# Patient Record
Sex: Male | Born: 1945 | Race: White | Hispanic: No
Health system: Southern US, Community
[De-identification: ages and names within clinical notes are randomized; demographics above are authoritative.]

## PROBLEM LIST (undated history)

## (undated) DIAGNOSIS — C801 Malignant (primary) neoplasm, unspecified: Secondary | ICD-10-CM

## (undated) HISTORY — PX: TOTAL HIP ARTHROPLASTY: SHX124

## (undated) HISTORY — PX: BRAIN SURGERY: SHX531

---

## 2022-03-01 ENCOUNTER — Encounter (HOSPITAL_BASED_OUTPATIENT_CLINIC_OR_DEPARTMENT_OTHER): Payer: Self-pay

## 2022-03-01 ENCOUNTER — Other Ambulatory Visit: Payer: Self-pay

## 2022-03-01 ENCOUNTER — Emergency Department (HOSPITAL_BASED_OUTPATIENT_CLINIC_OR_DEPARTMENT_OTHER)
Admission: EM | Admit: 2022-03-01 | Discharge: 2022-03-02 | Disposition: A | Discharge status: Home / Self Care | Payer: Medicare HMO | Attending: Emergency Medicine | Admitting: Emergency Medicine

## 2022-03-01 DIAGNOSIS — Y9389 Activity, other specified: Secondary | ICD-10-CM | POA: Insufficient documentation

## 2022-03-01 DIAGNOSIS — Y92009 Unspecified place in unspecified non-institutional (private) residence as the place of occurrence of the external cause: Secondary | ICD-10-CM | POA: Insufficient documentation

## 2022-03-01 DIAGNOSIS — S01111A Laceration without foreign body of right eyelid and periocular area, initial encounter: Secondary | ICD-10-CM | POA: Insufficient documentation

## 2022-03-01 DIAGNOSIS — S0990XA Unspecified injury of head, initial encounter: Secondary | ICD-10-CM | POA: Insufficient documentation

## 2022-03-01 DIAGNOSIS — S0591XA Unspecified injury of right eye and orbit, initial encounter: Secondary | ICD-10-CM | POA: Diagnosis present

## 2022-03-01 DIAGNOSIS — Z5321 Procedure and treatment not carried out due to patient leaving prior to being seen by health care provider: Secondary | ICD-10-CM | POA: Insufficient documentation

## 2022-03-01 DIAGNOSIS — W01198A Fall on same level from slipping, tripping and stumbling with subsequent striking against other object, initial encounter: Secondary | ICD-10-CM | POA: Insufficient documentation

## 2022-03-01 HISTORY — DX: Malignant (primary) neoplasm, unspecified: C80.1

## 2022-03-01 NOTE — ED Triage Notes (Signed)
Pt presents to the ED POV from home. States that he was taking the trash out at home when he tripped and fell. Reports hitting his head on the trash can. Small lac above right eye. Bleeding controlled at this time. No LOC. No use of blood thinners. Pt A&Ox4 at time of triage. VSS. Pupils round, reactive and equal. ?

## 2022-03-04 ENCOUNTER — Encounter (HOSPITAL_BASED_OUTPATIENT_CLINIC_OR_DEPARTMENT_OTHER): Payer: Self-pay

## 2022-03-04 ENCOUNTER — Other Ambulatory Visit: Payer: Self-pay

## 2022-03-04 ENCOUNTER — Emergency Department (HOSPITAL_BASED_OUTPATIENT_CLINIC_OR_DEPARTMENT_OTHER): Payer: Medicare HMO

## 2022-03-04 ENCOUNTER — Emergency Department (HOSPITAL_BASED_OUTPATIENT_CLINIC_OR_DEPARTMENT_OTHER)
Admission: EM | Admit: 2022-03-04 | Discharge: 2022-03-04 | Disposition: A | Discharge status: Home / Self Care | Payer: Medicare HMO | Attending: Emergency Medicine | Admitting: Emergency Medicine

## 2022-03-04 DIAGNOSIS — Z85118 Personal history of other malignant neoplasm of bronchus and lung: Secondary | ICD-10-CM | POA: Insufficient documentation

## 2022-03-04 DIAGNOSIS — Z79899 Other long term (current) drug therapy: Secondary | ICD-10-CM | POA: Insufficient documentation

## 2022-03-04 DIAGNOSIS — I10 Essential (primary) hypertension: Secondary | ICD-10-CM | POA: Diagnosis not present

## 2022-03-04 DIAGNOSIS — T07XXXA Unspecified multiple injuries, initial encounter: Secondary | ICD-10-CM

## 2022-03-04 DIAGNOSIS — S0083XA Contusion of other part of head, initial encounter: Secondary | ICD-10-CM | POA: Diagnosis not present

## 2022-03-04 DIAGNOSIS — R251 Tremor, unspecified: Secondary | ICD-10-CM | POA: Insufficient documentation

## 2022-03-04 DIAGNOSIS — Z85528 Personal history of other malignant neoplasm of kidney: Secondary | ICD-10-CM | POA: Insufficient documentation

## 2022-03-04 DIAGNOSIS — T148XXA Other injury of unspecified body region, initial encounter: Secondary | ICD-10-CM

## 2022-03-04 DIAGNOSIS — S0990XA Unspecified injury of head, initial encounter: Secondary | ICD-10-CM | POA: Diagnosis present

## 2022-03-04 DIAGNOSIS — S41111A Laceration without foreign body of right upper arm, initial encounter: Secondary | ICD-10-CM | POA: Diagnosis not present

## 2022-03-04 DIAGNOSIS — W01198A Fall on same level from slipping, tripping and stumbling with subsequent striking against other object, initial encounter: Secondary | ICD-10-CM | POA: Insufficient documentation

## 2022-03-04 MED ORDER — BACITRACIN ZINC 500 UNIT/GM EX OINT
TOPICAL_OINTMENT | Freq: Two times a day (BID) | CUTANEOUS | Status: DC
  Administered 2022-03-04: 1 via TOPICAL
  Filled 2022-03-04: qty 28.35

## 2022-03-04 NOTE — ED Provider Notes (Signed)
?[facility] ?Provider Note ? ? ?CSN: [id number] ?Arrival date & time: 03/04/22  2004 ? ?  ? ?History ? ?Chief Complaint  ?Patient presents with  ? Head Injury  ? ? ?Chad Winters is a 76 y.o. male. ? 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  Pt said he fell in the driveway on Sunday, 9/56 while he was taking out the trash.  He hit the right side of his face and also hit his right arm.  Pt did come to the ED on the 19th, but he left without being seen because the wait was too long.  Pt is not on blood thinners.   ? ? ?  ? ?Home Medications ?Prior to Admission medications   ?Not on File  ?   ?Current Outpatient Medications  ?Medication Sig Dispense Refill  ? acetaminophen (TYLENOL) 500 MG tablet Take 1 tablet (500 mg total) by mouth every 6 (six) hours as needed for Pain.  ? adalimumab (HUMIRA PEN) 40 mg/0.8 mL injection (pen kit) INJECT 40MG (1 PEN) SUBCUTANEOUSLY EVERY OTHER WEEK  ? allopurinoL (ZYLOPRIM) 100 MG tablet Take 1 tablet (100 mg total) by mouth daily. 90 tablet 3  ? amLODIPine (NORVASC) 10 MG tablet Take 1 tablet (10 mg total) by mouth daily. 90 tablet 2  ? atorvastatin (LIPITOR) 40 MG tablet Take 1 tablet (40 mg total) by mouth daily. 90 tablet 3  ? CHOLECALCIFEROL, VITAMIN D3, ORAL Take 2,000 Units by mouth daily.  ? cyanocobalamin (VITAMIN B12) 1000 MCG tablet Take 1 tablet (1,000 mcg total) by mouth daily.  ? DULoxetine (CYMBALTA) 20 MG DR capsule Take 2 capsules (40 mg total) by mouth daily. Start 1 cap for 2 weeks, increase to 2 ongoing if no improvement in headaches 60 capsule 3  ? finasteride (PROSCAR) 5 mg tablet Take 1 tablet (5 mg total) by mouth daily. 90 tablet 3  ? loperamide (IMODIUM-AD) 2 mg capsule TAKE 1 CAPSULE(2 MG) BY MOUTH FOUR TIMES DAILY AS NEEDED FOR DIARRHEA 150 capsule 3  ? ondansetron  (ZOFRAN-ODT) 4 MG disintegrating tablet Dissolve 1 tablet (4 mg total) by mouth every 8 (eight) hours as needed for Nausea. 20 tablet 5  ? topiramate (TOPAMAX) 50 MG tablet Take 1 tablet (50 mg total) by mouth 2 times daily.  ? potassium chloride ER (KLOR-CON-M, K-DUR) 20 MEQ extended release tablet Take 1 tablet (20 mEq total) by mouth daily for 7 days. 7 tablet 0  ?Allergies    ?Ciprofloxacin, Codeine, and Lisinopril   ? ?Review of Systems   ?Review of Systems  ?HENT:  Positive for facial swelling.   ?Skin:  Positive for wound.  ?All other systems reviewed and are negative. ? ?Physical Exam ?Updated Vital Signs ?BP 136/81   Pulse 60   Temp 97.9 ?F (36.6 ?C) (Oral)   Resp 18   Ht 5' 8"  (1.727 m)   Wt 62.1 kg   SpO2 100%   BMI 20.82 kg/m?  ?Physical Exam ?Vitals and nursing note reviewed.  ?Constitutional:   ?   Appearance: Normal appearance.  ?HENT:  ?   Head:  ?   Comments: Right forehead abrasions ?Right face contusions and swelling ? ?   Right Ear: External ear normal.  ?   Nose: Nose normal.  ?   Mouth/Throat:  ?   Mouth: Mucous membranes  are moist.  ?   Pharynx: Oropharynx is clear.  ?Eyes:  ?   Extraocular Movements: Extraocular movements intact.  ?   Conjunctiva/sclera: Conjunctivae normal.  ?   Pupils: Pupils are equal, round, and reactive to light.  ?Cardiovascular:  ?   Rate and Rhythm: Normal rate and regular rhythm.  ?   Pulses: Normal pulses.  ?   Heart sounds: Normal heart sounds.  ?Pulmonary:  ?   Effort: Pulmonary effort is normal.  ?   Breath sounds: Normal breath sounds.  ?Abdominal:  ?   General: Abdomen is flat. Bowel sounds are normal.  ?   Palpations: Abdomen is soft.  ?Musculoskeletal:     ?   General: Normal range of motion.  ?   Cervical back: Normal range of motion and neck supple.  ?Skin: ?   Capillary Refill: Capillary refill takes less than 2 seconds.  ?   Comments: 2 skin tears on right arm.  Healing and not infected.  ?Neurological:  ?   General: No focal deficit present.  ?    Mental Status: He is alert and oriented to person, place, and time.  ?   Motor: Tremor present.  ?   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   Mood and Affect: Mood normal.     ?   Behavior: Behavior normal.  ? ? ?ED Results / Procedures / Treatments   ?Labs ?(all labs ordered are listed, but only abnormal results are displayed) ?Labs Reviewed - No data to display ? ?EKG ?None ? ?Radiology ?CT Head Wo Contrast ? ?Result Date: 03/04/2022 ?CLINICAL DATA:  [place].  Facial bruising. EXAM: CT HEAD WITHOUT CONTRAST CT MAXILLOFACIAL WITHOUT CONTRAST CT CERVICAL SPINE WITHOUT CONTRAST TECHNIQUE: Multidetector CT imaging of the head, cervical spine, and maxillofacial structures were performed using the standard protocol without intravenous contrast. Multiplanar CT image reconstructions of the cervical spine and maxillofacial structures were also generated. RADIATION DOSE REDUCTION: This exam was performed according to the departmental dose-optimization program which includes automated exposure control, adjustment of the mA and/or kV according to patient size and/or use of iterative reconstruction technique. COMPARISON:  CT head 10/03/2021. FINDINGS: CT HEAD FINDINGS Brain: No evidence of acute infarction, hemorrhage, hydrocephalus, extra-axial collection or mass lesion/mass effect. Low-density extra-axial fluid collection in the right frontal region measuring 1 cm appears unchanged from prior. Small area of encephalomalacia in the right frontal lobe is also unchanged. There is mild diffuse atrophy. Vascular: Atherosclerotic calcifications are present within the cavernous internal carotid arteries. Skull: Old bilateral frontoparietal burr holes are present. No acute fractures are seen. Other: None. CT MAXILLOFACIAL FINDINGS Osseous: No fracture or mandibular dislocation. No destructive process. Orbits: Negative. No traumatic or inflammatory finding. Sinuses: Right mastoidectomy changes are  again noted. There is opacification residual right mastoid air cells, unchanged. Left mastoid air cells clear. Paranasal sinuses are clear. Soft tissues: There is mild soft tissue swelling and edema of the right face. There is also soft tissue swelling and edema in the right forehead and right periorbital region. There is no radiopaque foreign body. CT CERVICAL SPINE FINDINGS Alignment: Normal. Skull base and vertebrae: No acute fracture. No primary bone lesion or focal pathologic process. Soft tissues and spinal canal: No prevertebral fluid or swelling. No visible canal hematoma. Disc levels: There is disc space narrowing from C3 through C7 compatible with degenerative change. There is no severe central canal or neural foraminal stenosis identified. Upper chest:  Negative. Other: None. IMPRESSION: 1. No acute intracranial process. 2. No acute facial fracture. 3. Right facial soft tissue swelling and edema. 4. No acute fracture or malalignment of the cervical spine. 5. Unchanged small low-density extra-axial fluid collection overlying the right cerebral convexity may represent subdural hygroma. Electronically Signed   By: Ronney Asters M.D.   On: 03/04/2022 22:30  ? ?CT Cervical Spine Wo Contrast ? ?Result Date: 03/04/2022 ?CLINICAL DATA:  [place].  Facial bruising. EXAM: CT HEAD WITHOUT CONTRAST CT MAXILLOFACIAL WITHOUT CONTRAST CT CERVICAL SPINE WITHOUT CONTRAST TECHNIQUE: Multidetector CT imaging of the head, cervical spine, and maxillofacial structures were performed using the standard protocol without intravenous contrast. Multiplanar CT image reconstructions of the cervical spine and maxillofacial structures were also generated. RADIATION DOSE REDUCTION: This exam was performed according to the departmental dose-optimization program which includes automated exposure control, adjustment of the mA and/or kV according to patient size and/or use of iterative reconstruction technique. COMPARISON:  CT head 10/03/2021.  FINDINGS: CT HEAD FINDINGS Brain: No evidence of acute infarction, hemorrhage, hydrocephalus, extra-axial collection or mass lesion/mass effect. Low-density extra-axial fluid collection in the right frontal region measurin

## 2022-03-04 NOTE — ED Notes (Signed)
Patient given discharge instructions. Questions were answered. Patient verbalized understanding of discharge instructions and care at home. ? ?Patient discharged at this time.  ?

## 2022-03-04 NOTE — ED Notes (Signed)
Patient abrasion sites to arm and head cleansed with saline, covered with bacitracin, and covered in gauzed. Arm sites wrapped with coban.  ?

## 2022-03-04 NOTE — ED Triage Notes (Signed)
Patient here POV from Home with Head Injury.  ? ?Patient fell on Sunday when he was taking the Trash out. ? ?Abrasion noted to Right Forehead and Bruising noted to Facial Area. ? ?Patient was here Sunday but LWBS due to Wait. No LOC. No Anticoagulants.  ? ?NAD Noted during Triage. A&Ox4. GCS 15. Ambulatory. ?

## 2022-12-15 DIAGNOSIS — Z961 Presence of intraocular lens: Secondary | ICD-10-CM | POA: Diagnosis not present

## 2022-12-15 DIAGNOSIS — Z9841 Cataract extraction status, right eye: Secondary | ICD-10-CM | POA: Diagnosis not present

## 2022-12-15 DIAGNOSIS — G510 Bell's palsy: Secondary | ICD-10-CM | POA: Diagnosis not present

## 2022-12-15 DIAGNOSIS — H20023 Recurrent acute iridocyclitis, bilateral: Secondary | ICD-10-CM | POA: Diagnosis not present

## 2022-12-15 DIAGNOSIS — H25812 Combined forms of age-related cataract, left eye: Secondary | ICD-10-CM | POA: Diagnosis not present

## 2022-12-15 DIAGNOSIS — H35372 Puckering of macula, left eye: Secondary | ICD-10-CM | POA: Diagnosis not present

## 2022-12-16 ENCOUNTER — Other Ambulatory Visit: Payer: Self-pay

## 2022-12-16 ENCOUNTER — Emergency Department (HOSPITAL_COMMUNITY): Payer: Medicare HMO

## 2022-12-16 ENCOUNTER — Encounter (HOSPITAL_COMMUNITY)
Admission: EM | Disposition: A | Discharge status: Home Health | Payer: Self-pay | Source: Home / Self Care | Attending: Internal Medicine

## 2022-12-16 ENCOUNTER — Encounter (HOSPITAL_COMMUNITY): Payer: Self-pay | Admitting: Internal Medicine

## 2022-12-16 ENCOUNTER — Inpatient Hospital Stay (HOSPITAL_COMMUNITY): Payer: Medicare HMO

## 2022-12-16 ENCOUNTER — Inpatient Hospital Stay (HOSPITAL_COMMUNITY): Payer: Medicare HMO | Admitting: Anesthesiology

## 2022-12-16 ENCOUNTER — Inpatient Hospital Stay (HOSPITAL_COMMUNITY)
Admission: EM | Admit: 2022-12-16 | Discharge: 2022-12-20 | DRG: 522 | Disposition: A | Discharge status: Home Health | Payer: Medicare HMO | Attending: Internal Medicine | Admitting: Internal Medicine

## 2022-12-16 DIAGNOSIS — I129 Hypertensive chronic kidney disease with stage 1 through stage 4 chronic kidney disease, or unspecified chronic kidney disease: Secondary | ICD-10-CM | POA: Diagnosis present

## 2022-12-16 DIAGNOSIS — N179 Acute kidney failure, unspecified: Secondary | ICD-10-CM | POA: Diagnosis not present

## 2022-12-16 DIAGNOSIS — S72001A Fracture of unspecified part of neck of right femur, initial encounter for closed fracture: Secondary | ICD-10-CM

## 2022-12-16 DIAGNOSIS — M450 Ankylosing spondylitis of multiple sites in spine: Secondary | ICD-10-CM

## 2022-12-16 DIAGNOSIS — W1830XA Fall on same level, unspecified, initial encounter: Secondary | ICD-10-CM | POA: Diagnosis present

## 2022-12-16 DIAGNOSIS — Y92008 Other place in unspecified non-institutional (private) residence as the place of occurrence of the external cause: Secondary | ICD-10-CM | POA: Diagnosis not present

## 2022-12-16 DIAGNOSIS — R531 Weakness: Secondary | ICD-10-CM | POA: Diagnosis not present

## 2022-12-16 DIAGNOSIS — Z85528 Personal history of other malignant neoplasm of kidney: Secondary | ICD-10-CM | POA: Diagnosis not present

## 2022-12-16 DIAGNOSIS — K7581 Nonalcoholic steatohepatitis (NASH): Secondary | ICD-10-CM | POA: Diagnosis present

## 2022-12-16 DIAGNOSIS — Z885 Allergy status to narcotic agent status: Secondary | ICD-10-CM

## 2022-12-16 DIAGNOSIS — E876 Hypokalemia: Secondary | ICD-10-CM | POA: Diagnosis not present

## 2022-12-16 DIAGNOSIS — M25551 Pain in right hip: Secondary | ICD-10-CM | POA: Diagnosis not present

## 2022-12-16 DIAGNOSIS — S72041A Displaced fracture of base of neck of right femur, initial encounter for closed fracture: Secondary | ICD-10-CM

## 2022-12-16 DIAGNOSIS — M109 Gout, unspecified: Secondary | ICD-10-CM | POA: Diagnosis present

## 2022-12-16 DIAGNOSIS — Z888 Allergy status to other drugs, medicaments and biological substances status: Secondary | ICD-10-CM

## 2022-12-16 DIAGNOSIS — Z96641 Presence of right artificial hip joint: Secondary | ICD-10-CM | POA: Diagnosis not present

## 2022-12-16 DIAGNOSIS — Z79620 Long term (current) use of immunosuppressive biologic: Secondary | ICD-10-CM

## 2022-12-16 DIAGNOSIS — F17210 Nicotine dependence, cigarettes, uncomplicated: Secondary | ICD-10-CM | POA: Diagnosis not present

## 2022-12-16 DIAGNOSIS — F17290 Nicotine dependence, other tobacco product, uncomplicated: Secondary | ICD-10-CM | POA: Diagnosis present

## 2022-12-16 DIAGNOSIS — Z96642 Presence of left artificial hip joint: Secondary | ICD-10-CM | POA: Diagnosis present

## 2022-12-16 DIAGNOSIS — E871 Hypo-osmolality and hyponatremia: Secondary | ICD-10-CM | POA: Diagnosis not present

## 2022-12-16 DIAGNOSIS — I10 Essential (primary) hypertension: Secondary | ICD-10-CM | POA: Insufficient documentation

## 2022-12-16 DIAGNOSIS — T433X5A Adverse effect of phenothiazine antipsychotics and neuroleptics, initial encounter: Secondary | ICD-10-CM | POA: Diagnosis present

## 2022-12-16 DIAGNOSIS — S72009A Fracture of unspecified part of neck of unspecified femur, initial encounter for closed fracture: Secondary | ICD-10-CM | POA: Insufficient documentation

## 2022-12-16 DIAGNOSIS — N1832 Chronic kidney disease, stage 3b: Secondary | ICD-10-CM | POA: Diagnosis not present

## 2022-12-16 DIAGNOSIS — G2119 Other drug induced secondary parkinsonism: Secondary | ICD-10-CM | POA: Diagnosis present

## 2022-12-16 DIAGNOSIS — Z881 Allergy status to other antibiotic agents status: Secondary | ICD-10-CM | POA: Diagnosis not present

## 2022-12-16 DIAGNOSIS — E785 Hyperlipidemia, unspecified: Secondary | ICD-10-CM | POA: Diagnosis not present

## 2022-12-16 DIAGNOSIS — Z471 Aftercare following joint replacement surgery: Secondary | ICD-10-CM | POA: Diagnosis not present

## 2022-12-16 DIAGNOSIS — D849 Immunodeficiency, unspecified: Secondary | ICD-10-CM | POA: Diagnosis not present

## 2022-12-16 DIAGNOSIS — S72031A Displaced midcervical fracture of right femur, initial encounter for closed fracture: Secondary | ICD-10-CM | POA: Diagnosis not present

## 2022-12-16 DIAGNOSIS — M459 Ankylosing spondylitis of unspecified sites in spine: Secondary | ICD-10-CM

## 2022-12-16 DIAGNOSIS — Z8669 Personal history of other diseases of the nervous system and sense organs: Secondary | ICD-10-CM

## 2022-12-16 DIAGNOSIS — F17200 Nicotine dependence, unspecified, uncomplicated: Secondary | ICD-10-CM | POA: Diagnosis not present

## 2022-12-16 DIAGNOSIS — Z01818 Encounter for other preprocedural examination: Secondary | ICD-10-CM | POA: Diagnosis not present

## 2022-12-16 DIAGNOSIS — Z905 Acquired absence of kidney: Secondary | ICD-10-CM

## 2022-12-16 DIAGNOSIS — M1990 Unspecified osteoarthritis, unspecified site: Secondary | ICD-10-CM

## 2022-12-16 DIAGNOSIS — W19XXXA Unspecified fall, initial encounter: Secondary | ICD-10-CM | POA: Diagnosis not present

## 2022-12-16 DIAGNOSIS — M25572 Pain in left ankle and joints of left foot: Secondary | ICD-10-CM | POA: Diagnosis not present

## 2022-12-16 DIAGNOSIS — M954 Acquired deformity of chest and rib: Secondary | ICD-10-CM | POA: Diagnosis not present

## 2022-12-16 HISTORY — PX: TOTAL HIP ARTHROPLASTY: SHX124

## 2022-12-16 LAB — CBC WITH DIFFERENTIAL/PLATELET
Abs Immature Granulocytes: 0.15 10*3/uL — ABNORMAL HIGH (ref 0.00–0.07)
Basophils Absolute: 0 10*3/uL (ref 0.0–0.1)
Basophils Relative: 0 %
Eosinophils Absolute: 0 10*3/uL (ref 0.0–0.5)
Eosinophils Relative: 0 %
HCT: 36.1 % — ABNORMAL LOW (ref 39.0–52.0)
Hemoglobin: 12.2 g/dL — ABNORMAL LOW (ref 13.0–17.0)
Immature Granulocytes: 1 %
Lymphocytes Relative: 8 %
Lymphs Abs: 0.9 10*3/uL (ref 0.7–4.0)
MCH: 31.9 pg (ref 26.0–34.0)
MCHC: 33.8 g/dL (ref 30.0–36.0)
MCV: 94.3 fL (ref 80.0–100.0)
Monocytes Absolute: 0.9 10*3/uL (ref 0.1–1.0)
Monocytes Relative: 8 %
Neutro Abs: 8.7 10*3/uL — ABNORMAL HIGH (ref 1.7–7.7)
Neutrophils Relative %: 83 %
Platelets: 341 10*3/uL (ref 150–400)
RBC: 3.83 MIL/uL — ABNORMAL LOW (ref 4.22–5.81)
RDW: 14.6 % (ref 11.5–15.5)
WBC: 10.6 10*3/uL — ABNORMAL HIGH (ref 4.0–10.5)
nRBC: 0 % (ref 0.0–0.2)

## 2022-12-16 LAB — MAGNESIUM: Magnesium: 1.7 mg/dL (ref 1.7–2.4)

## 2022-12-16 LAB — POCT I-STAT, CHEM 8
BUN: 16 mg/dL (ref 8–23)
Calcium, Ion: 1.17 mmol/L (ref 1.15–1.40)
Chloride: 103 mmol/L (ref 98–111)
Creatinine, Ser: 1 mg/dL (ref 0.61–1.24)
Glucose, Bld: 98 mg/dL (ref 70–99)
HCT: 37 % — ABNORMAL LOW (ref 39.0–52.0)
Hemoglobin: 12.6 g/dL — ABNORMAL LOW (ref 13.0–17.0)
Potassium: 3.3 mmol/L — ABNORMAL LOW (ref 3.5–5.1)
Sodium: 139 mmol/L (ref 135–145)
TCO2: 21 mmol/L — ABNORMAL LOW (ref 22–32)

## 2022-12-16 LAB — BASIC METABOLIC PANEL
Anion gap: 10 (ref 5–15)
BUN: 19 mg/dL (ref 8–23)
CO2: 23 mmol/L (ref 22–32)
Calcium: 8.3 mg/dL — ABNORMAL LOW (ref 8.9–10.3)
Chloride: 103 mmol/L (ref 98–111)
Creatinine, Ser: 1.17 mg/dL (ref 0.61–1.24)
GFR, Estimated: >60 mL/min (ref >60)
Glucose, Bld: 110 mg/dL — ABNORMAL HIGH (ref 70–99)
Potassium: 2.8 mmol/L — ABNORMAL LOW (ref 3.5–5.1)
Sodium: 136 mmol/L (ref 135–145)

## 2022-12-16 LAB — SURGICAL PCR SCREEN
MRSA, PCR: NEGATIVE
Staphylococcus aureus: NEGATIVE

## 2022-12-16 LAB — TYPE AND SCREEN
ABO/RH(D): A POS
Antibody Screen: NEGATIVE

## 2022-12-16 LAB — ABO/RH: ABO/RH(D): A POS

## 2022-12-16 SURGERY — ARTHROPLASTY, HIP, TOTAL, ANTERIOR APPROACH
Anesthesia: General | Site: Hip | Laterality: Right

## 2022-12-16 MED ORDER — PHENOL 1.4 % MT LIQD: 1.0000 | OROMUCOSAL | Status: DC | PRN

## 2022-12-16 MED ORDER — ACETAMINOPHEN 10 MG/ML IV SOLN: 1000.0000 mg | Freq: Once | INTRAVENOUS | Status: DC | PRN

## 2022-12-16 MED ORDER — SODIUM CHLORIDE 0.9 % IR SOLN
Status: DC | PRN
  Administered 2022-12-16: 3000 mL

## 2022-12-16 MED ORDER — HYDRALAZINE HCL 20 MG/ML IJ SOLN
5.0000 mg | Freq: Three times a day (TID) | INTRAMUSCULAR | Status: DC | PRN
  Filled 2022-12-16: qty 0.25

## 2022-12-16 MED ORDER — OXYCODONE HCL 5 MG PO TABS
5.0000 mg | ORAL_TABLET | ORAL | Status: DC | PRN
  Administered 2022-12-17 – 2022-12-18 (×5): 10 mg via ORAL
  Administered 2022-12-18: 5 mg via ORAL
  Administered 2022-12-18 – 2022-12-19 (×2): 10 mg via ORAL
  Administered 2022-12-19: 5 mg via ORAL
  Administered 2022-12-19: 10 mg via ORAL
  Administered 2022-12-19: 5 mg via ORAL
  Administered 2022-12-20 (×2): 10 mg via ORAL
  Filled 2022-12-16 (×2): qty 1
  Filled 2022-12-16 (×8): qty 2
  Filled 2022-12-16: qty 1
  Filled 2022-12-16 (×2): qty 2

## 2022-12-16 MED ORDER — METOCLOPRAMIDE HCL 5 MG/ML IJ SOLN: 5.0000 mg | Freq: Three times a day (TID) | INTRAMUSCULAR | Status: DC | PRN

## 2022-12-16 MED ORDER — CHLORHEXIDINE GLUCONATE 4 % EX LIQD: 60.0000 mL | Freq: Once | CUTANEOUS | Status: DC

## 2022-12-16 MED ORDER — 0.9 % SODIUM CHLORIDE (POUR BTL) OPTIME
TOPICAL | Status: DC | PRN
  Administered 2022-12-16: 1000 mL

## 2022-12-16 MED ORDER — OXYCODONE-ACETAMINOPHEN 5-325 MG PO TABS: 1.0000 | ORAL_TABLET | Freq: Once | ORAL | Status: DC

## 2022-12-16 MED ORDER — FENTANYL CITRATE (PF) 250 MCG/5ML IJ SOLN
INTRAMUSCULAR | Status: DC | PRN
  Administered 2022-12-16: 25 ug via INTRAVENOUS
  Administered 2022-12-16 (×3): 50 ug via INTRAVENOUS
  Administered 2022-12-16: 75 ug via INTRAVENOUS

## 2022-12-16 MED ORDER — DOCUSATE SODIUM 100 MG PO CAPS
100.0000 mg | ORAL_CAPSULE | Freq: Two times a day (BID) | ORAL | Status: DC
  Administered 2022-12-17 – 2022-12-19 (×6): 100 mg via ORAL
  Filled 2022-12-16 (×6): qty 1

## 2022-12-16 MED ORDER — POTASSIUM CHLORIDE 10 MEQ/100ML IV SOLN
INTRAVENOUS | Status: AC
  Administered 2022-12-16: 10 meq via INTRAVENOUS
  Filled 2022-12-16: qty 200

## 2022-12-16 MED ORDER — POTASSIUM CHLORIDE CRYS ER 20 MEQ PO TBCR
40.0000 meq | EXTENDED_RELEASE_TABLET | Freq: Once | ORAL | Status: DC
  Filled 2022-12-16 (×2): qty 2

## 2022-12-16 MED ORDER — SODIUM CHLORIDE 0.9 % IV SOLN: INTRAVENOUS | Status: DC | PRN

## 2022-12-16 MED ORDER — SUGAMMADEX SODIUM 200 MG/2ML IV SOLN
INTRAVENOUS | Status: DC | PRN
  Administered 2022-12-16: 200 mg via INTRAVENOUS

## 2022-12-16 MED ORDER — VANCOMYCIN HCL 1000 MG IV SOLR
INTRAVENOUS | Status: DC | PRN
  Administered 2022-12-16: 1000 mg

## 2022-12-16 MED ORDER — SUCCINYLCHOLINE CHLORIDE 200 MG/10ML IV SOSY
PREFILLED_SYRINGE | INTRAVENOUS | Status: DC | PRN
  Administered 2022-12-16: 140 mg via INTRAVENOUS

## 2022-12-16 MED ORDER — POTASSIUM CHLORIDE 10 MEQ/100ML IV SOLN
10.0000 meq | INTRAVENOUS | Status: AC
  Filled 2022-12-16 (×3): qty 100

## 2022-12-16 MED ORDER — METHOCARBAMOL 1000 MG/10ML IJ SOLN: 500.0000 mg | Freq: Four times a day (QID) | INTRAVENOUS | Status: DC | PRN

## 2022-12-16 MED ORDER — VANCOMYCIN HCL 1000 MG IV SOLR
INTRAVENOUS | Status: AC
  Filled 2022-12-16: qty 20

## 2022-12-16 MED ORDER — CEFAZOLIN SODIUM-DEXTROSE 2-4 GM/100ML-% IV SOLN
2.0000 g | Freq: Three times a day (TID) | INTRAVENOUS | Status: AC
  Administered 2022-12-17 (×2): 2 g via INTRAVENOUS
  Filled 2022-12-16 (×2): qty 100

## 2022-12-16 MED ORDER — TRANEXAMIC ACID-NACL 1000-0.7 MG/100ML-% IV SOLN
1000.0000 mg | Freq: Once | INTRAVENOUS | Status: AC
  Administered 2022-12-17: 1000 mg via INTRAVENOUS
  Filled 2022-12-16: qty 100

## 2022-12-16 MED ORDER — CEFAZOLIN SODIUM-DEXTROSE 2-4 GM/100ML-% IV SOLN
INTRAVENOUS | Status: AC
  Filled 2022-12-16: qty 100

## 2022-12-16 MED ORDER — POVIDONE-IODINE 10 % EX SWAB
2.0000 | Freq: Once | CUTANEOUS | Status: AC
  Administered 2022-12-16: 2 via TOPICAL

## 2022-12-16 MED ORDER — BUPIVACAINE HCL (PF) 0.25 % IJ SOLN
INTRAMUSCULAR | Status: DC | PRN
  Administered 2022-12-16: 30 mL

## 2022-12-16 MED ORDER — MENTHOL 3 MG MT LOZG: 1.0000 | LOZENGE | OROMUCOSAL | Status: DC | PRN

## 2022-12-16 MED ORDER — MORPHINE SULFATE 4 MG/ML IJ SOLN
INTRAMUSCULAR | Status: DC | PRN
  Administered 2022-12-16: 8 mg via INTRAMUSCULAR

## 2022-12-16 MED ORDER — FENTANYL CITRATE (PF) 250 MCG/5ML IJ SOLN
INTRAMUSCULAR | Status: AC
  Filled 2022-12-16: qty 5

## 2022-12-16 MED ORDER — LIDOCAINE 2% (20 MG/ML) 5 ML SYRINGE
INTRAMUSCULAR | Status: AC
  Filled 2022-12-16: qty 5

## 2022-12-16 MED ORDER — FENTANYL CITRATE (PF) 100 MCG/2ML IJ SOLN
25.0000 ug | INTRAMUSCULAR | Status: DC | PRN
  Administered 2022-12-16: 25 ug via INTRAVENOUS

## 2022-12-16 MED ORDER — CHLORHEXIDINE GLUCONATE 0.12 % MT SOLN: 15.0000 mL | Freq: Once | OROMUCOSAL | Status: AC

## 2022-12-16 MED ORDER — CELECOXIB 100 MG PO CAPS
100.0000 mg | ORAL_CAPSULE | Freq: Two times a day (BID) | ORAL | Status: DC
  Administered 2022-12-17 – 2022-12-18 (×4): 100 mg via ORAL
  Filled 2022-12-16 (×4): qty 1

## 2022-12-16 MED ORDER — CLONIDINE HCL (ANALGESIA) 100 MCG/ML EP SOLN
EPIDURAL | Status: AC
  Filled 2022-12-16: qty 10

## 2022-12-16 MED ORDER — SUCCINYLCHOLINE CHLORIDE 200 MG/10ML IV SOSY
PREFILLED_SYRINGE | INTRAVENOUS | Status: AC
  Filled 2022-12-16: qty 10

## 2022-12-16 MED ORDER — PROPOFOL 10 MG/ML IV BOLUS
INTRAVENOUS | Status: DC | PRN
  Administered 2022-12-16: 110 mg via INTRAVENOUS

## 2022-12-16 MED ORDER — LACTATED RINGERS IV SOLN: INTRAVENOUS | Status: DC

## 2022-12-16 MED ORDER — DEXAMETHASONE SODIUM PHOSPHATE 10 MG/ML IJ SOLN
INTRAMUSCULAR | Status: DC | PRN
  Administered 2022-12-16: 5 mg via INTRAVENOUS

## 2022-12-16 MED ORDER — ONDANSETRON HCL 4 MG/2ML IJ SOLN
INTRAMUSCULAR | Status: AC
  Filled 2022-12-16: qty 2

## 2022-12-16 MED ORDER — ONDANSETRON HCL 4 MG/2ML IJ SOLN
4.0000 mg | Freq: Four times a day (QID) | INTRAMUSCULAR | Status: DC | PRN
  Administered 2022-12-17: 4 mg via INTRAVENOUS
  Filled 2022-12-16: qty 2

## 2022-12-16 MED ORDER — ORAL CARE MOUTH RINSE: 15.0000 mL | Freq: Once | OROMUCOSAL | Status: AC

## 2022-12-16 MED ORDER — ROCURONIUM BROMIDE 10 MG/ML (PF) SYRINGE
PREFILLED_SYRINGE | INTRAVENOUS | Status: DC | PRN
  Administered 2022-12-16: 20 mg via INTRAVENOUS
  Administered 2022-12-16: 50 mg via INTRAVENOUS
  Administered 2022-12-16: 20 mg via INTRAVENOUS

## 2022-12-16 MED ORDER — LIDOCAINE 2% (20 MG/ML) 5 ML SYRINGE
INTRAMUSCULAR | Status: DC | PRN
  Administered 2022-12-16: 40 mg via INTRAVENOUS

## 2022-12-16 MED ORDER — ONDANSETRON HCL 4 MG/2ML IJ SOLN
INTRAMUSCULAR | Status: DC | PRN
  Administered 2022-12-16: 4 mg via INTRAVENOUS

## 2022-12-16 MED ORDER — HYDROMORPHONE HCL 1 MG/ML IJ SOLN: 0.5000 mg | INTRAMUSCULAR | Status: DC | PRN

## 2022-12-16 MED ORDER — POTASSIUM CHLORIDE 10 MEQ/100ML IV SOLN
10.0000 meq | INTRAVENOUS | Status: AC
  Administered 2022-12-16 (×2): 10 meq via INTRAVENOUS
  Filled 2022-12-16: qty 100

## 2022-12-16 MED ORDER — CLONIDINE HCL (ANALGESIA) 100 MCG/ML EP SOLN
EPIDURAL | Status: DC | PRN
  Administered 2022-12-16: 1 mL via INTRA_ARTICULAR

## 2022-12-16 MED ORDER — PHENYLEPHRINE 80 MCG/ML (10ML) SYRINGE FOR IV PUSH (FOR BLOOD PRESSURE SUPPORT)
PREFILLED_SYRINGE | INTRAVENOUS | Status: AC
  Filled 2022-12-16: qty 10

## 2022-12-16 MED ORDER — ACETAMINOPHEN 500 MG PO TABS
1000.0000 mg | ORAL_TABLET | Freq: Four times a day (QID) | ORAL | Status: AC
  Administered 2022-12-17 (×3): 1000 mg via ORAL
  Filled 2022-12-16 (×4): qty 2

## 2022-12-16 MED ORDER — FENTANYL CITRATE (PF) 100 MCG/2ML IJ SOLN
INTRAMUSCULAR | Status: AC
  Administered 2022-12-16: 50 ug
  Filled 2022-12-16: qty 2

## 2022-12-16 MED ORDER — MORPHINE SULFATE (PF) 4 MG/ML IV SOLN
INTRAVENOUS | Status: AC
  Filled 2022-12-16: qty 2

## 2022-12-16 MED ORDER — IRRISEPT - 450ML BOTTLE WITH 0.05% CHG IN STERILE WATER, USP 99.95% OPTIME
TOPICAL | Status: DC | PRN
  Administered 2022-12-16: 450 mL

## 2022-12-16 MED ORDER — FENTANYL CITRATE PF 50 MCG/ML IJ SOSY: 50.0000 ug | PREFILLED_SYRINGE | Freq: Once | INTRAMUSCULAR | Status: DC

## 2022-12-16 MED ORDER — TRANEXAMIC ACID-NACL 1000-0.7 MG/100ML-% IV SOLN
INTRAVENOUS | Status: AC
  Filled 2022-12-16: qty 100

## 2022-12-16 MED ORDER — FENTANYL CITRATE (PF) 100 MCG/2ML IJ SOLN
INTRAMUSCULAR | Status: AC
  Filled 2022-12-16: qty 2

## 2022-12-16 MED ORDER — ROCURONIUM BROMIDE 10 MG/ML (PF) SYRINGE
PREFILLED_SYRINGE | INTRAVENOUS | Status: AC
  Filled 2022-12-16: qty 10

## 2022-12-16 MED ORDER — ONDANSETRON HCL 4 MG PO TABS
4.0000 mg | ORAL_TABLET | Freq: Four times a day (QID) | ORAL | Status: DC | PRN
  Administered 2022-12-18: 4 mg via ORAL
  Filled 2022-12-16: qty 1

## 2022-12-16 MED ORDER — CEFAZOLIN SODIUM-DEXTROSE 2-4 GM/100ML-% IV SOLN
2.0000 g | INTRAVENOUS | Status: AC
  Administered 2022-12-16: 2 g via INTRAVENOUS

## 2022-12-16 MED ORDER — PHENYLEPHRINE 80 MCG/ML (10ML) SYRINGE FOR IV PUSH (FOR BLOOD PRESSURE SUPPORT)
PREFILLED_SYRINGE | INTRAVENOUS | Status: DC | PRN
  Administered 2022-12-16 (×6): 80 ug via INTRAVENOUS

## 2022-12-16 MED ORDER — FENTANYL CITRATE PF 50 MCG/ML IJ SOSY
50.0000 ug | PREFILLED_SYRINGE | Freq: Once | INTRAMUSCULAR | Status: AC
  Administered 2022-12-16: 50 ug via INTRAVENOUS
  Filled 2022-12-16: qty 1

## 2022-12-16 MED ORDER — POTASSIUM CHLORIDE 2 MEQ/ML IV SOLN
INTRAVENOUS | Status: DC
  Filled 2022-12-16 (×3): qty 1000

## 2022-12-16 MED ORDER — ACETAMINOPHEN 325 MG PO TABS: 325.0000 mg | ORAL_TABLET | Freq: Four times a day (QID) | ORAL | Status: DC | PRN

## 2022-12-16 MED ORDER — DEXAMETHASONE SODIUM PHOSPHATE 10 MG/ML IJ SOLN
INTRAMUSCULAR | Status: AC
  Filled 2022-12-16: qty 1

## 2022-12-16 MED ORDER — METOCLOPRAMIDE HCL 5 MG PO TABS: 5.0000 mg | ORAL_TABLET | Freq: Three times a day (TID) | ORAL | Status: DC | PRN

## 2022-12-16 MED ORDER — TRANEXAMIC ACID-NACL 1000-0.7 MG/100ML-% IV SOLN
1000.0000 mg | INTRAVENOUS | Status: AC
  Administered 2022-12-16: 1000 mg via INTRAVENOUS

## 2022-12-16 MED ORDER — METHOCARBAMOL 500 MG PO TABS
500.0000 mg | ORAL_TABLET | Freq: Four times a day (QID) | ORAL | Status: DC | PRN
  Administered 2022-12-17 – 2022-12-19 (×4): 500 mg via ORAL
  Filled 2022-12-16 (×4): qty 1

## 2022-12-16 MED ORDER — BUPIVACAINE HCL (PF) 0.25 % IJ SOLN
INTRAMUSCULAR | Status: AC
  Filled 2022-12-16: qty 30

## 2022-12-16 MED ORDER — CHLORHEXIDINE GLUCONATE 0.12 % MT SOLN
OROMUCOSAL | Status: AC
  Administered 2022-12-16: 15 mL via OROMUCOSAL
  Filled 2022-12-16: qty 15

## 2022-12-16 MED ORDER — ASPIRIN 81 MG PO TBEC
81.0000 mg | DELAYED_RELEASE_TABLET | Freq: Two times a day (BID) | ORAL | Status: DC
  Administered 2022-12-17 – 2022-12-20 (×7): 81 mg via ORAL
  Filled 2022-12-16 (×8): qty 1

## 2022-12-16 SURGICAL SUPPLY — 58 items
ARTICULEZE HEAD (Hips) ×1 IMPLANT
BAG COUNTER SPONGE SURGICOUNT (BAG) ×1 IMPLANT
BAG DECANTER FOR FLEXI CONT (MISCELLANEOUS) ×1 IMPLANT
BLADE CLIPPER SURG (BLADE) ×1 IMPLANT
BLADE SAW SGTL 18X1.27X75 (BLADE) ×1 IMPLANT
CELLS DAT CNTRL 66122 CELL SVR (MISCELLANEOUS) ×1 IMPLANT
COOLER ICEMAN CLASSIC (MISCELLANEOUS) ×1 IMPLANT
COVER PERINEAL POST (MISCELLANEOUS) ×1 IMPLANT
COVER SURGICAL LIGHT HANDLE (MISCELLANEOUS) ×1 IMPLANT
DRAPE C-ARM 42X72 X-RAY (DRAPES) ×1 IMPLANT
DRAPE STERI IOBAN 125X83 (DRAPES) ×1 IMPLANT
DRAPE U-SHAPE 47X51 STRL (DRAPES) ×3 IMPLANT
DRSG AQUACEL AG ADV 3.5X10 (GAUZE/BANDAGES/DRESSINGS) ×1 IMPLANT
DURAPREP 26ML APPLICATOR (WOUND CARE) ×1 IMPLANT
ELECT BLADE 4.0 EZ CLEAN MEGAD (MISCELLANEOUS) ×1 IMPLANT
ELECT REM PT RETURN 9FT ADLT (ELECTROSURGICAL) ×1 IMPLANT
ELECTRODE BLDE 4.0 EZ CLN MEGD (MISCELLANEOUS) ×1 IMPLANT
ELECTRODE REM PT RTRN 9FT ADLT (ELECTROSURGICAL) ×1 IMPLANT
GLOVE BIOGEL PI IND STRL 8 (GLOVE) ×1 IMPLANT
GLOVE ECLIPSE 8.0 STRL XLNG CF (GLOVE) ×2 IMPLANT
GOWN STRL REUS W/ TWL LRG LVL3 (GOWN DISPOSABLE) ×3 IMPLANT
GOWN STRL REUS W/TWL LRG LVL3 (GOWN DISPOSABLE) ×3
HANDPIECE INTERPULSE COAX TIP (DISPOSABLE) ×1
HEAD ARTICULEZE (Hips) IMPLANT
HOOD PEEL AWAY FLYTE STAYCOOL (MISCELLANEOUS) ×3 IMPLANT
JET LAVAGE IRRISEPT WOUND (IRRIGATION / IRRIGATOR) ×1 IMPLANT
KIT BASIN OR (CUSTOM PROCEDURE TRAY) ×1 IMPLANT
KIT TURNOVER KIT B (KITS) ×1 IMPLANT
LAVAGE JET IRRISEPT WOUND (IRRIGATION / IRRIGATOR) ×1 IMPLANT
LINER NEUTRAL 52X36MM PLUS 4 (Liner) IMPLANT
NEEDLE HYPO 22GX1.5 SAFETY (NEEDLE) ×1 IMPLANT
NS IRRIG 1000ML POUR BTL (IV SOLUTION) ×1 IMPLANT
PACK TOTAL JOINT (CUSTOM PROCEDURE TRAY) ×1 IMPLANT
PAD ARMBOARD 7.5X6 YLW CONV (MISCELLANEOUS) ×1 IMPLANT
PAD COLD SHLDR WRAP-ON (PAD) ×1 IMPLANT
PIN SECTOR W/GRIP ACE CUP 52MM (Hips) IMPLANT
RETRACTOR WND ALEXIS 18 MED (MISCELLANEOUS) ×1 IMPLANT
RTRCTR WOUND ALEXIS 18CM MED (MISCELLANEOUS) ×1 IMPLANT
SCREW 6.5MMX25MM (Screw) IMPLANT
SCREW PINN CAN BONE 6.5MMX15MM (Screw) IMPLANT
SET HNDPC FAN SPRY TIP SCT (DISPOSABLE) ×1 IMPLANT
STEM FEMORAL SZ 6MM STD ACTIS (Stem) IMPLANT
STRIP CLOSURE SKIN 1/2X4 (GAUZE/BANDAGES/DRESSINGS) ×1 IMPLANT
SUT ETHIBOND NAB CT1 #1 30IN (SUTURE) ×2 IMPLANT
SUT MNCRL AB 3-0 PS2 18 (SUTURE) ×1 IMPLANT
SUT VIC AB 0 CT1 27 (SUTURE) ×3
SUT VIC AB 0 CT1 27XBRD ANBCTR (SUTURE) ×3 IMPLANT
SUT VIC AB 1 CT1 27 (SUTURE) ×7
SUT VIC AB 1 CT1 27XBRD ANBCTR (SUTURE) ×3 IMPLANT
SUT VIC AB 2-0 CT1 27 (SUTURE) ×2
SUT VIC AB 2-0 CT1 TAPERPNT 27 (SUTURE) ×2 IMPLANT
SYR 30ML LL (SYRINGE) ×1 IMPLANT
TAPE STRIPS DRAPE STRL (GAUZE/BANDAGES/DRESSINGS) IMPLANT
TOWEL GREEN STERILE (TOWEL DISPOSABLE) ×1 IMPLANT
TOWEL GREEN STERILE FF (TOWEL DISPOSABLE) ×1 IMPLANT
TRAY CATH INTERMITTENT SS 16FR (CATHETERS) IMPLANT
TRAY FOLEY MTR SLVR 16FR STAT (SET/KITS/TRAYS/PACK) ×1 IMPLANT
WATER STERILE IRR 1000ML POUR (IV SOLUTION) ×1 IMPLANT

## 2022-12-16 NOTE — Anesthesia Procedure Notes (Signed)
Procedure Name: Intubation Date/Time: 12/16/2022 7:33 PM  Performed by: Reece Agar, CRNAPre-anesthesia Checklist: Patient identified, Emergency Drugs available, Suction available and Patient being monitored Patient Re-evaluated:Patient Re-evaluated prior to induction Oxygen Delivery Method: Circle System Utilized Preoxygenation: Pre-oxygenation with 100% oxygen Induction Type: IV induction, Rapid sequence and Cricoid Pressure applied Ventilation: Mask ventilation without difficulty Laryngoscope Size: Mac and 4 Grade View: Grade II Tube type: Oral Tube size: 7.5 mm Number of attempts: 1 Airway Equipment and Method: Stylet Placement Confirmation: ETT inserted through vocal cords under direct vision, positive ETCO2 and breath sounds checked- equal and bilateral Secured at: 22 cm Tube secured with: Tape Dental Injury: Teeth and Oropharynx as per pre-operative assessment

## 2022-12-16 NOTE — ED Provider Notes (Signed)
[facility] DEPT Provider Note   CSN: [id number] Arrival date & time: 12/16/22  1208     History  Chief Complaint  Patient presents with   Chad Winters is a 77 y.o. male senting today after mechanical fall.  He reports that he got up in his home and fell onto his right hip.  Does not believe he got dizzy, presyncopal or have any palpitations prior to the fall.  States that he was ambulated back from the restroom and believes that his foot got stuck bilaterally.  Complaining of severe right hip pain.  He was unable to bear weight so he called EMS.   Fall       Home Medications Prior to Admission medications   Not on File      Allergies    Ciprofloxacin, Codeine, and Lisinopril    Review of Systems   Review of Systems  Physical Exam Updated Vital Signs BP (!) 159/75   Pulse 74   Temp 97.9 F (36.6 C) (Oral)   Resp 18   Ht '5\' 8"'$  (1.727 m)   Wt 62 kg   SpO2 100%   BMI 20.78 kg/m  Physical Exam Vitals and nursing note reviewed.  Constitutional:      General: He is not in acute distress.    Appearance: Normal appearance. He is not ill-appearing.  HENT:     Head: Normocephalic and atraumatic.  Eyes:     General: No scleral icterus.    Conjunctiva/sclera: Conjunctivae normal.  Cardiovascular:     Pulses: Normal pulses.  Pulmonary:     Effort: Pulmonary effort is normal. No respiratory distress.  Musculoskeletal:        General: Tenderness and deformity present.     Comments: Unable to range the right lower extremity.  Full range of motion and no tenderness to the left extremities.  Very small deformity to the right proximal femur, just under his tenderness  Skin:    Findings: No rash.  Neurological:     Mental Status: He is alert.  Psychiatric:        Mood and Affect: Mood normal.     ED Results / Procedures / Treatments   Labs (all labs ordered are listed, but only abnormal results are displayed) Labs  Reviewed - No data to display  EKG None  Radiology DG Hip Unilat W or Wo Pelvis 2-3 Views Right  Result Date: 12/16/2022 CLINICAL DATA:  Fall this morning, right hip pain EXAM: DG HIP (WITH OR WITHOUT PELVIS) 2-3V RIGHT; RIGHT FEMUR 2 VIEWS COMPARISON:  None Available. FINDINGS: Mildly displaced, impacted transcervical fracture right femoral neck. The distal femur is intact. No displaced fracture or dislocation of the pelvis or proximal left femur seen in single frontal view. Post left hip total arthroplasty. No perihardware fracture or loosening. Vascular calcinosis. IMPRESSION: 1. Mildly displaced, impacted transcervical fracture right femoral neck. The distal right femur is intact. 2. No displaced fracture or dislocation of the pelvis or proximal left femur seen in single frontal view. 3. Status post left hip total arthroplasty. Electronically Signed   By: Delanna Ahmadi M.D.   On: 12/16/2022 14:33   DG Femur Min 2 Views Right  Result Date: 12/16/2022 CLINICAL DATA:  Fall this morning, right hip pain EXAM: DG HIP (WITH OR WITHOUT PELVIS) 2-3V RIGHT; RIGHT FEMUR 2 VIEWS COMPARISON:  None Available. FINDINGS: Mildly displaced, impacted transcervical fracture right femoral neck. The distal femur is intact. No displaced  fracture or dislocation of the pelvis or proximal left femur seen in single frontal view. Post left hip total arthroplasty. No perihardware fracture or loosening. Vascular calcinosis. IMPRESSION: 1. Mildly displaced, impacted transcervical fracture right femoral neck. The distal right femur is intact. 2. No displaced fracture or dislocation of the pelvis or proximal left femur seen in single frontal view. 3. Status post left hip total arthroplasty. Electronically Signed   By: Delanna Ahmadi M.D.   On: 12/16/2022 14:33    Procedures Procedures   Medications Ordered in ED Medications  fentaNYL (SUBLIMAZE) injection 50 mcg (has no administration in time range)    ED Course/ Medical  Decision Making/ A&P                           Medical Decision Making Amount and/or Complexity of Data Reviewed Labs: ordered. Radiology: ordered.  Risk Prescription drug management. Decision regarding hospitalization.   78 year old male presenting with right hip pain.  Differential includes but is not limited to fracture, dislocation, abscess, osteo-.   Past Medical History / Co-morbidities / Social History: Per external chart review patient has hypertension and CKD 3?  He says that he does not have any medical conditions or take any medications  No noted blood thinners and patient corroborates this.   Physical Exam: Pertinent physical exam findings include Tenderness and mild deformity to right proximal fever    Imaging Studies: I ordered and independently visualized and interpreted lower extremity x-rays and agree with radiology that there appears to be a mildly displaced fracture of the right femoral neck.     Medications: Given fentanyl  Consultations Obtained: Spoke with Dr. Marlou Sa with orthopedics who requests ED to ED transfer to [facility] for operation today.  Will require medical admission.  MDM/Disposition: This is a 77 year old male presenting today after a fall.  Has a right femoral neck fracture.  Needs to be transferred to [facility] for an admission.  Patient is agreeable.  I also spoke with patient's daughter who is agreeable to the plan.  Patient will need type n screen upon [facility]    Final Clinical Impression(s) / ED Diagnoses Final diagnoses:  Closed fracture of neck of right femur, initial encounter [facility])    Rx / DC Orders ED Discharge Orders     None      Update ED to ED physician, [name].  Admit to Dr. [name] hospitalist.     I discussed this case with my attending physician Dr. [name] who cosigned this note including patient's presenting symptoms, physical exam, and planned diagnostics and interventions. Attending  physician stated agreement with plan or made changes to plan which were implemented.      Darliss Ridgel 12/16/22 1550    Ezequiel Essex, MD 12/18/22 [phone]

## 2022-12-16 NOTE — Consult Note (Signed)
Reason for Consult:right hip pain Referring Physician: fang xu     Chad Winters is an 77 y.o. male.  HPI: Chad Winters is an active 77 year old patient with right hip pain.  Sustained a fall earlier today.  Denies any other orthopedic complaints.  Has a history of ankylosing spondylitis.  Was seen to have right hip femoral neck fracture.  Has had left total hip replacement done at [facility] prior to this fall.  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  Patient is ambulatory.  Past Medical History:  Diagnosis Date   Cancer [facility])     Past Surgical History:  Procedure Laterality Date   BRAIN SURGERY     TOTAL HIP ARTHROPLASTY Left     History reviewed. No pertinent family history.  Social History:  reports that he has been smoking cigars. He has never used smokeless tobacco. He reports current alcohol use. He reports that he does not use drugs.  Allergies:  Allergies  Allergen Reactions   Ciprofloxacin Itching, Rash and Hives   Codeine Nausea And Vomiting    Stiffness and nausea    Lisinopril     Other reaction(s): Cough (ALLERGY/intolerance)    Medications: I have reviewed the patient's current medications.  Results for orders placed or performed during the hospital encounter of 12/16/22 (from the past 48 hour(s))  CBC with Differential     Status: Abnormal   Collection Time: 12/16/22  3:05 PM  Result Value Ref Range   WBC 10.6 (H) 4.0 - 10.5 K/uL   RBC 3.83 (L) 4.22 - 5.81 MIL/uL   Hemoglobin 12.2 (L) 13.0 - 17.0 g/dL   HCT 36.1 (L) 39.0 - 52.0 %   MCV 94.3 80.0 - 100.0 fL   MCH 31.9 26.0 - 34.0 pg   MCHC 33.8 30.0 - 36.0 g/dL   RDW 14.6 11.5 - 15.5 %   Platelets 341 150 - 400 K/uL   nRBC 0.0 0.0 - 0.2 %   Neutrophils Relative % 83 %   Neutro Abs 8.7 (H) 1.7 - 7.7 K/uL   Lymphocytes Relative 8 %   Lymphs Abs 0.9 0.7 - 4.0 K/uL   Monocytes Relative 8 %   Monocytes Absolute 0.9 0.1 - 1.0 K/uL   Eosinophils Relative 0 %    Eosinophils Absolute 0.0 0.0 - 0.5 K/uL   Basophils Relative 0 %   Basophils Absolute 0.0 0.0 - 0.1 K/uL   Immature Granulocytes 1 %   Abs Immature Granulocytes 0.15 (H) 0.00 - 0.07 K/uL    Comment: Performed at [facility], [place] [address], [place], [place] [address]  Basic metabolic panel     Status: Abnormal   Collection Time: 12/16/22  3:05 PM  Result Value Ref Range   Sodium 136 135 - 145 mmol/L   Potassium 2.8 (L) 3.5 - 5.1 mmol/L   Chloride 103 98 - 111 mmol/L   CO2 23 22 - 32 mmol/L   Glucose, Bld 110 (H) 70 - 99 mg/dL    Comment: Glucose reference range applies only to samples taken after fasting for at least 8 hours.   BUN 19 8 - 23 mg/dL   Creatinine, Ser 1.17 0.61 - 1.24 mg/dL   Calcium 8.3 (L) 8.9 - 10.3 mg/dL   GFR, Estimated >60 >60 mL/min    Comment: (NOTE) Calculated using the CKD-EPI Creatinine Equation (2021)    Anion gap 10 5 - 15    Comment: Performed at [facility]  [place] [address], [place], [place] [address]  Magnesium     Status: None   Collection Time: 12/16/22  4:15 PM  Result Value Ref Range   Magnesium 1.7 1.7 - 2.4 mg/dL    Comment: Performed at [facility], [place] [address] [place], [place] [address]  ABO/Rh     Status: None (Preliminary result)   Collection Time: 12/16/22  5:05 PM  Result Value Ref Range   ABO/RH(D) PENDING   Type and screen [place]     Status: None (Preliminary result)   Collection Time: 12/16/22  5:10 PM  Result Value Ref Range   ABO/RH(D) PENDING    Antibody Screen PENDING    Sample Expiration      12/19/2022,2359 Performed at [facility], [place] [address], [place], [place] [address]     DG Chest Portable 1 View  Result Date: 12/16/2022 CLINICAL DATA:  Preop evaluation for hip surgery EXAM: PORTABLE CHEST 1 VIEW COMPARISON:  CT done on 09/21/2022 FINDINGS: Transverse diameter of heart is in the upper limits of normal. There are no signs  of pulmonary edema or focal pulmonary consolidation. There is no pleural effusion or pneumothorax. Deformities in the lateral aspect of right sixth and seventh ribs suggest old healed fractures. IMPRESSION: No active disease. Electronically Signed   By: Elmer Picker M.D.   On: 12/16/2022 15:31   DG Hip Unilat W or Wo Pelvis 2-3 Views Right  Result Date: 12/16/2022 CLINICAL DATA:  Fall this morning, right hip pain EXAM: DG HIP (WITH OR WITHOUT PELVIS) 2-3V RIGHT; RIGHT FEMUR 2 VIEWS COMPARISON:  None Available. FINDINGS: Mildly displaced, impacted transcervical fracture right femoral neck. The distal femur is intact. No displaced fracture or dislocation of the pelvis or proximal left femur seen in single frontal view. Post left hip total arthroplasty. No perihardware fracture or loosening. Vascular calcinosis. IMPRESSION: 1. Mildly displaced, impacted transcervical fracture right femoral neck. The distal right femur is intact. 2. No displaced fracture or dislocation of the pelvis or proximal left femur seen in single frontal view. 3. Status post left hip total arthroplasty. Electronically Signed   By: Delanna Ahmadi M.D.   On: 12/16/2022 14:33   DG Femur Min 2 Views Right  Result Date: 12/16/2022 CLINICAL DATA:  Fall this morning, right hip pain EXAM: DG HIP (WITH OR WITHOUT PELVIS) 2-3V RIGHT; RIGHT FEMUR 2 VIEWS COMPARISON:  None Available. FINDINGS: Mildly displaced, impacted transcervical fracture right femoral neck. The distal femur is intact. No displaced fracture or dislocation of the pelvis or proximal left femur seen in single frontal view. Post left hip total arthroplasty. No perihardware fracture or loosening. Vascular calcinosis. IMPRESSION: 1. Mildly displaced, impacted transcervical fracture right femoral neck. The distal right femur is intact. 2. No displaced fracture or dislocation of the pelvis or proximal left femur seen in single frontal view. 3. Status post left hip total arthroplasty.  Electronically Signed   By: Delanna Ahmadi M.D.   On: 12/16/2022 14:33    Review of Systems  Musculoskeletal:  Positive for arthralgias.  All other systems reviewed and are negative.  Blood pressure (!) 159/88, pulse 79, temperature 98.2 F (36.8 C), temperature source Oral, resp. rate 16, height '5\' 8"'$  (1.727 m), weight 62 kg, SpO2 99 %. Physical Exam Vitals reviewed.  HENT:     Head: Normocephalic.     Nose: Nose normal.     Mouth/Throat:     Mouth: Mucous membranes  are moist.  Eyes:     Pupils: Pupils are equal, round, and reactive to light.  Cardiovascular:     Rate and Rhythm: Normal rate.     Pulses: Normal pulses.  Pulmonary:     Effort: Pulmonary effort is normal.  Abdominal:     General: Abdomen is flat.  Musculoskeletal:     Cervical back: Normal range of motion.  Skin:    General: Skin is warm.     Capillary Refill: Capillary refill takes less than 2 seconds.  Neurological:     General: No focal deficit present.     Mental Status: He is alert.  Psychiatric:        Mood and Affect: Mood normal.   Examination demonstrates good range of motion of bilateral wrist elbows and shoulders.  Right leg has some shortening.  Pedal pulses intact.  Ankle dorsiflexion plantarflexion intact.  There is some bruising around the knee but there is no effusion on the right-hand side.  Left hip has good range of motion.  Skin intact in the right hip region.  Well-healed surgical incision from anterior approach hip replacement on the left. Assessment/Plan: Impression is right femoral neck fracture.  Plan is right total hip replacement.  The risk and benefits are discussed with the patient including not limited to infection nerve vessel damage instability leg length inequality as well as the perioperative risk of stroke heart attack and deep vein thrombosis.  No personal or family history of deep vein thrombosis or pulmonary embolism.  All questions answered.  Landry Dyke Spurgeon Gancarz 12/16/2022, 5:41 PM

## 2022-12-16 NOTE — Op Note (Signed)
NAME: Chad Winters, SOOKDEO MEDICAL RECORD NO: [id number] ACCOUNT NO: [id number] DATE OF BIRTH: 08-23-1946 FACILITY: [facility] LOCATION: [facility] PHYSICIAN: Yetta Barre. Marlou Sa, MD  Operative Report   DATE OF PROCEDURE: 12/16/2022  PREOPERATIVE DIAGNOSIS:  Right femoral neck fracture, displaced.  POSTOPERATIVE DIAGNOSIS:  Right femoral neck fracture, displaced.  PROCEDURE:  Right total hip replacement using DePuy components ACTIS stem size 6 with +5 36 mm head and 52 mm Pinnacle press-fit cup with +4 liner offset and two acetabular screws.  SURGEON:  Yetta Barre. Marlou Sa, MD  ASSISTANT:  Annie Main, PA  INDICATIONS:  The patient is a 77 year old patient with right femoral neck fracture who presents for operative management after explanation of risks and benefits.  He has had left total hip replacement performed.  PROCEDURE IN DETAIL:  The patient was brought to the operating room where general anesthetic was induced.  Preoperative antibiotics administered.  Timeout was called.  The patient was placed on the Hana bed.  Right hip prescrubbed with alcohol and  Betadine, allowed to air dry, prepped with DuraPrep solution and draped in sterile manner.  Ioban used to cover the operative field with the wall drape.  After calling a timeout, an incision was made about 2 cm distal and posterior to the anterior  superior iliac crest.  Skin and subcutaneous tissue were sharply divided for about 10 cm. Fascia lata was encountered and divided in its mid section.  A plane was then developed between the tensor fascia lata and the rectus.  Circumflex vessels were  ligated.  Blunt dissection performed to place blunt retractors on the inferior and superior aspect of the femoral neck.  At this time, the capsule was incised in T-shaped fashion and marked with #1 Ethibond suture.  Dissection was performed around to the  inferior femoral neck.  With some traction in place, the head was removed.  Next, the femoral neck was cut.   Correct amount of cut was confirmed in the AP planes under fluoroscopy.  Next, some external rotation was maintained.  Retractors placed around  the acetabulum.  Pulvinar released.  Reaming then performed in approximately 45 degrees of abduction and about 10-15 degrees of anteversion.  Reaming performed up to 52 mm.  Very nice fit was obtained with the 52 Pinnacle cup. Two screws were then placed.  +4  liner placed. Next, the femoral lift was placed.  At that time external rotation and adduction and extension was performed.  The Mueller retractor was placed and the trochanteric retractor was placed.  Conjoined tendon was released.  Lateralization was  performed with the box cutting osteotome.  Next, broaching performed up to a size 6.  With the 6 broach in place and with adequate lateralization in place, the hip was reduced with the +5 and 0 neck length of 36 ball.  The +5 gave excellent stability as  well as excellent leg lengths.  Trial components were removed.  Thorough irrigation performed. Vancomycin and IrriSept solution utilized after the incision as well as after the arthrotomy and at all times during the case.  The stem was placed and then 2  more trial reductions were performed with the 0 ball and the +5 ball.  The +5 ball gave excellent stability with 45 degrees of external rotation and about 75 degrees of extension.  Good leg lengths confirmed in the AP planes under fluoroscopy.  Next, the  true components were placed, the same stability parameters maintained.  Thorough irrigation again performed. Vancomycin powder after IrriSept solution  was placed into the joint and on the prosthesis.  Next, the capsule was closed using #1 Vicryl suture  followed by interrupted inverted 0 Vicryl suture to close out some of the dead space followed by #1 Vicryl suture to close the fascia lata. Then 0 Vicryl suture, 2-0 Vicryl suture, and 3-0 Monocryl with Steri-Strips and Aquacel dressing applied.  Leg  lengths  equal under visual inspection.  The patient tolerated the procedure well without immediate complication.  Luke's assistance was required for opening, closing, mobilization of tissue, reduction, reaming, drilling.  His assistance was a medical  necessity.   VAI D: 12/16/2022 10:11:28 pm T: 12/16/2022 11:18:00 pm  JOB: [id number]/ [id number]

## 2022-12-16 NOTE — Transfer of Care (Signed)
Immediate Anesthesia Transfer of Care Note  Patient: Chad Winters  Procedure(s) Performed: RIGHT TOTAL HIP ARTHROPLASTY ANTERIOR APPROACH (Right: Hip)  Patient Location: PACU  Anesthesia Type:General  Level of Consciousness: awake and alert   Airway & Oxygen Therapy: Patient Spontanous Breathing  Post-op Assessment: Report given to RN and Post -op Vital signs reviewed and stable  Post vital signs: Reviewed and stable  Last Vitals:  Vitals Value Taken Time  BP 143/67 12/16/22 2211  Temp    Pulse 73 12/16/22 2215  Resp 13 12/16/22 2215  SpO2 99 % 12/16/22 2215  Vitals shown include unvalidated device data.  Last Pain:  Vitals:   12/16/22 1801  TempSrc:   PainSc: 6          Complications: No notable events documented.

## 2022-12-16 NOTE — Anesthesia Preprocedure Evaluation (Addendum)
Anesthesia Evaluation  Patient identified by MRN, date of birth, ID band Patient awake  General Assessment Comment: 77 y.o. 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    Pulmonary Current Smoker   Pulmonary exam normal breath sounds clear to auscultation       Cardiovascular negative cardio ROS Normal cardiovascular exam Rhythm:Regular Rate:Normal     Neuro/Psych negative neurological ROS  negative psych ROS   GI/Hepatic negative GI ROS, Neg liver ROS,,,  Endo/Other  negative endocrine ROS    Renal/GU H/O renal cell ca  negative genitourinary   Musculoskeletal  (+) Arthritis ,  Right hip fx  Ankylosing spondylitis   Abdominal   Peds negative pediatric ROS (+)  Hematology negative hematology ROS (+)   Anesthesia Other Findings Hypokalemia 2.8 Will receive 3 runs of 10mq KCL IV  Reproductive/Obstetrics negative OB ROS                             Anesthesia Physical Anesthesia Plan  ASA: 3  Anesthesia Plan: Spinal and MAC   Post-op Pain Management:    Induction: Intravenous  PONV Risk Score and Plan: 1 and Ondansetron, Dexamethasone, Propofol infusion and Treatment may vary due to age or medical condition  Airway Management Planned: Natural Airway, Simple Face Mask and Nasal Cannula  Additional Equipment: None  Intra-op Plan:   Post-operative Plan: Extubation in OR  Informed Consent: I have reviewed the  patients History and Physical, chart, labs and discussed the procedure including the risks, benefits and alternatives for the proposed anesthesia with the patient or authorized representative who has indicated his/her understanding and acceptance.     Dental advisory given  Plan Discussed with: CRNA and Surgeon  Anesthesia Plan Comments: (Lab Results      Component                Value               Date                      WBC                      10.6 (H)            12/16/2022                HGB                      12.2 (L)            12/16/2022                HCT                      36.1 (L)            12/16/2022                MCV  94.3                12/16/2022                PLT                      341                 12/16/2022              Lab Results      Component                Value               Date                      NA                       136                 12/16/2022                K                        2.8 (L)             12/16/2022                CO2                      23                  12/16/2022                GLUCOSE                  110 (H)             12/16/2022                BUN                      19                  12/16/2022                CREATININE               1.17                12/16/2022                CALCIUM                  8.3 (L)             12/16/2022                GFRNONAA                 >60                 12/16/2022           )       Anesthesia Quick Evaluation

## 2022-12-16 NOTE — ED Provider Triage Note (Signed)
Emergency Medicine Provider Triage Evaluation Note  Chad Winters , a 77 y.o. male  was evaluated in triage.  Patient is getting up earlier this morning and having a mechanical fall.  Fell onto his right hip and was able to drive himself to a chair.  He was hoping to feel better but he never felt better and could not bear any weight so he called 911.  No other pain.  Did not hit his head, denies any blood thinners  Review of Systems  Positive:  Negative:   Physical Exam  BP (!) 159/75   Pulse 74   Temp 97.9 F (36.6 C) (Oral)   Resp 18   Ht '5\' 8"'$  (1.727 m)   Wt 62 kg   SpO2 100%   BMI 20.78 kg/m  Gen:   Awake, no distress   Resp:  Normal effort  MSK:   Moves extremities without difficulty  Other:  Tenderness over proximal femur  Medical Decision Making  Medically screening exam initiated at 12:37 PM.  Appropriate orders placed.  Jonne Ply was informed that the remainder of the evaluation will be completed by another provider, this initial triage assessment does not replace that evaluation, and the importance of remaining in the ED until their evaluation is complete.     Rhae Hammock, [place] 12/16/22 1237

## 2022-12-16 NOTE — ED Notes (Signed)
Maggie, RN called for report at [facility] ED.

## 2022-12-16 NOTE — Brief Op Note (Signed)
   12/16/2022  10:01 PM  PATIENT:  Chad Winters  77 y.o. male  PRE-OPERATIVE DIAGNOSIS:  Right Hip Femoral Neck Fracture  POST-OPERATIVE DIAGNOSIS:  Right Hip Femoral Neck Fracture  PROCEDURE:  Procedure(s): RIGHT TOTAL HIP ARTHROPLASTY ANTERIOR APPROACH  SURGEON:  Surgeon(s): Meredith Pel, MD  ASSISTANT: magnant pa  ANESTHESIA:   general  EBL: 150 ml    Total I/O In: 700 [I.V.:500; IV Piggyback:200] Out: 150 [Blood:150]  BLOOD ADMINISTERED: none  DRAINS: none   LOCAL MEDICATIONS USED:  vanco Marcaine morphine [id number]  SPECIMEN:  No Specimen  COUNTS:  YES  TOURNIQUET:  * No tourniquets in log *  DICTATION: .Other Dictation: Dictation Number done  PLAN OF CARE: Admit to inpatient   PATIENT DISPOSITION:  PACU - hemodynamically stable

## 2022-12-16 NOTE — ED Triage Notes (Signed)
[name] EMS from home after fall this morning. Pt states he felt weak and fell while trying to move over to chair. Denies LOC, or blood thinners.

## 2022-12-16 NOTE — H&P (Addendum)
History and Physical    Chad Winters AST:[id number] DOB: 08-Jan-1946 DOA: 12/16/2022  PCP: Pcp, No Patient coming from: home  I have personally briefly reviewed patient's old medical records in [place]  Chief Complaint: accidental fall with right hip pain  HPI: Chad Winters is a 77 y.o. 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 rate 18, height '5\' 8"'$  (1.727 m), weight 62 kg, SpO2 100 %.  Hip X ray showed "1. Mildly displaced, impacted transcervical fracture right femoral neck. The distal right femur is intact. 2. No displaced fracture or dislocation of the pelvis or proximal left femur seen in single frontal view. 3. Status post left hip total arthroplasty.  CXR no acute findings  WBC 10.6, hemoglobin 12.2, platelet 341 Potassium 2.8, creatinine 1.17   EDP discussed with Ortho Dr. Marlou Sa who recommended [facility] ED to [facility] ED transfer for surgery today, then stay at [facility] Systems: As per HPI otherwise all other systems reviewed and are negative.   Past Medical History:  Diagnosis Date   Cancer [facility])     Past Surgical History:  Procedure Laterality Date   BRAIN SURGERY     TOTAL HIP ARTHROPLASTY Left     Social History  reports that he has been smoking cigars. He has never used smokeless tobacco. He reports current alcohol use. He reports that he does not use  drugs.  Allergies  Allergen Reactions   Ciprofloxacin Itching, Rash and Hives   Codeine Nausea And Vomiting    Stiffness and nausea    Lisinopril     Other reaction(s): Cough (ALLERGY/intolerance)    History reviewed. No pertinent family history.  Prior to Admission medications   Not on File    Physical Exam: Vitals:   12/16/22 1219 12/16/22 1221  BP:  (!) 159/75  Pulse:  74  Resp:  18  Temp:  97.9 F (36.6 C)  TempSrc:  Oral  SpO2:  100%  Weight: 62 kg   Height: '5\' 8"'$  (1.727 m)     Constitutional: NAD, calm, comfortable, hard of hearing  Eyes: PERRL, lids and conjunctivae normal ENMT: Mucous membranes are moist.  Respiratory: clear to auscultation bilaterally, no wheezing, no crackles. Normal respiratory effort. No accessory muscle use.  Cardiovascular: Regular rate and rhythm,  No extremity edema. 2+ pedal pulses. No carotid bruits.  Abdomen: no tenderness, not distended, Bowel sounds positive.  Musculoskeletal: right leg limited range of motion due to pain Skin: no rashes, lesions, ulcers. No induration Neurologic: CN 2-12 grossly intact. Sensation intact, aaox3 Psychiatric: Normal judgment and insight. Normal mood.    Labs on Admission: I have personally reviewed following labs and imaging studies  CBC: Recent Labs  Lab 12/16/22 1505  WBC 10.6*  NEUTROABS 8.7*  HGB 12.2*  HCT 36.1*  MCV 94.3  PLT 989    Basic Metabolic Panel: Recent Labs  Lab 12/16/22 1505 12/16/22 1615  NA 136  --   K 2.8*  --  CL 103  --   CO2 23  --   GLUCOSE 110*  --   BUN 19  --   CREATININE 1.17  --   CALCIUM 8.3*  --   MG  --  1.7    GFR: Estimated Creatinine Clearance: 47.8 mL/min (by C-G formula based on SCr of 1.17 mg/dL).  Liver Function Tests: No results for input(s): "AST", "ALT", "ALKPHOS", "BILITOT", "PROT", "ALBUMIN" in the last 168 hours.  Urine analysis: No results found for: "COLORURINE", "APPEARANCEUR", "LABSPEC", "PHURINE", "GLUCOSEU", "HGBUR",  "BILIRUBINUR", "KETONESUR", "PROTEINUR", "UROBILINOGEN", "NITRITE", "LEUKOCYTESUR"  Radiological Exams on Admission: DG Chest Portable 1 View  Result Date: 12/16/2022 CLINICAL DATA:  Preop evaluation for hip surgery EXAM: PORTABLE CHEST 1 VIEW COMPARISON:  CT done on 09/21/2022 FINDINGS: Transverse diameter of heart is in the upper limits of normal. There are no signs of pulmonary edema or focal pulmonary consolidation. There is no pleural effusion or pneumothorax. Deformities in the lateral aspect of right sixth and seventh ribs suggest old healed fractures. IMPRESSION: No active disease. Electronically Signed   By: Elmer Picker M.D.   On: 12/16/2022 15:31   DG Hip Unilat W or Wo Pelvis 2-3 Views Right  Result Date: 12/16/2022 CLINICAL DATA:  Fall this morning, right hip pain EXAM: DG HIP (WITH OR WITHOUT PELVIS) 2-3V RIGHT; RIGHT FEMUR 2 VIEWS COMPARISON:  None Available. FINDINGS: Mildly displaced, impacted transcervical fracture right femoral neck. The distal femur is intact. No displaced fracture or dislocation of the pelvis or proximal left femur seen in single frontal view. Post left hip total arthroplasty. No perihardware fracture or loosening. Vascular calcinosis. IMPRESSION: 1. Mildly displaced, impacted transcervical fracture right femoral neck. The distal right femur is intact. 2. No displaced fracture or dislocation of the pelvis or proximal left femur seen in single frontal view. 3. Status post left hip total arthroplasty. Electronically Signed   By: Delanna Ahmadi M.D.   On: 12/16/2022 14:33   DG Femur Min 2 Views Right  Result Date: 12/16/2022 CLINICAL DATA:  Fall this morning, right hip pain EXAM: DG HIP (WITH OR WITHOUT PELVIS) 2-3V RIGHT; RIGHT FEMUR 2 VIEWS COMPARISON:  None Available. FINDINGS: Mildly displaced, impacted transcervical fracture right femoral neck. The distal femur is intact. No displaced fracture or dislocation of the pelvis or proximal left femur seen in single  frontal view. Post left hip total arthroplasty. No perihardware fracture or loosening. Vascular calcinosis. IMPRESSION: 1. Mildly displaced, impacted transcervical fracture right femoral neck. The distal right femur is intact. 2. No displaced fracture or dislocation of the pelvis or proximal left femur seen in single frontal view. 3. Status post left hip total arthroplasty. Electronically Signed   By: Delanna Ahmadi M.D.   On: 12/16/2022 14:33    Assessment/Plan Active Problems:   Closed displaced fracture of right femoral neck (HCC)   Hypokalemia   Ankylosing spondylitis (HCC)   Immunosuppressed status (HCC)    Right femoral neck fracture -N.p.o. currently, plan for OR today -Management per Dr. Marlou Sa  Hypokalemia Replace K, mag in process  CKD2 Cr at baseline   Ankylosing spondylitis, ambulatory dysfunction  chronic immunosuppressed on Humira Stable at baseline, follows rheumatology  HTN, prn hydralazine for now Gout, no acute issues  Awaiting for med rec to complete , please resume home meds when meds rec is done    DVT prophylaxis: per ortho   Code Status:   Full  Family Communication:  Declined my offer to call his family, states he updated  his family already  Patient is from: home    Anticipated DC to: TBD   Anticipated DC date: >24hrs    Consults called:  Ortho Dr Marlou Sa Admission status:  inpatient  Severity of Illness:   The appropriate patient status for this patient is INPATIENT due to history and comorbidities, severity of illness, required intensity of service to ensure the patient's safety and to avoid risk of adverse events/further clinical deterioration.  Severity of illness/comorbidities: hip fracture, need surgery, hypokalemia Intensity of service: tests, high frequency of surveillance, interventions It is not anticipated that the patient will be medically stable for discharge from the hospital within 2 midnights of admission.    Voice Recognition  Viviann Spare dictation system was used to create this note, attempts have been made to correct errors. Please contact the author with questions and/or clarifications.  Florencia Reasons MD PhD FACP [facility]  How to contact the [facility] Attending or Consulting provider [place] or covering provider during after hours [place], for this patient?   Check the care team in [facility] and look for a) attending/consulting [facility] provider listed and b) the [facility] team listed Log into [email or web address] and use [place]'s universal password to access. If you do not have the password, please contact the hospital operator. Locate the [facility] provider you are looking for under [facility] and page to a number that you can be directly reached. If you still have difficulty reaching the provider, please page the [facility] (Director on Call) for the Hospitalists listed on amion for assistance.  12/16/2022, 5:21 PM

## 2022-12-16 NOTE — ED Notes (Signed)
[facility] called. 

## 2022-12-17 DIAGNOSIS — I10 Essential (primary) hypertension: Secondary | ICD-10-CM | POA: Insufficient documentation

## 2022-12-17 DIAGNOSIS — S72001A Fracture of unspecified part of neck of right femur, initial encounter for closed fracture: Secondary | ICD-10-CM | POA: Diagnosis not present

## 2022-12-17 DIAGNOSIS — M459 Ankylosing spondylitis of unspecified sites in spine: Secondary | ICD-10-CM

## 2022-12-17 DIAGNOSIS — Z8669 Personal history of other diseases of the nervous system and sense organs: Secondary | ICD-10-CM

## 2022-12-17 DIAGNOSIS — E785 Hyperlipidemia, unspecified: Secondary | ICD-10-CM | POA: Insufficient documentation

## 2022-12-17 LAB — MAGNESIUM: Magnesium: 1.5 mg/dL — ABNORMAL LOW (ref 1.7–2.4)

## 2022-12-17 LAB — CBC WITH DIFFERENTIAL/PLATELET
Abs Immature Granulocytes: 0.11 10*3/uL — ABNORMAL HIGH (ref 0.00–0.07)
Basophils Absolute: 0 10*3/uL (ref 0.0–0.1)
Basophils Relative: 0 %
Eosinophils Absolute: 0 10*3/uL (ref 0.0–0.5)
Eosinophils Relative: 0 %
HCT: 30.4 % — ABNORMAL LOW (ref 39.0–52.0)
Hemoglobin: 10.1 g/dL — ABNORMAL LOW (ref 13.0–17.0)
Immature Granulocytes: 1 %
Lymphocytes Relative: 5 %
Lymphs Abs: 0.6 10*3/uL — ABNORMAL LOW (ref 0.7–4.0)
MCH: 31.9 pg (ref 26.0–34.0)
MCHC: 33.2 g/dL (ref 30.0–36.0)
MCV: 95.9 fL (ref 80.0–100.0)
Monocytes Absolute: 0.5 10*3/uL (ref 0.1–1.0)
Monocytes Relative: 5 %
Neutro Abs: 10 10*3/uL — ABNORMAL HIGH (ref 1.7–7.7)
Neutrophils Relative %: 89 %
Platelets: 292 10*3/uL (ref 150–400)
RBC: 3.17 MIL/uL — ABNORMAL LOW (ref 4.22–5.81)
RDW: 14.6 % (ref 11.5–15.5)
WBC: 11.2 10*3/uL — ABNORMAL HIGH (ref 4.0–10.5)
nRBC: 0 % (ref 0.0–0.2)

## 2022-12-17 LAB — BASIC METABOLIC PANEL
Anion gap: 6 (ref 5–15)
BUN: 16 mg/dL (ref 8–23)
CO2: 21 mmol/L — ABNORMAL LOW (ref 22–32)
Calcium: 7.3 mg/dL — ABNORMAL LOW (ref 8.9–10.3)
Chloride: 103 mmol/L (ref 98–111)
Creatinine, Ser: 1.17 mg/dL (ref 0.61–1.24)
GFR, Estimated: >60 mL/min (ref >60)
Glucose, Bld: 168 mg/dL — ABNORMAL HIGH (ref 70–99)
Potassium: 3.5 mmol/L (ref 3.5–5.1)
Sodium: 130 mmol/L — ABNORMAL LOW (ref 135–145)

## 2022-12-17 MED ORDER — LABETALOL HCL 5 MG/ML IV SOLN: 10.0000 mg | INTRAVENOUS | Status: DC | PRN

## 2022-12-17 MED ORDER — VITAMIN B-12 1000 MCG PO TABS
1000.0000 ug | ORAL_TABLET | Freq: Every day | ORAL | Status: DC
  Administered 2022-12-17 – 2022-12-20 (×4): 1000 ug via ORAL
  Filled 2022-12-17 (×4): qty 1

## 2022-12-17 MED ORDER — DULOXETINE HCL 20 MG PO CPEP
40.0000 mg | ORAL_CAPSULE | Freq: Every day | ORAL | Status: DC
  Administered 2022-12-17 – 2022-12-20 (×4): 40 mg via ORAL
  Filled 2022-12-17 (×4): qty 2

## 2022-12-17 MED ORDER — FINASTERIDE 5 MG PO TABS
5.0000 mg | ORAL_TABLET | Freq: Every day | ORAL | Status: DC
  Administered 2022-12-17 – 2022-12-20 (×4): 5 mg via ORAL
  Filled 2022-12-17 (×4): qty 1

## 2022-12-17 MED ORDER — AMLODIPINE BESYLATE 5 MG PO TABS
5.0000 mg | ORAL_TABLET | Freq: Every day | ORAL | Status: DC
  Administered 2022-12-17 – 2022-12-20 (×4): 5 mg via ORAL
  Filled 2022-12-17 (×4): qty 1

## 2022-12-17 MED ORDER — ATORVASTATIN CALCIUM 40 MG PO TABS
40.0000 mg | ORAL_TABLET | Freq: Every day | ORAL | Status: DC
  Administered 2022-12-17 – 2022-12-20 (×4): 40 mg via ORAL
  Filled 2022-12-17 (×4): qty 1

## 2022-12-17 MED ORDER — ALLOPURINOL 100 MG PO TABS
100.0000 mg | ORAL_TABLET | Freq: Every day | ORAL | Status: DC
  Administered 2022-12-17 – 2022-12-20 (×4): 100 mg via ORAL
  Filled 2022-12-17 (×4): qty 1

## 2022-12-17 MED ORDER — TOPIRAMATE 25 MG PO TABS
50.0000 mg | ORAL_TABLET | Freq: Two times a day (BID) | ORAL | Status: DC
  Administered 2022-12-17 – 2022-12-20 (×7): 50 mg via ORAL
  Filled 2022-12-17 (×7): qty 2

## 2022-12-17 MED ORDER — HYDRALAZINE HCL 25 MG PO TABS: 25.0000 mg | ORAL_TABLET | ORAL | Status: DC | PRN

## 2022-12-17 NOTE — Assessment & Plan Note (Signed)
-   Follows with rheumatology outpatient - Continue Humira

## 2022-12-17 NOTE — Assessment & Plan Note (Signed)
-   Continue amlodipine ?

## 2022-12-17 NOTE — Anesthesia Postprocedure Evaluation (Signed)
Anesthesia Post Note  Patient: Chad Winters  Procedure(s) Performed: RIGHT TOTAL HIP ARTHROPLASTY ANTERIOR APPROACH (Right: Hip)     Patient location during evaluation: PACU Anesthesia Type: General Level of consciousness: awake and alert Pain management: pain level controlled Vital Signs Assessment: post-procedure vital signs reviewed and stable Respiratory status: spontaneous breathing, nonlabored ventilation, respiratory function stable and patient connected to nasal cannula oxygen Cardiovascular status: blood pressure returned to baseline and stable Postop Assessment: no apparent nausea or vomiting Anesthetic complications: no   No notable events documented.  Last Vitals:  Vitals:   12/16/22 2309 12/17/22 0434  BP: (!) 153/81 125/75  Pulse: 61 67  Resp: 16 18  Temp: 36.8 C   SpO2: 98% 100%    Last Pain:  Vitals:   12/17/22 0434  TempSrc:   PainSc: [place]

## 2022-12-17 NOTE — Progress Notes (Signed)
Patients valuables (cards, metal clip, $29 cash, car key and business card) were locked in security office. Pls retrieve on pt's discharge day.

## 2022-12-17 NOTE — Progress Notes (Addendum)
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  He presented after a mechanical fall at home. Imaging revealed a mildly displaced, impacted transcervical fracture right femoral neck.  Orthopedic surgery was consulted and patient was admitted for surgical repair.  He underwent right total hip replacement on 12/16/2022.  Interval History:  Seen this morning in his room resting in bed.  Tolerated surgery well last night.  He is anxious for working with PT today as he wishes to ultimately go home at time of discharge and does not want to go to rehab if he can prevent it.  Assessment and Plan: Closed displaced fracture of right femoral neck (HCC) - s/p mechanical fall at home - s/p right total hip replacement on 12/16/2022 -Follow-up PT eval - Continue aspirin - Patient preferring for going home at discharge in instead of rehab; will have to see how he does with PT  HLD (hyperlipidemia) - Continue Lipitor  HTN (hypertension) - Continue amlodipine  History of migraine headaches - Follows with neurology - Continue Topamax  Ankylosing spondylitis ([place]) - Follows with rheumatology outpatient - Continue Humira  Hypokalemia - Replete as needed    Old records reviewed in assessment of this patient  Antimicrobials:   DVT prophylaxis:  SCDs Start: 12/16/22 2312 SCDs Start: 12/16/22 2311   Code Status:   Code Status: Full Code  Mobility Assessment (last 72 hours)     Mobility Assessment      Row Name 12/17/22 0758 12/16/22 2323         Does patient have an order for bedrest or is patient medically unstable No - Continue assessment No - Continue assessment      What is the highest level of mobility based on the progressive mobility assessment? Level 2 ([place]) - Balance while sitting on edge of bed and cannot stand Level 2 (Chairfast) - Balance while sitting on edge of bed and cannot stand      Is the above level different from baseline mobility prior to current illness? Yes - Recommend PT order Yes - Recommend PT order               Barriers to discharge:  Disposition Plan: Pending PT eval Status is: Inpatient  Objective: Blood pressure 126/77, pulse 68, temperature 98 F (36.7 C), temperature source Oral, resp. rate 18, height '5\' 8"'$  (1.727 m), weight 62 kg, SpO2 100 %.  Examination:  Physical Exam Constitutional:      General: He is not in acute distress.    Appearance: Normal appearance.     Comments: Hard of hearing in general  HENT:     Head: Normocephalic and atraumatic.     Mouth/Throat:     Mouth: Mucous membranes are moist.  Eyes:     Extraocular Movements: Extraocular movements intact.  Cardiovascular:     Rate and Rhythm: Normal rate and regular rhythm.     Heart sounds: Normal heart sounds.  Pulmonary:     Effort: Pulmonary effort is normal. No respiratory distress.  Breath sounds: Normal breath sounds. No wheezing.  Abdominal:     General: Bowel sounds are normal. There is no distension.     Palpations: Abdomen is soft.     Tenderness: There is no abdominal tenderness.  Musculoskeletal:        General: Normal range of motion.     Cervical back: Normal range of motion and neck supple.     Comments: Surgical dressing in place on right lateral thigh, compartments soft  Skin:    General: Skin is warm and dry.  Neurological:     General: No focal deficit present.     Mental Status: He is alert.  Psychiatric:        Mood and Affect: Mood  normal.        Behavior: Behavior normal.      Consultants:  Orthopedic surgery  Procedures:  12/16/2022: Right total hip replacement  Data Reviewed: Results for orders placed or performed during the hospital encounter of 12/16/22 (from the past 24 hour(s))  CBC with Differential     Status: Abnormal   Collection Time: 12/16/22  3:05 PM  Result Value Ref Range   WBC 10.6 (H) 4.0 - 10.5 K/uL   RBC 3.83 (L) 4.22 - 5.81 MIL/uL   Hemoglobin 12.2 (L) 13.0 - 17.0 g/dL   HCT 36.1 (L) 39.0 - 52.0 %   MCV 94.3 80.0 - 100.0 fL   MCH 31.9 26.0 - 34.0 pg   MCHC 33.8 30.0 - 36.0 g/dL   RDW 14.6 11.5 - 15.5 %   Platelets 341 150 - 400 K/uL   nRBC 0.0 0.0 - 0.2 %   Neutrophils Relative % 83 %   Neutro Abs 8.7 (H) 1.7 - 7.7 K/uL   Lymphocytes Relative 8 %   Lymphs Abs 0.9 0.7 - 4.0 K/uL   Monocytes Relative 8 %   Monocytes Absolute 0.9 0.1 - 1.0 K/uL   Eosinophils Relative 0 %   Eosinophils Absolute 0.0 0.0 - 0.5 K/uL   Basophils Relative 0 %   Basophils Absolute 0.0 0.0 - 0.1 K/uL   Immature Granulocytes 1 %   Abs Immature Granulocytes 0.15 (H) 0.00 - 0.07 K/uL  Basic metabolic panel     Status: Abnormal   Collection Time: 12/16/22  3:05 PM  Result Value Ref Range   Sodium 136 135 - 145 mmol/L   Potassium 2.8 (L) 3.5 - 5.1 mmol/L   Chloride 103 98 - 111 mmol/L   CO2 23 22 - 32 mmol/L   Glucose, Bld 110 (H) 70 - 99 mg/dL   BUN 19 8 - 23 mg/dL   Creatinine, Ser 1.17 0.61 - 1.24 mg/dL   Calcium 8.3 (L) 8.9 - 10.3 mg/dL   GFR, Estimated >60 >60 mL/min   Anion gap 10 5 - 15  Magnesium     Status: None   Collection Time: 12/16/22  4:15 PM  Result Value Ref Range   Magnesium 1.7 1.7 - 2.4 mg/dL  ABO/Rh     Status: None   Collection Time: 12/16/22  5:05 PM  Result Value Ref Range   ABO/RH(D)      A POS Performed at [facility], [address] [place],  [address]   Type and screen [place]     Status: None   Collection Time: 12/16/22  5:10 PM   Result Value Ref Range   ABO/RH(D) A POS    Antibody Screen NEG    Sample  Expiration      12/19/2022,2359 Performed at [facility] [address], [place], [place] [address]   Surgical pcr screen     Status: None   Collection Time: 12/16/22  5:35 PM   Specimen: Nasal Mucosa; Nasal Swab  Result Value Ref Range   MRSA, PCR NEGATIVE NEGATIVE   Staphylococcus aureus NEGATIVE NEGATIVE  I-STAT, chem 8     Status: Abnormal   Collection Time: 12/16/22  7:14 PM  Result Value Ref Range   Sodium 139 135 - 145 mmol/L   Potassium 3.3 (L) 3.5 - 5.1 mmol/L   Chloride 103 98 - 111 mmol/L   BUN 16 8 - 23 mg/dL   Creatinine, Ser 1.00 0.61 - 1.24 mg/dL   Glucose, Bld 98 70 - 99 mg/dL   Calcium, Ion 1.17 1.15 - 1.40 mmol/L   TCO2 21 (L) 22 - 32 mmol/L   Hemoglobin 12.6 (L) 13.0 - 17.0 g/dL   HCT 37.0 (L) 39.0 - 29.5 %  Basic metabolic panel     Status: Abnormal   Collection Time: 12/17/22  3:47 AM  Result Value Ref Range   Sodium 130 (L) 135 - 145 mmol/L   Potassium 3.5 3.5 - 5.1 mmol/L   Chloride 103 98 - 111 mmol/L   CO2 21 (L) 22 - 32 mmol/L   Glucose, Bld 168 (H) 70 - 99 mg/dL   BUN 16 8 - 23 mg/dL   Creatinine, Ser 1.17 0.61 - 1.24 mg/dL   Calcium 7.3 (L) 8.9 - 10.3 mg/dL   GFR, Estimated >60 >60 mL/min   Anion gap 6 5 - 15  CBC with Differential/Platelet     Status: Abnormal   Collection Time: 12/17/22  3:47 AM  Result Value Ref Range   WBC 11.2 (H) 4.0 - 10.5 K/uL   RBC 3.17 (L) 4.22 - 5.81 MIL/uL   Hemoglobin 10.1 (L) 13.0 - 17.0 g/dL   HCT 30.4 (L) 39.0 - 52.0 %   MCV 95.9 80.0 - 100.0 fL   MCH 31.9 26.0 - 34.0 pg   MCHC 33.2 30.0 - 36.0 g/dL   RDW 14.6 11.5 - 15.5 %   Platelets 292 150 - 400 K/uL   nRBC 0.0 0.0 - 0.2 %   Neutrophils Relative % 89 %   Neutro Abs 10.0 (H) 1.7 - 7.7 K/uL   Lymphocytes Relative 5 %   Lymphs Abs 0.6 (L) 0.7 - 4.0 K/uL   Monocytes Relative 5 %   Monocytes Absolute 0.5 0.1 - 1.0 K/uL   Eosinophils Relative 0 %   Eosinophils  Absolute 0.0 0.0 - 0.5 K/uL   Basophils Relative 0 %   Basophils Absolute 0.0 0.0 - 0.1 K/uL   Immature Granulocytes 1 %   Abs Immature Granulocytes 0.11 (H) 0.00 - 0.07 K/uL  Magnesium     Status: Abnormal   Collection Time: 12/17/22  3:47 AM  Result Value Ref Range   Magnesium 1.5 (L) 1.7 - 2.4 mg/dL    I have reviewed pertinent nursing notes, vitals, labs, and images as necessary. I have ordered labwork to follow up on as indicated.  I have reviewed the last notes from staff over past 24 hours. I have discussed patient's care plan and test results with nursing staff, CM/SW, and other staff as appropriate.  Time spent: Greater than 50% of the 55 minute visit was spent in counseling/coordination of care for the patient as laid out in the A&P.   LOS: 1  day   Dwyane Dee, MD [facility] 12/17/2022, 1:47 PM

## 2022-12-17 NOTE — TOC CAGE-AID Note (Signed)
Transition of Care [place]) - CAGE-AID Screening   Patient Details  Name: Haskel Dewalt MRN: [id number] Date of Birth: 1946-02-25  Transition of Care [facility]) CM/SW Contact:    Army Melia, RN Phone Number:[phone] 12/17/2022, 10:55 PM   CAGE-AID Screening:    Have You Ever Felt You Ought to Cut Down on Your Drinking or Drug Use?: No Have People Annoyed You By Critizing Your Drinking Or Drug Use?: No Have You Felt Bad Or Guilty About Your Drinking Or Drug Use?: No Have You Ever Had a Drink or Used Drugs First Thing In The Morning to Steady Your Nerves or to Get Rid of a Hangover?: No CAGE-AID Score: 0  Substance Abuse Education Offered: No

## 2022-12-17 NOTE — Assessment & Plan Note (Signed)
-  Continue Lipitor °

## 2022-12-17 NOTE — Evaluation (Signed)
Physical Therapy Evaluation Patient Details Name: Chad Winters MRN: [id number] DOB: 1946/04/01 Today's Date: 12/17/2022  History of Present Illness  77 yo male reporting R hip pain following a fall. 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 Pt under went R THR 12/16/21 with posterior hip precaution and WBAT  Clinical Impression  Pt is near baseline. He has slight instability with sit to stand and gait. He is unsure of himself on the stairs which he has to navigate when at home. Pt was supervision to [place] for all functional mobility. Pt states that he has a lot of family and his grandson can come stay with him over the weekend. Due to pt current functional status and PLOF recommending skilled physical therapy services in HHPT setting on discharge from acute care hospital in order to decrease risk for falls, injury and re-hospitalization. O2 sats and HR remained WNL throughout session.        Recommendations for follow up therapy are one component of a multi-disciplinary discharge planning process, led by the attending physician.  Recommendations may be updated based on patient status, additional functional criteria and insurance authorization.  Follow Up Recommendations Home health PT      Assistance Recommended at Discharge PRN  Patient can return home with the following  A little help with walking and/or transfers;Help with stairs or ramp for entrance;Assist for transportation    Equipment Recommendations Rolling walker (2 wheels)  Recommendations for Other Services       Functional Status Assessment Patient has had a recent decline in their functional status and demonstrates the ability to make significant improvements in function in a reasonable and predictable amount of time.     Precautions / Restrictions Precautions Precautions: Posterior Hip Precaution Booklet Issued: Yes (comment) Precaution Comments: pt  states understanding. Initially did not remember Restrictions Weight Bearing Restrictions: Yes RLE Weight Bearing: Weight bearing as tolerated      Mobility  Bed Mobility Overal bed mobility: Needs Assistance Bed Mobility: Supine to Sit     Supine to sit: HOB elevated, Supervision     General bed mobility comments: use of bed rails Patient Response: Cooperative  Transfers Overall transfer level: Needs assistance Equipment used: Rolling walker (2 wheels) Transfers: Sit to/from Stand, Bed to chair/wheelchair/BSC Sit to Stand: Supervision   Step pivot transfers: Supervision       General transfer comment: verbal cues for safe hand placement with good acceptance    Ambulation/Gait Ambulation/Gait assistance: Supervision Gait Distance (Feet): 150 Feet Assistive device: Rolling walker (2 wheels) Gait Pattern/deviations: Step-to pattern, Step-through pattern, Decreased step length - left, Decreased stance time - right   Gait velocity interpretation: 1.31 - 2.62 ft/sec, indicative of limited community ambulator   General Gait Details: initially CGA gait improved with distance to supervision  Stairs Stairs: Yes Stairs assistance: Min guard Stair Management: One rail Right Number of Stairs: 2 General stair comments: discussed having family/friend when first navigating stairs at home. Pt was nervous about stair navigation but did well and felt more confident  Wheelchair Mobility    Modified Rankin (Stroke Patients Only)       Balance Overall balance assessment: Mild deficits observed, not formally tested           Pertinent Vitals/Pain Pain Assessment Pain Assessment: 0-10 Pain Score: 3  Pain Location: R hip Pain Descriptors / Indicators: Aching Pain Intervention(s): Limited activity within patient's tolerance, Monitored during session    Home Living  Family/patient expects to be discharged to:: Private residence Living Arrangements: Alone Available Help  at Discharge: Family (lives in [place] and [place]. Pt has grandsons in [place] Pt states that he most likely could get a family member to stay a couple of days if needed.) Type of Home: House Home Access: Stairs to enter Entrance Stairs-Rails: Left Entrance Stairs-Number of Steps: 2   Home Layout: One level Home Equipment: Cane - single point;Grab bars - tub/shower      Prior Function Prior Level of Function : Independent/Modified Independent             Mobility Comments: Pt has had 2 falls in 6 months with one resulting in this hospital stay. Pt was driving PTA ADLs Comments: pt states he was performing ADl's independently. Had meals from when he got his whipple done in the freezer that he is still using.     Hand Dominance        Extremity/Trunk Assessment   Upper Extremity Assessment Upper Extremity Assessment: Defer to OT evaluation    Lower Extremity Assessment Lower Extremity Assessment: RLE deficits/detail RLE Deficits / Details: recent THA LLE WFL    Cervical / Trunk Assessment Cervical / Trunk Assessment: Normal  Communication   Communication: No difficulties;HOH  Cognition Arousal/Alertness: Awake/alert Behavior During Therapy: WFL for tasks assessed/performed Overall Cognitive Status: Within Functional Limits for tasks assessed                 Assessment/Plan    PT Assessment Patient needs continued PT services  PT Problem List Decreased strength;Decreased mobility       PT Treatment Interventions DME instruction;Therapeutic exercise;Gait training;Balance training;Manual techniques;Stair training;Functional mobility training;Neuromuscular re-education;Therapeutic activities;Patient/family education    PT Goals (Current goals can be found in the Care Plan section)  Acute Rehab PT Goals Patient Stated Goal: to go home and not go to rehab PT Goal Formulation: With patient Time For Goal Achievement: 12/31/22 Potential to  Achieve Goals: Good    Frequency Min 5X/week        AM-PAC PT "6 Clicks" Mobility  Outcome Measure Help needed turning from your back to your side while in a flat bed without using bedrails?: None Help needed moving from lying on your back to sitting on the side of a flat bed without using bedrails?: A Little Help needed moving to and from a bed to a chair (including a wheelchair)?: A Little Help needed standing up from a chair using your arms (e.g., wheelchair or bedside chair)?: A Little Help needed to walk in hospital room?: A Little Help needed climbing 3-5 steps with a railing? : A Little 6 Click Score: 19    End of Session Equipment Utilized During Treatment: Gait belt Activity Tolerance: Patient tolerated treatment well Patient left: in chair;with call bell/phone within reach;with chair alarm set Nurse Communication: Mobility status PT Visit Diagnosis: Unsteadiness on feet (R26.81)    Time: 0350-0938 PT Time Calculation (min) (ACUTE ONLY): 48 min   Charges:   PT Evaluation $PT Eval Low Complexity: 1 Low PT Treatments $Gait Training: 8-22 mins $Therapeutic Activity: 8-22 mins        Tomma Rakers, DPT, [place] Office: [phone] (Secure chat preferred)   Ander Purpura 12/17/2022, 4:07 PM

## 2022-12-17 NOTE — Plan of Care (Signed)

## 2022-12-17 NOTE — Assessment & Plan Note (Addendum)
Repleted. °

## 2022-12-17 NOTE — Hospital Course (Addendum)
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  He presented after a mechanical fall at home. Imaging revealed a mildly displaced, impacted transcervical fracture right femoral neck.  Orthopedic surgery was consulted and patient was admitted for surgical repair.  He underwent right total hip replacement on 12/16/2022.

## 2022-12-17 NOTE — Assessment & Plan Note (Addendum)
-   s/p mechanical fall at home - s/p right total hip replacement on 12/16/2022 -Follow-up PT eval; home with [facility] - Continue aspirin -Worked well with physical therapy and discharged home with home health

## 2022-12-17 NOTE — Assessment & Plan Note (Signed)
-   Follows with neurology - Continue Topamax

## 2022-12-17 NOTE — Progress Notes (Signed)
Patient with POD 1 s/p right total hip arthroplasty through anterior approach.  Doing well overall this morning states his pain is a lot better today compared with yesterday.  Denies any other complaints such as other joints bothering him, fevers, chills, chest pain/shortness of breath.  He has not ambulated yet or worked with physical therapy as of 7 AM this morning.  On exam, patient has palpable pedal pulses.  No calf tenderness bilaterally.  Intact ankle dorsiflexion and plantarflexion bilaterally.  Dressings intact with no gross blood or drainage.  No knee effusion noted.  No pain with knee range of motion.  Plan is mobilization with physical therapy today.  His intent is to return home ultimately.  He lives alone but he has a daughter who lives 30 minutes away from him who would be able to check on him fairly frequently.  He also has a lot of other family members that frequently come by to his house so he has a lot of social support.  Going to a rehab/skilled nursing facility is the last thing that he wants to do but I did inform him that this may be a necessity for him if he is not able to mobilize sufficiently with physical therapy.  Aspirin for DVT prophylaxis along with SCDs.  He has no history of DVT/PE.  Follow-up with Dr. Marlou Sa in clinic in 2 weeks.

## 2022-12-18 ENCOUNTER — Encounter (HOSPITAL_COMMUNITY): Payer: Self-pay | Admitting: Orthopedic Surgery

## 2022-12-18 DIAGNOSIS — S72001A Fracture of unspecified part of neck of right femur, initial encounter for closed fracture: Secondary | ICD-10-CM | POA: Diagnosis not present

## 2022-12-18 LAB — CBC WITH DIFFERENTIAL/PLATELET
Abs Immature Granulocytes: 0.06 10*3/uL (ref 0.00–0.07)
Basophils Absolute: 0 10*3/uL (ref 0.0–0.1)
Basophils Relative: 0 %
Eosinophils Absolute: 0 10*3/uL (ref 0.0–0.5)
Eosinophils Relative: 0 %
HCT: 26.7 % — ABNORMAL LOW (ref 39.0–52.0)
Hemoglobin: 8.9 g/dL — ABNORMAL LOW (ref 13.0–17.0)
Immature Granulocytes: 1 %
Lymphocytes Relative: 15 %
Lymphs Abs: 1.4 10*3/uL (ref 0.7–4.0)
MCH: 32.4 pg (ref 26.0–34.0)
MCHC: 33.3 g/dL (ref 30.0–36.0)
MCV: 97.1 fL (ref 80.0–100.0)
Monocytes Absolute: 0.7 10*3/uL (ref 0.1–1.0)
Monocytes Relative: 8 %
Neutro Abs: 7 10*3/uL (ref 1.7–7.7)
Neutrophils Relative %: 76 %
Platelets: 291 10*3/uL (ref 150–400)
RBC: 2.75 MIL/uL — ABNORMAL LOW (ref 4.22–5.81)
RDW: 15 % (ref 11.5–15.5)
WBC: 9.2 10*3/uL (ref 4.0–10.5)
nRBC: 0 % (ref 0.0–0.2)

## 2022-12-18 LAB — BASIC METABOLIC PANEL
Anion gap: 8 (ref 5–15)
BUN: 28 mg/dL — ABNORMAL HIGH (ref 8–23)
CO2: 22 mmol/L (ref 22–32)
Calcium: 7.5 mg/dL — ABNORMAL LOW (ref 8.9–10.3)
Chloride: 103 mmol/L (ref 98–111)
Creatinine, Ser: 1.91 mg/dL — ABNORMAL HIGH (ref 0.61–1.24)
GFR, Estimated: 36 mL/min — ABNORMAL LOW (ref >60)
Glucose, Bld: 81 mg/dL (ref 70–99)
Potassium: 3.4 mmol/L — ABNORMAL LOW (ref 3.5–5.1)
Sodium: 133 mmol/L — ABNORMAL LOW (ref 135–145)

## 2022-12-18 LAB — MAGNESIUM: Magnesium: 1.4 mg/dL — ABNORMAL LOW (ref 1.7–2.4)

## 2022-12-18 MED ORDER — SODIUM CHLORIDE 0.9 % IV SOLN: INTRAVENOUS | Status: AC

## 2022-12-18 MED ORDER — POTASSIUM CHLORIDE CRYS ER 20 MEQ PO TBCR
40.0000 meq | EXTENDED_RELEASE_TABLET | Freq: Once | ORAL | Status: AC
  Administered 2022-12-18: 40 meq via ORAL
  Filled 2022-12-18: qty 2

## 2022-12-18 MED ORDER — MAGNESIUM SULFATE 2 GM/50ML IV SOLN
2.0000 g | Freq: Once | INTRAVENOUS | Status: AC
  Administered 2022-12-18: 2 g via INTRAVENOUS
  Filled 2022-12-18: qty 50

## 2022-12-18 NOTE — Progress Notes (Signed)
Progress Note    Chad Winters   ALP:[id number]  DOB: August 23, 1946  DOA: 12/16/2022     2 PCP: Pcp, No  Initial CC: Fall at home  Hospital Course: Mr. 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  He presented after a mechanical fall at home. Imaging revealed a mildly displaced, impacted transcervical fracture right femoral neck.  Orthopedic surgery was consulted and patient was admitted for surgical repair.  He underwent right total hip replacement on 12/16/2022.  Interval History:  No events overnight.  Seems to be planning for discharging home, he is wanting to shoot for Sunday.  He hopes to work with PT further prior to discharge.  Assessment and Plan: Closed displaced fracture of right femoral neck ([place]) - s/p mechanical fall at home - s/p right total hip replacement on 12/16/2022 -Follow-up PT eval; [facility] with [facility] - Continue aspirin - Patient preferring for going home at discharge; he feels he may be ready by Sunday; will try and see if PT can keep working some with him until then  HLD (hyperlipidemia) - Continue Lipitor  HTN (hypertension) - Continue amlodipine  History of migraine headaches - Follows with neurology - Continue Topamax  Ankylosing spondylitis ([place]) - Follows with rheumatology outpatient - Continue Humira  Hypokalemia - Replete as needed    Old records reviewed in assessment of this patient  Antimicrobials:   DVT prophylaxis:  SCDs Start: 12/16/22 2311   Code Status:   Code Status: Full Code  Mobility Assessment (last 72 hours)     Mobility Assessment     Row Name 12/18/22 0930 12/17/22 1604 12/17/22  0758 12/16/22 2323     Does patient have an order for bedrest or is patient medically unstable No - Continue assessment -- No - Continue assessment No - Continue assessment    What is the highest level of mobility based on the progressive mobility assessment? Level 5 (Walks with assist in room/hall) - Balance while stepping forward/back and can walk in room with assist - Complete Level 5 (Walks with assist in room/hall) - Balance while stepping forward/back and can walk in room with assist - Complete Level 2 ([place]) - Balance while sitting on edge of bed and cannot stand Level 2 ([place]) - Balance while sitting on edge of bed and cannot stand    Is the above level different from baseline mobility prior to current illness? Yes - Recommend PT order -- Yes - Recommend PT order Yes - Recommend PT order             Barriers to discharge:  Disposition Plan: Pending PT eval Status is: Inpatient  Objective: Blood pressure (!) 145/80, pulse 61, temperature 98.3 F (36.8 C), resp. rate 18, height '5\' 8"'$  (1.727 m), weight 62 kg, SpO2 100 %.  Examination:  Physical Exam Constitutional:      General: He is not in acute distress.    Appearance: Normal appearance.     Comments: Hard of hearing in general  HENT:     Head: Normocephalic and atraumatic.     Mouth/Throat:     Mouth: Mucous membranes are moist.  Eyes:     Extraocular Movements: Extraocular movements intact.  Cardiovascular:  Rate and Rhythm: Normal rate and regular rhythm.     Heart sounds: Normal heart sounds.  Pulmonary:     Effort: Pulmonary effort is normal. No respiratory distress.     Breath sounds: Normal breath sounds. No wheezing.  Abdominal:     General: Bowel sounds are normal. There is no distension.     Palpations: Abdomen is soft.     Tenderness: There is no abdominal tenderness.  Musculoskeletal:        General: Normal range of motion.     Cervical back: Normal range of motion and neck supple.      Comments: Surgical dressing in place on right lateral thigh, compartments soft  Skin:    General: Skin is warm and dry.  Neurological:     General: No focal deficit present.     Mental Status: He is alert.  Psychiatric:        Mood and Affect: Mood normal.        Behavior: Behavior normal.      Consultants:  Orthopedic surgery  Procedures:  12/16/2022: Right total hip replacement  Data Reviewed: Results for orders placed or performed during the hospital encounter of 12/16/22 (from the past 24 hour(s))  Basic metabolic panel     Status: Abnormal   Collection Time: 12/18/22  5:14 AM  Result Value Ref Range   Sodium 133 (L) 135 - 145 mmol/L   Potassium 3.4 (L) 3.5 - 5.1 mmol/L   Chloride 103 98 - 111 mmol/L   CO2 22 22 - 32 mmol/L   Glucose, Bld 81 70 - 99 mg/dL   BUN 28 (H) 8 - 23 mg/dL   Creatinine, Ser 1.91 (H) 0.61 - 1.24 mg/dL   Calcium 7.5 (L) 8.9 - 10.3 mg/dL   GFR, Estimated 36 (L) >60 mL/min   Anion gap 8 5 - 15  CBC with Differential/Platelet     Status: Abnormal   Collection Time: 12/18/22  5:14 AM  Result Value Ref Range   WBC 9.2 4.0 - 10.5 K/uL   RBC 2.75 (L) 4.22 - 5.81 MIL/uL   Hemoglobin 8.9 (L) 13.0 - 17.0 g/dL   HCT 26.7 (L) 39.0 - 52.0 %   MCV 97.1 80.0 - 100.0 fL   MCH 32.4 26.0 - 34.0 pg   MCHC 33.3 30.0 - 36.0 g/dL   RDW 15.0 11.5 - 15.5 %   Platelets 291 150 - 400 K/uL   nRBC 0.0 0.0 - 0.2 %   Neutrophils Relative % 76 %   Neutro Abs 7.0 1.7 - 7.7 K/uL   Lymphocytes Relative 15 %   Lymphs Abs 1.4 0.7 - 4.0 K/uL   Monocytes Relative 8 %   Monocytes Absolute 0.7 0.1 - 1.0 K/uL   Eosinophils Relative 0 %   Eosinophils Absolute 0.0 0.0 - 0.5 K/uL   Basophils Relative 0 %   Basophils Absolute 0.0 0.0 - 0.1 K/uL   Immature Granulocytes 1 %   Abs Immature Granulocytes 0.06 0.00 - 0.07 K/uL  Magnesium     Status: Abnormal   Collection Time: 12/18/22  5:14 AM  Result Value Ref Range   Magnesium 1.4 (L) 1.7 - 2.4 mg/dL    I have reviewed  pertinent nursing notes, vitals, labs, and images as necessary. I have ordered labwork to follow up on as indicated.  I have reviewed the last notes from staff over past 24 hours. I have discussed patient's care plan and test results with nursing staff, CM/SW,  and other staff as appropriate.  Time spent: Greater than 50% of the 55 minute visit was spent in counseling/coordination of care for the patient as laid out in the A&P.   LOS: 2 days   Dwyane Dee, MD [facility] 12/18/2022, 3:05 PM

## 2022-12-18 NOTE — Care Management Important Message (Signed)
Important Message  Patient Details  Name: Chad Winters MRN: [id number] Date of Birth: [birth year]   Medicare Important Message Given:  Yes     Hannah Beat 12/18/2022, 12:56 PM

## 2022-12-18 NOTE — Progress Notes (Signed)
  Subjective: Chad Winters is a 77 y.o. male s/p right THA.  They are POD 2.  Pt's pain is controlled.  Pt has ambulated with some difficulty.  Was able to ambulate about 150 feet with physical therapy yesterday.  He is also able to do stairs.  Denies any chest pain or shortness of breath or abdominal pain.  No calf pain.  Has had increased to his creatinine up to 1.9 today.  Objective: Vital signs in last 24 hours: Temp:  [97.5 F (36.4 C)-98.2 F (36.8 C)] 98.2 F (36.8 C) (01/05 0431) Pulse Rate:  [58-68] 68 (01/05 0900) Resp:  [18-20] 19 (01/05 0900) BP: (113-144)/(63-80) 144/71 (01/05 0900) SpO2:  [97 %-100 %] 98 % (01/05 0900)  Intake/Output from previous day: 01/04 0701 - 01/05 0700 In: 2160.1 [P.O.:1230; I.V.:830.1; IV Piggyback:100] Out: 1100 [Urine:1100] Intake/Output this shift: No intake/output data recorded.  Exam:  No gross blood or drainage overlying the dressing 2+ DP pulse Sensation intact distally in the right foot Able to dorsiflex and plantarflex the right foot No calf tenderness.  Negative Homans' sign.  Leg lengths are equal.   Labs: Recent Labs    12/16/22 1505 12/16/22 1914 12/17/22 0347 12/18/22 0514  HGB 12.2* 12.6* 10.1* 8.9*   Recent Labs    12/17/22 0347 12/18/22 0514  WBC 11.2* 9.2  RBC 3.17* 2.75*  HCT 30.4* 26.7*  PLT 292 291   Recent Labs    12/17/22 0347 12/18/22 0514  NA 130* 133*  K 3.5 3.4*  CL 103 103  CO2 21* 22  BUN 16 28*  CREATININE 1.17 1.91*  GLUCOSE 168* 81  CALCIUM 7.3* 7.5*   No results for input(s): "LABPT", "INR" in the last 72 hours.  Assessment/Plan: Pt is POD 2 s/p right THA.    -Discharge per hospital team, appreciate their management.  Based on his activity level, seems he should be able to go home with home health therapy as long as he does not have any setbacks.  -WBAT with a walker  -Aspirin and SCDs for DVT prophylaxis.  -Follow-up with Dr. Marlou Sa in clinic 2 weeks postop     [facility] 12/18/2022, 11:57 AM

## 2022-12-18 NOTE — Progress Notes (Signed)
Mobility Specialist Progress Note   12/18/22 1634  Mobility  Activity Ambulated with assistance in hallway  Level of Assistance Minimal assist, patient does 75% or more  Assistive Device Front wheel walker  Distance Ambulated (ft) 210 ft  RLE Weight Bearing WBAT  Activity Response Tolerated well  Mobility Referral Yes  $Mobility charge 1 Mobility   Received pt in bed sleep but easily awaken and agreeable. Requiring minA to get EOB d/t decreased pain tolerance in RLE. CGA for ambulation d/t antalgic like gait and increase in pain. Returned back to bed w/o fault and all needs met.  Holland Falling Mobility Specialist Please contact via SecureChat or  Rehab office at [phone]

## 2022-12-18 NOTE — Progress Notes (Signed)
Physical Therapy Treatment Patient Details Name: Chad Winters MRN: [id number] DOB: 1946/12/13 Today's Date: 12/18/2022   History of Present Illness 77 yo male reporting R hip pain following a fall. 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 Pt under went R THR 12/16/21 with posterior hip precautions and WBAT.    PT Comments    Pt received in supine, pleasantly agreeable to therapy session and with good recall of safety instructions from previous session for body mechanics, did need a reminder to recall 1/3 posterior hip precautions. Pt mobilizing well with RW needing mostly Supervision for safety cues. Encouraged him to arrange assist at home for the first couple of days overnight to ensure he feels safe and to assist with ADLs given pt pain/fatigue increased from baseline. Mobility specialist and nursing staff notified he would benefit from frequent mobilization daily. Pt continues to benefit from PT services to progress toward functional mobility goals.    Recommendations for follow up therapy are one component of a multi-disciplinary discharge planning process, led by the attending physician.  Recommendations may be updated based on patient status, additional functional criteria and insurance authorization.  Follow Up Recommendations  Home health PT     Assistance Recommended at Discharge PRN  Patient can return home with the following A little help with walking and/or transfers;Help with stairs or ramp for entrance;Assist for transportation   Equipment Recommendations  Rolling walker (2 wheels)    Recommendations for Other Services       Precautions / Restrictions Precautions Precautions: Posterior Hip Precaution Booklet Issued: Yes (comment) Precaution Comments: Pt recalled 2/3 with hints Restrictions Weight Bearing Restrictions: Yes RLE Weight Bearing: Weight bearing as tolerated     Mobility  Bed  Mobility Overal bed mobility: Needs Assistance Bed Mobility: Supine to Sit     Supine to sit: HOB elevated, Supervision     General bed mobility comments: use of bed rails, HOB ~20*    Transfers Overall transfer level: Needs assistance Equipment used: Rolling walker (2 wheels) Transfers: Sit to/from Stand, Bed to chair/wheelchair/BSC Sit to Stand: Supervision   Step pivot transfers: Supervision       General transfer comment: verbal cues for safe hand placement with good acceptance. fair eccentric control to sit    Ambulation/Gait Ambulation/Gait assistance: Supervision Gait Distance (Feet): 180 Feet Assistive device: Rolling walker (2 wheels) Gait Pattern/deviations: Step-to pattern, Step-through pattern, Decreased step length - left, Decreased stance time - right Gait velocity: <0.4 m/s; variable     General Gait Details: good RW management, slow pace, postural cues given; no overt LOB   Stairs Stairs: Yes Stairs assistance: Supervision, Min guard Stair Management: One rail Left, Step to pattern, Forwards Number of Stairs: 4 (2 steps in PT gym x2 trials) General stair comments: discussed having family/friend when first navigating stairs at home; min guard to ascend initially then supervision on second trial, pt able to recall LE sequencing from previous session   Wheelchair Mobility    Modified Rankin (Stroke Patients Only)       Balance Overall balance assessment: Mild deficits observed, not formally tested                                          Cognition Arousal/Alertness: Awake/alert Behavior During Therapy: WFL for tasks assessed/performed Overall Cognitive Status: Within Functional Limits for tasks assessed  Exercises Other Exercises Other Exercises: seated BLE AROM: LAQ, ankle pumps x10 reps ea    General Comments General comments (skin integrity, edema, etc.):  incision c/d/i, no dizziness and HR/SpO2 [facility]      Pertinent Vitals/Pain Pain Assessment Pain Assessment: Faces Faces Pain Scale: Hurts little more Pain Location: R hip Pain Descriptors / Indicators: Grimacing, Aching Pain Intervention(s): Monitored during session, Repositioned     PT Goals (current goals can now be found in the care plan section) Acute Rehab PT Goals Patient Stated Goal: to go home and not go to rehab PT Goal Formulation: With patient Time For Goal Achievement: 12/31/22 Progress towards PT goals: Progressing toward goals    Frequency    Min 5X/week      PT Plan Current plan remains appropriate       AM-PAC PT "6 Clicks" Mobility   Outcome Measure  Help needed turning from your back to your side while in a flat bed without using bedrails?: None Help needed moving from lying on your back to sitting on the side of a flat bed without using bedrails?: A Little Help needed moving to and from a bed to a chair (including a wheelchair)?: A Little Help needed standing up from a chair using your arms (e.g., wheelchair or bedside chair)?: A Little Help needed to walk in hospital room?: A Little Help needed climbing 3-5 steps with a railing? : A Little 6 Click Score: 19    End of Session Equipment Utilized During Treatment: Gait belt Activity Tolerance: Patient tolerated treatment well Patient left: in chair;with call bell/phone within reach;with chair alarm set Nurse Communication: Mobility status;Other (comment) (would benefit to ambulate with mobility and nursing staff daily) PT Visit Diagnosis: Unsteadiness on feet (R26.81)     Time: 0034-9179 PT Time Calculation (min) (ACUTE ONLY): 32 min  Charges:  $Gait Training: 8-22 mins $Therapeutic Exercise: 8-22 mins                     Chanler Mendonca P., PTA Acute Rehabilitation Services Secure Chat Preferred 9a-5:30pm Office: [place] 12/18/2022, 5:43 PM

## 2022-12-18 NOTE — TOC Initial Note (Addendum)
Transition of Care [facility]) - Initial/Assessment Note    Patient Details  Name: Chad Winters MRN: [id number] Date of Birth: 1946-10-24  Transition of [facility]) CM/SW Contact:    Ninfa Meeker, RN Phone Number: 12/18/2022, 10:33 AM  Clinical Narrative:                 Patient is a 77 yr old gentleman, Hard of hearing,  admitted right femur fracture after a fall at home. Underwent Right total hip arthroplasty. Case Manager spoke with patient [name] versus SNF. Patient is adamant that he will not go to a rehab facility, wants to go home. He states his daughter and other relatives will assist as needed. Discussed Home Health agencies, referral called to Adela Lank, [facility] Liaison.He requests that they call him on cell phone [phone], CM gave this number to [facility]. Patient states he needs a rolling walker, doesn't want a 3in1. CM will follow for further needs.      Expected Discharge Plan: [place] Barriers to Discharge: No Barriers Identified   Patient Goals and CMS Choice Patient states their goals for this hospitalization and ongoing recovery are:: go home   Choice offered to / list presented to : Patient      Expected Discharge Plan and Services   Discharge Planning Services: CM Consult Post Acute Care Choice: Durable Medical Equipment Living arrangements for the past 2 months: Single Family Home                 DME Arranged: Walker rolling DME Agency: [employer] Date DME Agency Contacted: 12/18/22 Time DME Agency Contacted: [id number] Representative spoke with at DME Agency: Cyril Mourning HH Arranged: PT, OT [place] Agency: [place] Date [place]: 12/18/22 Time [place]: 41 Representative spoke with at [place]: Adela Lank  Prior Living Arrangements/Services Living arrangements for the past 2 months: [place] with:: Self Patient language and need for interpreter reviewed:: Yes Do you  feel safe going back to the place where you live?: Yes      Need for Family Participation in Patient Care: Yes (Comment) Care giver support system in place?: Yes (comment) (has lot of relatives that will be assisting him)   Criminal Activity/Legal Involvement Pertinent to Current Situation/Hospitalization: No - Comment as needed  Activities of Daily Living Home Assistive Devices/Equipment: Eyeglasses, Cane (specify quad or straight) ADL Screening (condition at time of admission) Patient's cognitive ability adequate to safely complete daily activities?: Yes Is the patient deaf or have difficulty hearing?: Yes Does the patient have difficulty seeing, even when wearing glasses/contacts?: No Does the patient have difficulty concentrating, remembering, or making decisions?: No Patient able to express need for assistance with ADLs?: Yes Does the patient have difficulty dressing or bathing?: No Independently performs ADLs?: Yes (appropriate for developmental age) Does the patient have difficulty walking or climbing stairs?: No Weakness of Legs: Both Weakness of Arms/Hands: None  Permission Sought/Granted         Permission granted to share info w AGENCY: Alvis Lemmings        Emotional Assessment   Attitude/Demeanor/Rapport: Gracious   Orientation: : Oriented to Self, Oriented to Place, Oriented to  Time, Oriented to Situation Alcohol / Substance Use: Not Applicable Psych Involvement: No (comment)  Admission diagnosis:  Hip fracture ([place]) [S72.009A] Closed fracture of neck of right femur, initial encounter ([employer]) [S72.001A] Patient Active Problem List   Diagnosis Date Noted   History of  migraine headaches 12/17/2022   HTN (hypertension) 12/17/2022   HLD (hyperlipidemia) 12/17/2022   Hip fracture ([place]) 12/16/2022   Closed displaced fracture of right femoral neck ([place]) 12/16/2022   Hypokalemia 12/16/2022   Ankylosing spondylitis ([place]) 12/16/2022   Immunosuppressed status ([place]) 12/16/2022    PCP:  Pcp, No Pharmacy:   [facility] [place], [address] [place] [address] Phone: [phone] Fax: [phone]     Social Determinants of Health (SDOH) Social History: SDOH Screenings   Food Insecurity: No Food Insecurity (12/17/2022)  Housing: Low Risk  (12/17/2022)  Transportation Needs: No Transportation Needs (12/17/2022)  Utilities: Not At Risk (12/17/2022)  Tobacco Use: High Risk (12/18/2022)   SDOH Interventions:     Readmission Risk Interventions     No data to display

## 2022-12-19 DIAGNOSIS — S72001A Fracture of unspecified part of neck of right femur, initial encounter for closed fracture: Secondary | ICD-10-CM | POA: Diagnosis not present

## 2022-12-19 LAB — MAGNESIUM: Magnesium: 2.1 mg/dL (ref 1.7–2.4)

## 2022-12-19 LAB — BASIC METABOLIC PANEL
Anion gap: 5 (ref 5–15)
BUN: 29 mg/dL — ABNORMAL HIGH (ref 8–23)
CO2: 24 mmol/L (ref 22–32)
Calcium: 7.7 mg/dL — ABNORMAL LOW (ref 8.9–10.3)
Chloride: 105 mmol/L (ref 98–111)
Creatinine, Ser: 1.63 mg/dL — ABNORMAL HIGH (ref 0.61–1.24)
GFR, Estimated: 44 mL/min — ABNORMAL LOW (ref >60)
Glucose, Bld: 107 mg/dL — ABNORMAL HIGH (ref 70–99)
Potassium: 4.4 mmol/L (ref 3.5–5.1)
Sodium: 134 mmol/L — ABNORMAL LOW (ref 135–145)

## 2022-12-19 LAB — CBC WITH DIFFERENTIAL/PLATELET
Abs Immature Granulocytes: 0.09 10*3/uL — ABNORMAL HIGH (ref 0.00–0.07)
Basophils Absolute: 0 10*3/uL (ref 0.0–0.1)
Basophils Relative: 0 %
Eosinophils Absolute: 0.1 10*3/uL (ref 0.0–0.5)
Eosinophils Relative: 1 %
HCT: 24.8 % — ABNORMAL LOW (ref 39.0–52.0)
Hemoglobin: 8.4 g/dL — ABNORMAL LOW (ref 13.0–17.0)
Immature Granulocytes: 1 %
Lymphocytes Relative: 14 %
Lymphs Abs: 1.1 10*3/uL (ref 0.7–4.0)
MCH: 32.6 pg (ref 26.0–34.0)
MCHC: 33.9 g/dL (ref 30.0–36.0)
MCV: 96.1 fL (ref 80.0–100.0)
Monocytes Absolute: 0.8 10*3/uL (ref 0.1–1.0)
Monocytes Relative: 10 %
Neutro Abs: 5.6 10*3/uL (ref 1.7–7.7)
Neutrophils Relative %: 74 %
Platelets: 277 10*3/uL (ref 150–400)
RBC: 2.58 MIL/uL — ABNORMAL LOW (ref 4.22–5.81)
RDW: 15 % (ref 11.5–15.5)
WBC: 7.7 10*3/uL (ref 4.0–10.5)
nRBC: 0 % (ref 0.0–0.2)

## 2022-12-19 MED ORDER — LACTULOSE 10 GM/15ML PO SOLN: 20.0000 g | Freq: Two times a day (BID) | ORAL | Status: DC | PRN

## 2022-12-19 MED ORDER — SENNOSIDES-DOCUSATE SODIUM 8.6-50 MG PO TABS
1.0000 | ORAL_TABLET | Freq: Two times a day (BID) | ORAL | Status: DC
  Administered 2022-12-19 – 2022-12-20 (×3): 1 via ORAL
  Filled 2022-12-19 (×3): qty 1

## 2022-12-19 MED ORDER — POLYETHYLENE GLYCOL 3350 17 G PO PACK
17.0000 g | PACK | Freq: Every day | ORAL | Status: DC | PRN
  Administered 2022-12-19: 17 g via ORAL
  Filled 2022-12-19: qty 1

## 2022-12-19 MED ORDER — ALUM & MAG HYDROXIDE-SIMETH 200-200-20 MG/5ML PO SUSP: 30.0000 mL | Freq: Four times a day (QID) | ORAL | Status: DC | PRN

## 2022-12-19 NOTE — Progress Notes (Signed)
Mobility Specialist Progress Note   12/19/22 1545  Mobility  Activity Stood at bedside;Ambulated with assistance in room  Level of Assistance Minimal assist, patient does 75% or more  Assistive Device Front wheel walker  Distance Ambulated (ft) 20 ft  Range of Motion/Exercises Active;All extremities  RLE Weight Bearing WBAT  Activity Response Tolerated well   Patient received in supine and agreeable to participate. Requires more assistance than prior sessions and distance limited secondary to pain. Stood and ambulated with min A and slow gait. Returned to supine without complaint or incident. Was left with all needs met, call bell in reach.   [place] Solara Goodchild, BS EXP Mobility Specialist Please contact via SecureChat or Rehab office at [phone]

## 2022-12-19 NOTE — Progress Notes (Signed)
Physical Therapy Treatment Patient Details Name: Chad Winters MRN: [id number] DOB: [birth year] Today's Date: 12/19/2022   History of Present Illness 77 yo male reporting R hip pain following a fall. 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 Pt under went R THR 12/16/21 with posterior hip precautions and WBAT.    PT Comments    Pt supine in bed on arrival in good spirits.  Post exercises and into standing he reports feeling tight and stiff with pain noted as 7/10.  GT was limited this session and RN informed of patients pain and need for medication intervention.  Will f/u per POC with pain meds on board.     Recommendations for follow up therapy are one component of a multi-disciplinary discharge planning process, led by the attending physician.  Recommendations may be updated based on patient status, additional functional criteria and insurance authorization.  Follow Up Recommendations  Home health PT     Assistance Recommended at Discharge    Patient can return home with the following A little help with walking and/or transfers;Help with stairs or ramp for entrance;Assist for transportation   Equipment Recommendations  Rolling walker (2 wheels)    Recommendations for Other Services       Precautions / Restrictions Precautions Precautions: Posterior Hip Precaution Booklet Issued: Yes (comment) Precaution Comments: Pt able to recall 3/3 hip precautions but noted with legs crossed on arrival and at end of tx with cues to avoid this position. Restrictions Weight Bearing Restrictions: Yes RLE Weight Bearing: Weight bearing as tolerated     Mobility  Bed Mobility Overal bed mobility: Needs Assistance Bed Mobility: Supine to Sit       Sit to supine: Supervision   General bed mobility comments: Increased time and effort this session to move to edge of bed.    Transfers Overall transfer level: Needs  assistance Equipment used: Rolling walker (2 wheels) Transfers: Sit to/from Stand Sit to Stand: Supervision           General transfer comment: verbal cues for safe hand placement with good acceptance. fair eccentric control to sit.  Cues for RLE advancement forward to maintain posterior hip precautions.    Ambulation/Gait Ambulation/Gait assistance: Min assist Gait Distance (Feet): 20 Feet Assistive device: Rolling walker (2 wheels) Gait Pattern/deviations: Step-to pattern, Decreased step length - left, Decreased stance time - right       General Gait Details: Pt regressed to step to pattern this session.  Pt with increased pain which severely limited his progress.   Pt returned back to room in recliner with ice applied and PTA requesting pain meds.   Stairs Stairs:  (unable due to pain.)           Wheelchair Mobility    Modified Rankin (Stroke Patients Only)       Balance Overall balance assessment: Mild deficits observed, not formally tested                                          Cognition Arousal/Alertness: Awake/alert Behavior During Therapy: WFL for tasks assessed/performed Overall Cognitive Status: Within Functional Limits for tasks assessed  Exercises Total Joint Exercises Ankle Circles/Pumps: AROM, Both, 10 reps, Supine Quad Sets: AROM, Both, 10 reps, Supine Heel Slides: AROM, AAROM, Both, 10 reps, Supine Hip ABduction/ADduction: AROM, AAROM, Both, 10 reps, Supine    General Comments        Pertinent Vitals/Pain Pain Assessment Pain Assessment: 0-10 Pain Score: 7  Pain Location: R hip Pain Descriptors / Indicators: Tightness, Sore Pain Intervention(s): Monitored during session, Repositioned    Home Living                          Prior Function            PT Goals (current goals can now be found in the care plan section) Acute Rehab PT  Goals Patient Stated Goal: to go home and not go to rehab Potential to Achieve Goals: Good Progress towards PT goals: Progressing toward goals    Frequency    Min 5X/week      PT Plan Current plan remains appropriate    Co-evaluation              AM-PAC PT "6 Clicks" Mobility   Outcome Measure  Help needed turning from your back to your side while in a flat bed without using bedrails?: None Help needed moving from lying on your back to sitting on the side of a flat bed without using bedrails?: A Little Help needed moving to and from a bed to a chair (including a wheelchair)?: A Little Help needed standing up from a chair using your arms (e.g., wheelchair or bedside chair)?: A Little Help needed to walk in hospital room?: A Little Help needed climbing 3-5 steps with a railing? : A Little 6 Click Score: 19    End of Session Equipment Utilized During Treatment: Gait belt Activity Tolerance: Patient tolerated treatment well Patient left: in chair;with call bell/phone within reach;with chair alarm set Nurse Communication: Mobility status PT Visit Diagnosis: Unsteadiness on feet (R26.81)     Time: 1340-1409 PT Time Calculation (min) (ACUTE ONLY): 29 min  Charges:  $Gait Training: 8-22 mins $Therapeutic Exercise: 8-22 mins                     Erasmo Leventhal , PTA Acute Rehabilitation Services Office [phone]    Ulla Mckiernan Eli Hose 12/19/2022, 2:22 PM

## 2022-12-19 NOTE — Plan of Care (Signed)

## 2022-12-19 NOTE — Progress Notes (Signed)
  Subjective: Chad Winters is a 77 y.o. male s/p right THA.  They are POD 3.  Pt's pain is controlled.  Pt has ambulated with some difficulty.  Able to ambulate greater than 100 feet with physical therapy yesterday.  Anticipated discharge date is Sunday.  He is ready for discharge from according to him.  Denies any chest pain, shortness of breath, calf pain, abdominal pain.  No fevers or chills.  No bowel movement yet.  Objective: Vital signs in last 24 hours: Temp:  [98 F (36.7 C)-98.3 F (36.8 C)] 98 F (36.7 C) (01/06 0840) Pulse Rate:  [61-68] 68 (01/06 0840) Resp:  [18-20] 20 (01/06 0840) BP: (145-148)/(74-80) 148/74 (01/06 0840) SpO2:  [95 %-100 %] 95 % (01/06 0840)  Intake/Output from previous day: 01/05 0701 - 01/06 0700 In: 495.7 [P.O.:240; I.V.:255.7] Out: 350 [Urine:350] Intake/Output this shift: Total I/O In: 440 [P.O.:440] Out: 300 [Urine:300]  Exam:  No gross blood or drainage overlying the dressing 2+ DP pulse Sensation intact distally in the right foot Able to dorsiflex and plantarflex the right foot No calf tenderness bilaterally.  Negative Homans' sign bilaterally.  Leg lengths are equal.   Labs: Recent Labs    12/16/22 1505 12/16/22 1914 12/17/22 0347 12/18/22 0514 12/19/22 0343  HGB 12.2* 12.6* 10.1* 8.9* 8.4*   Recent Labs    12/18/22 0514 12/19/22 0343  WBC 9.2 7.7  RBC 2.75* 2.58*  HCT 26.7* 24.8*  PLT 291 277   Recent Labs    12/18/22 0514 12/19/22 0343  NA 133* 134*  K 3.4* 4.4  CL 103 105  CO2 22 24  BUN 28* 29*  CREATININE 1.91* 1.63*  GLUCOSE 81 107*  CALCIUM 7.5* 7.7*   No results for input(s): "LABPT", "INR" in the last 72 hours.  Assessment/Plan: Pt is POD 3 s/p right THA.    -Plan to discharge to home in coming days per hospitalist team.  -WBAT with a walker  -Discharge with home health physical therapy.  Follow-up with Dr. Marlou Sa in clinic 2 weeks postoperatively.  May shower with waterproof dressing on but no  submersion underwater such as in bath/pool/hot tub.  -Recommend aspirin twice daily for DVT prophylaxis.  Recommend oxycodone 5 mg every 4-6 hours as needed for pain control.  -No bowel movement yet today, patient would like to try MiraLAX.     Luke Azaan Leask 12/19/2022, 11:39 AM

## 2022-12-19 NOTE — Progress Notes (Signed)
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  He presented after a mechanical fall at home. Imaging revealed a mildly displaced, impacted transcervical fracture right femoral neck.  Orthopedic surgery was consulted and patient was admitted for surgical repair.  He underwent right total hip replacement on 12/16/2022.  Interval History:  No events overnight.  Continues to mobilize with physical therapy.  He is planning for discharging home on Sunday.  Assessment and Plan: * Closed displaced fracture of right femoral neck (Chad Winters) - s/p mechanical fall at home - s/p right total hip replacement on 12/16/2022 -Follow-up PT eval; home with Chad Winters - [place] - Continue aspirin - Patient preferring for going home at discharge; he feels he may be ready by Sunday; will try and see if PT can keep working some with him until then  HLD (hyperlipidemia) - Continue Lipitor  HTN (hypertension) - Continue amlodipine  History of migraine headaches - Follows with neurology - Continue Topamax  Ankylosing spondylitis (Chad Winters) - Follows with rheumatology outpatient - Continue Humira  Hypokalemia - Replete as needed    Old records reviewed in assessment of this patient  Antimicrobials:   DVT prophylaxis:  SCDs Start: 12/16/22 2311   Code Status:   Code Status: Full Code  Mobility Assessment (last 72 hours)     Mobility Assessment     Row Name 12/19/22 0900 12/18/22 1700 12/18/22 0930 12/17/22 1604 12/17/22 0758    Does patient have an order for bedrest or is patient medically unstable No - Continue assessment -- No - Continue assessment -- No - Continue assessment   What is the highest level of mobility based on the progressive mobility assessment? -- Level 5 (Walks with assist in room/hall) - Balance while stepping forward/back and can walk in room with assist - Complete Level 5 (Walks with assist in room/hall) - Balance while stepping forward/back and can walk in room with assist - Complete Level 5 (Walks with assist in room/hall) - Balance while stepping forward/back and can walk in room with assist - Complete Level 2 (Chairfast) - Balance while sitting on edge of bed and cannot stand   Is the above level different from baseline mobility prior to current illness? Yes - Recommend PT order -- Yes - Recommend PT order -- Yes - Recommend PT order    [place] Name 12/16/22 2323           Does patient have an order for bedrest or is patient medically unstable No - Continue assessment       What is the highest level of mobility based on the progressive mobility assessment? Level 2 (Chairfast) - Balance while sitting on edge of bed and cannot stand       Is the above level different from baseline mobility prior to current illness? Yes - Recommend PT order                Barriers to discharge:  Disposition Plan: Home Sunday  Status is: Inpatient  Objective: Blood pressure 130/70, pulse 64, temperature 98 F (  36.7 C), temperature source Oral, resp. rate 18, height '5\' 8"'$  (1.727 m), weight 62 kg, SpO2 96 %.  Examination:  Physical Exam Constitutional:      General: He is not in acute distress.    Appearance: Normal appearance.     Comments: Hard of hearing in general  HENT:     Head: Normocephalic and atraumatic.     Mouth/Throat:     Mouth: Mucous membranes are moist.  Eyes:     Extraocular Movements: Extraocular movements intact.  Cardiovascular:     Rate and Rhythm: Normal rate and regular rhythm.      Heart sounds: Normal heart sounds.  Pulmonary:     Effort: Pulmonary effort is normal. No respiratory distress.     Breath sounds: Normal breath sounds. No wheezing.  Abdominal:     General: Bowel sounds are normal. There is no distension.     Palpations: Abdomen is soft.     Tenderness: There is no abdominal tenderness.  Musculoskeletal:        General: Normal range of motion.     Cervical back: Normal range of motion and neck supple.     Comments: Surgical dressing in place on right lateral thigh, compartments soft  Skin:    General: Skin is warm and dry.  Neurological:     General: No focal deficit present.     Mental Status: He is alert.  Psychiatric:        Mood and Affect: Mood normal.        Behavior: Behavior normal.      Consultants:  Orthopedic surgery  Procedures:  12/16/2022: Right total hip replacement  Data Reviewed: Results for orders placed or performed during the Winters encounter of 12/16/22 (from the past 24 hour(s))  Basic metabolic panel     Status: Abnormal   Collection Time: 12/19/22  3:43 AM  Result Value Ref Range   Sodium 134 (L) 135 - 145 mmol/L   Potassium 4.4 3.5 - 5.1 mmol/L   Chloride 105 98 - 111 mmol/L   CO2 24 22 - 32 mmol/L   Glucose, Bld 107 (H) 70 - 99 mg/dL   BUN 29 (H) 8 - 23 mg/dL   Creatinine, Ser 1.63 (H) 0.61 - 1.24 mg/dL   Calcium 7.7 (L) 8.9 - 10.3 mg/dL   GFR, Estimated 44 (L) >60 mL/min   Anion gap 5 5 - 15  CBC with Differential/Platelet     Status: Abnormal   Collection Time: 12/19/22  3:43 AM  Result Value Ref Range   WBC 7.7 4.0 - 10.5 K/uL   RBC 2.58 (L) 4.22 - 5.81 MIL/uL   Hemoglobin 8.4 (L) 13.0 - 17.0 g/dL   HCT 24.8 (L) 39.0 - 52.0 %   MCV 96.1 80.0 - 100.0 fL   MCH 32.6 26.0 - 34.0 pg   MCHC 33.9 30.0 - 36.0 g/dL   RDW 15.0 11.5 - 15.5 %   Platelets 277 150 - 400 K/uL   nRBC 0.0 0.0 - 0.2 %   Neutrophils Relative % 74 %   Neutro Abs 5.6 1.7 - 7.7 K/uL   Lymphocytes Relative 14 %   Lymphs Abs 1.1  0.7 - 4.0 K/uL   Monocytes Relative 10 %   Monocytes Absolute 0.8 0.1 - 1.0 K/uL   Eosinophils Relative 1 %   Eosinophils Absolute 0.1 0.0 - 0.5 K/uL   Basophils Relative 0 %   Basophils Absolute 0.0 0.0 - 0.1 K/uL   Immature  Granulocytes 1 %   Abs Immature Granulocytes 0.09 (H) 0.00 - 0.07 K/uL  Magnesium     Status: None   Collection Time: 12/19/22  3:43 AM  Result Value Ref Range   Magnesium 2.1 1.7 - 2.4 mg/dL    I have reviewed pertinent nursing notes, vitals, labs, and images as necessary. I have ordered labwork to follow up on as indicated.  I have reviewed the last notes from staff over past 24 hours. I have discussed patient's care plan and test results with nursing staff, CM/SW, and other staff as appropriate.  Time spent: Greater than 50% of the 55 minute visit was spent in counseling/coordination of care for the patient as laid out in the A&P.   LOS: 3 days   Dwyane Dee, MD [facility] 12/19/2022, 2:32 PM

## 2022-12-20 DIAGNOSIS — S72001A Fracture of unspecified part of neck of right femur, initial encounter for closed fracture: Secondary | ICD-10-CM | POA: Diagnosis not present

## 2022-12-20 LAB — CBC WITH DIFFERENTIAL/PLATELET
Abs Immature Granulocytes: 0.14 10*3/uL — ABNORMAL HIGH (ref 0.00–0.07)
Basophils Absolute: 0 10*3/uL (ref 0.0–0.1)
Basophils Relative: 1 %
Eosinophils Absolute: 0.1 10*3/uL (ref 0.0–0.5)
Eosinophils Relative: 2 %
HCT: 26.8 % — ABNORMAL LOW (ref 39.0–52.0)
Hemoglobin: 8.6 g/dL — ABNORMAL LOW (ref 13.0–17.0)
Immature Granulocytes: 2 %
Lymphocytes Relative: 19 %
Lymphs Abs: 1.3 10*3/uL (ref 0.7–4.0)
MCH: 32.3 pg (ref 26.0–34.0)
MCHC: 32.1 g/dL (ref 30.0–36.0)
MCV: 100.8 fL — ABNORMAL HIGH (ref 80.0–100.0)
Monocytes Absolute: 0.8 10*3/uL (ref 0.1–1.0)
Monocytes Relative: 12 %
Neutro Abs: 4.6 10*3/uL (ref 1.7–7.7)
Neutrophils Relative %: 64 %
Platelets: 313 10*3/uL (ref 150–400)
RBC: 2.66 MIL/uL — ABNORMAL LOW (ref 4.22–5.81)
RDW: 14.7 % (ref 11.5–15.5)
WBC: 7 10*3/uL (ref 4.0–10.5)
nRBC: 0 % (ref 0.0–0.2)

## 2022-12-20 LAB — MAGNESIUM: Magnesium: 1.7 mg/dL (ref 1.7–2.4)

## 2022-12-20 LAB — BASIC METABOLIC PANEL
Anion gap: 7 (ref 5–15)
BUN: 28 mg/dL — ABNORMAL HIGH (ref 8–23)
CO2: 22 mmol/L (ref 22–32)
Calcium: 7.9 mg/dL — ABNORMAL LOW (ref 8.9–10.3)
Chloride: 101 mmol/L (ref 98–111)
Creatinine, Ser: 1.46 mg/dL — ABNORMAL HIGH (ref 0.61–1.24)
GFR, Estimated: 50 mL/min — ABNORMAL LOW (ref >60)
Glucose, Bld: 94 mg/dL (ref 70–99)
Potassium: 3.9 mmol/L (ref 3.5–5.1)
Sodium: 130 mmol/L — ABNORMAL LOW (ref 135–145)

## 2022-12-20 MED ORDER — OXYCODONE HCL 5 MG PO TABS: 5.0000 mg | ORAL_TABLET | ORAL | 0 refills | Status: DC | PRN

## 2022-12-20 NOTE — Progress Notes (Signed)
Discharge summary packet provided to pt with instructions. Pt verbalized understanding of instructions. Pt has no complaints. Pt d/c to home with [facility] services as ordered. Pt remains alert/oriented in no apparent distress. Pt's family is responsible for pt's transport.

## 2022-12-20 NOTE — Progress Notes (Signed)
Pt's valuables envelope opened in front of pt, and verified all the contents,and signed by pt. No complaints voiced.

## 2022-12-20 NOTE — Progress Notes (Signed)
PT Cancellation Note  Patient Details Name: Chad Winters MRN: [id number] DOB: [birth year]   Cancelled Treatment:    Reason Eval/Treat Not Completed: Patient declined, reporting he wanted to save his energy for the trip home.  He did stairs two days ago and worked with PTA yesterday.  RN made aware of pt's wishes.  Thanks,  Verdene Lennert, PT, DPT  Acute Rehabilitation Secure chat is best for contact #[phone] office      [name] 12/20/2022, 2:15 PM

## 2022-12-20 NOTE — Discharge Summary (Signed)
Physician Discharge Summary   Chad Winters FUX:[id number] DOB: 09/16/1946 DOA: 12/16/2022  PCP: Pcp, No  Admit date: 12/16/2022 Discharge date: 12/20/2022  Barriers to discharge: none  Admitted From: Home Disposition:  Home Discharging physician: Dwyane Dee, MD  Recommendations for Outpatient Follow-up:  Follow up with ortho  Home Health: PT Equipment/Devices: RW  Discharge Condition: stable CODE STATUS: Full Diet recommendation:  Diet Orders (From admission, onward)     Start     Ordered   12/20/22 0000  Diet general        12/20/22 1018   12/16/22 2311  Diet regular Room service appropriate? Yes; Fluid consistency: Thin  Diet effective now       Question Answer Comment  Room service appropriate? 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  He presented after a mechanical fall at home. Imaging revealed a mildly displaced, impacted transcervical fracture right femoral neck.  Orthopedic surgery was consulted and patient was admitted for surgical repair.  He underwent right total hip replacement on 12/16/2022.  Assessment and Plan: * Closed displaced fracture of right femoral neck (HCC) - s/p mechanical fall at home - s/p right total hip replacement on 12/16/2022 -Follow-up PT eval; home with HH - Continue aspirin -Worked well with physical therapy and discharged home with home health  HLD (hyperlipidemia) - Continue Lipitor  HTN (hypertension) - Continue amlodipine  History of migraine headaches - Follows with neurology - Continue  Topamax  Ankylosing spondylitis ([place]) - Follows with rheumatology outpatient - Continue Humira  Hypokalemia - Repleted       The patient's chronic medical conditions were treated accordingly per the patient's home medication regimen except as noted.  On day of discharge, patient was felt deemed stable for discharge. Patient/family member advised to call PCP or come back to ER if needed.   Principal Diagnosis: Closed displaced fracture of right femoral neck [facility])  Discharge Diagnoses: Active Hospital Problems   Diagnosis Date Noted   Closed displaced fracture of right femoral neck ([place]) 12/16/2022    Priority: 1.   History of migraine headaches 12/17/2022   HTN (hypertension) 12/17/2022   HLD (hyperlipidemia) 12/17/2022   Hypokalemia 12/16/2022   Ankylosing spondylitis ([place]) 12/16/2022    Resolved Hospital Problems  No resolved problems to display.     Discharge Instructions     Diet general   Complete by: As directed    Increase activity slowly   Complete by: As directed       Allergies as of 12/20/2022       Reactions   Ciprofloxacin Itching, Rash, Hives   Codeine Nausea And Vomiting   Stiffness and nausea   Lisinopril    Other reaction(s): Cough (ALLERGY/intolerance)        Medication List     TAKE these medications    acetaminophen 500 MG tablet Commonly known as: TYLENOL Take 500-1,000 mg by mouth every 4 (four) hours as needed for mild pain or moderate pain.   allopurinol 100 MG tablet Commonly known as: ZYLOPRIM Take 100 mg by mouth daily.  amLODipine 5 MG tablet Commonly known as: NORVASC Take 5 mg by mouth daily.   atorvastatin 40 MG tablet Commonly known as: LIPITOR Take 40 mg by mouth daily.   Cholecalciferol 50 MCG (2000 UT) Tabs Take 2,000 Units by mouth daily.   cyanocobalamin 1000 MCG tablet Commonly known as: VITAMIN B12 Take 1,000 mcg by mouth daily.   DULoxetine 20 MG capsule Commonly known as: CYMBALTA Take 40 mg by  mouth daily.   finasteride 5 MG tablet Commonly known as: PROSCAR Take 5 mg by mouth daily.   Humira Pen 40 MG/0.4ML Pnkt Generic drug: Adalimumab Inject 40 mg into the skin once a week. Mondays   loperamide 2 MG capsule Commonly known as: IMODIUM Take 2 mg by mouth every 4 (four) hours as needed for diarrhea or loose stools.   omeprazole 20 MG capsule Commonly known as: PRILOSEC Take 20 mg by mouth daily.   oxyCODONE 5 MG immediate release tablet Commonly known as: Oxy IR/ROXICODONE Take 1-2 tablets (5-10 mg total) by mouth every 4 (four) hours as needed for moderate pain.   potassium chloride SA 20 MEQ tablet Commonly known as: KLOR-CON M Take 40 mEq by mouth 2 (two) times daily.   topiramate 50 MG tablet Commonly known as: TOPAMAX Take 50 mg by mouth 2 (two) times daily.               Durable Medical Equipment  (From admission, onward)           Start     Ordered   12/17/22 1850  For home use only DME Walker rolling  Once       Question Answer Comment  Walker: With 5 Inch Wheels   Patient needs a walker to treat with the following condition Generalized muscle weakness      12/17/22 1849            Follow-up Information     Care, [facility] Follow up.   Specialty: Home Health Services Why: Someone from [facility] will contact youi to arrange start date and time for your therapy. Contact information: [place] [address] [phone]         Meredith Pel, MD. Schedule an appointment as soon as possible for a visit.   Specialty: Orthopedic Surgery Why: Call office to make appointment for 2 weeks after your hip surgery.  Call us in the meantime if you have any concerns about hip pain or any changes in how you are doing postoperatively. Contact information: [place] [address] [phone]                Allergies  Allergen Reactions   Ciprofloxacin Itching, Rash  and Hives   Codeine Nausea And Vomiting    Stiffness and nausea    Lisinopril     Other reaction(s): Cough (ALLERGY/intolerance)    Consultations: Orthopedic surgery  Procedures: 12/16/2022: Right total hip replacement   Discharge Exam: BP 119/64 (BP Location: Left Arm)   Pulse (!) 57   Temp 98.9 F (37.2 C) (Oral)   Resp 18   Ht '5\' 8"'$  (1.727 m)   Wt 62 kg   SpO2 95%   BMI 20.78 kg/m  Physical Exam Constitutional:      General: He is not in acute distress.    Appearance: Normal appearance.     Comments: Hard of hearing in general  HENT:     Head: Normocephalic and atraumatic.  Mouth/Throat:     Mouth: Mucous membranes are moist.  Eyes:     Extraocular Movements: Extraocular movements intact.  Cardiovascular:     Rate and Rhythm: Normal rate and regular rhythm.     Heart sounds: Normal heart sounds.  Pulmonary:     Effort: Pulmonary effort is normal. No respiratory distress.     Breath sounds: Normal breath sounds. No wheezing.  Abdominal:     General: Bowel sounds are normal. There is no distension.     Palpations: Abdomen is soft.     Tenderness: There is no abdominal tenderness.  Musculoskeletal:        General: Normal range of motion.     Cervical back: Normal range of motion and neck supple.     Comments: Surgical dressing in place on right lateral thigh, compartments soft  Skin:    General: Skin is warm and dry.  Neurological:     General: No focal deficit present.     Mental Status: He is alert.  Psychiatric:        Mood and Affect: Mood normal.        Behavior: Behavior normal.      The results of significant diagnostics from this hospitalization (including imaging, microbiology, ancillary and laboratory) are listed below for reference.   Microbiology: Recent Results (from the past 240 hour(s))  Surgical pcr screen     Status: None   Collection Time: 12/16/22  5:35 PM   Specimen: Nasal Mucosa; Nasal Swab  Result Value Ref Range Status    MRSA, PCR NEGATIVE NEGATIVE Final   Staphylococcus aureus NEGATIVE NEGATIVE Final    Comment: (NOTE) The Xpert SA Assay (FDA approved for NASAL specimens in patients 75 years of age and older), is one component of a comprehensive surveillance program. It is not intended to diagnose infection nor to guide or monitor treatment. Performed at [facility], [place] [address] [place], [place] [address]      Labs: BNP (last 3 results) No results for input(s): "BNP" in the last 8760 hours. Basic Metabolic Panel: Recent Labs  Lab 12/16/22 1505 12/16/22 1615 12/16/22 1914 12/17/22 0347 12/18/22 0514 12/19/22 0343 12/20/22 0258  NA 136  --  139 130* 133* 134* 130*  K 2.8*  --  3.3* 3.5 3.4* 4.4 3.9  CL 103  --  103 103 103 105 101  CO2 23  --   --  21* '22 24 22  '$ GLUCOSE 110*  --  98 168* 81 107* 94  BUN 19  --  16 16 28* 29* 28*  CREATININE 1.17  --  1.00 1.17 1.91* 1.63* 1.46*  CALCIUM 8.3*  --   --  7.3* 7.5* 7.7* 7.9*  MG  --  1.7  --  1.5* 1.4* 2.1 1.7   Liver Function Tests: No results for input(s): "AST", "ALT", "ALKPHOS", "BILITOT", "PROT", "ALBUMIN" in the last 168 hours. No results for input(s): "LIPASE", "AMYLASE" in the last 168 hours. No results for input(s): "AMMONIA" in the last 168 hours. CBC: Recent Labs  Lab 12/16/22 1505 12/16/22 1914 12/17/22 0347 12/18/22 0514 12/19/22 0343 12/20/22 0258  WBC 10.6*  --  11.2* 9.2 7.7 7.0  NEUTROABS 8.7*  --  10.0* 7.0 5.6 4.6  HGB 12.2* 12.6* 10.1* 8.9* 8.4* 8.6*  HCT 36.1* 37.0* 30.4* 26.7* 24.8* 26.8*  MCV 94.3  --  95.9 97.1 96.1 100.8*  PLT 341  --  292 291 277 313   Cardiac Enzymes: No  results for input(s): "CKTOTAL", "CKMB", "CKMBINDEX", "TROPONINI" in the last 168 hours. BNP: Invalid input(s): "POCBNP" CBG: No results for input(s): "GLUCAP" in the last 168 hours. D-Dimer No results for input(s): "DDIMER" in the last 72 hours. Hgb A1c No results for input(s): "HGBA1C" in the last 72 hours. Lipid  Profile No results for input(s): "CHOL", "HDL", "LDLCALC", "TRIG", "CHOLHDL", "LDLDIRECT" in the last 72 hours. Thyroid function studies No results for input(s): "TSH", "T4TOTAL", "T3FREE", "THYROIDAB" in the last 72 hours.  Invalid input(s): "FREET3" Anemia work up No results for input(s): "VITAMINB12", "FOLATE", "FERRITIN", "TIBC", "IRON", "RETICCTPCT" in the last 72 hours. Urinalysis No results found for: "COLORURINE", "APPEARANCEUR", "LABSPEC", "PHURINE", "GLUCOSEU", "HGBUR", "BILIRUBINUR", "KETONESUR", "PROTEINUR", "UROBILINOGEN", "NITRITE", "LEUKOCYTESUR" Sepsis Labs Recent Labs  Lab 12/17/22 0347 12/18/22 0514 12/19/22 0343 12/20/22 0258  WBC 11.2* 9.2 7.7 7.0   Microbiology Recent Results (from the past 240 hour(s))  Surgical pcr screen     Status: None   Collection Time: 12/16/22  5:35 PM   Specimen: Nasal Mucosa; Nasal Swab  Result Value Ref Range Status   MRSA, PCR NEGATIVE NEGATIVE Final   Staphylococcus aureus NEGATIVE NEGATIVE Final    Comment: (NOTE) The Xpert SA Assay (FDA approved for NASAL specimens in patients 58 years of age and older), is one component of a comprehensive surveillance program. It is not intended to diagnose infection nor to guide or monitor treatment. Performed at [facility], [place] [address], [place], [place] [address]     Procedures/Studies: Pelvis Portable  Result Date: 12/16/2022 CLINICAL DATA:  Status post right hip replacement. EXAM: PORTABLE PELVIS 1-2 VIEWS COMPARISON:  02/05/2022. FINDINGS: Total hip arthroplasty changes are noted bilaterally. Alignment appears anatomic. No acute fracture or dislocation. Air is noted in the soft tissues lateral to the right hip. Extensive vascular calcifications in the pelvis and lower extremities bilaterally. IMPRESSION: Status post bilateral total hip arthroplasty. Electronically Signed   By: Brett Fairy M.D.   On: 12/16/2022 22:49   DG HIP UNILAT WITH PELVIS 2-3 VIEWS RIGHT  Result  Date: 12/16/2022 CLINICAL DATA:  Right hip arthroplasty status post femoral neck fracture. EXAM: DG HIP (WITH OR WITHOUT PELVIS) 2-3V RIGHT COMPARISON:  Earlier today FINDINGS: Seven images from portable C-arm radiography in the operating room show interval right total hip arthroplasty. Hardware components are in anatomic alignment. Signs of previous left hip arthroplasty are again seen. IMPRESSION: Status post right total hip arthroplasty. Electronically Signed   By: Kerby Moors M.D.   On: 12/16/2022 21:38   DG C-Arm 1-60 Min-No Report  Result Date: 12/16/2022 Fluoroscopy was utilized by the requesting physician.  No radiographic interpretation.   DG C-Arm 1-60 Min-No Report  Result Date: 12/16/2022 Fluoroscopy was utilized by the requesting physician.  No radiographic interpretation.   DG C-Arm 1-60 Min-No Report  Result Date: 12/16/2022 Fluoroscopy was utilized by the requesting physician.  No radiographic interpretation.   DG Chest Portable 1 View  Result Date: 12/16/2022 CLINICAL DATA:  Preop evaluation for hip surgery EXAM: PORTABLE CHEST 1 VIEW COMPARISON:  CT done on 09/21/2022 FINDINGS: Transverse diameter of heart is in the upper limits of normal. There are no signs of pulmonary edema or focal pulmonary consolidation. There is no pleural effusion or pneumothorax. Deformities in the lateral aspect of right sixth and seventh ribs suggest old healed fractures. IMPRESSION: No active disease. Electronically Signed   By: Elmer Picker M.D.   On: 12/16/2022 15:31   DG Hip Unilat W or Wo Pelvis  2-3 Views Right  Result Date: 12/16/2022 CLINICAL DATA:  Fall this morning, right hip pain EXAM: DG HIP (WITH OR WITHOUT PELVIS) 2-3V RIGHT; RIGHT FEMUR 2 VIEWS COMPARISON:  None Available. FINDINGS: Mildly displaced, impacted transcervical fracture right femoral neck. The distal femur is intact. No displaced fracture or dislocation of the pelvis or proximal left femur seen in single frontal view.  Post left hip total arthroplasty. No perihardware fracture or loosening. Vascular calcinosis. IMPRESSION: 1. Mildly displaced, impacted transcervical fracture right femoral neck. The distal right femur is intact. 2. No displaced fracture or dislocation of the pelvis or proximal left femur seen in single frontal view. 3. Status post left hip total arthroplasty. Electronically Signed   By: Delanna Ahmadi M.D.   On: 12/16/2022 14:33   DG Femur Min 2 Views Right  Result Date: 12/16/2022 CLINICAL DATA:  Fall this morning, right hip pain EXAM: DG HIP (WITH OR WITHOUT PELVIS) 2-3V RIGHT; RIGHT FEMUR 2 VIEWS COMPARISON:  None Available. FINDINGS: Mildly displaced, impacted transcervical fracture right femoral neck. The distal femur is intact. No displaced fracture or dislocation of the pelvis or proximal left femur seen in single frontal view. Post left hip total arthroplasty. No perihardware fracture or loosening. Vascular calcinosis. IMPRESSION: 1. Mildly displaced, impacted transcervical fracture right femoral neck. The distal right femur is intact. 2. No displaced fracture or dislocation of the pelvis or proximal left femur seen in single frontal view. 3. Status post left hip total arthroplasty. Electronically Signed   By: Delanna Ahmadi M.D.   On: 12/16/2022 14:33     Time coordinating discharge: Over 30 minutes    Dwyane Dee, MD  [facility] 12/20/2022, 4:33 PM

## 2022-12-20 NOTE — TOC Transition Note (Signed)
Transition of Care [facility]) - CM/SW Discharge Note   Patient Details  Name: Chad Winters MRN: [id number] Date of Birth: Apr 05, 1946  [facility] CM/SW Contact:  Bartholomew Crews, RN Phone Number: [phone] 12/20/2022, 12:24 PM   Clinical Narrative:     Patient to transition home today. Previous RN CM arranged for [facility] PT with [place] - notified liaison of transition home today. Referral to [employer] for RW - verified delivery occurred on Friday 1/5. No further TOC needs identified at this time.   Final next level of care: [place] Barriers to Discharge: No Barriers Identified   Patient Goals and CMS Choice   Choice offered to / list presented to : Patient  Discharge Placement                         Discharge Plan and Services Additional resources added to the After Visit Summary for     Discharge Planning Services: CM Consult Post Acute Care Choice: Durable Medical Equipment          DME Arranged: Gilford Rile rolling DME Agency: [employer] Date DME Agency Contacted: 12/20/22 Time DME Agency Contacted: 1223 Representative spoke with at DME Agency: [place]: PT, OT [place] Agency: [place] Date [place]: 12/20/22 Time [place]: 1223 Representative spoke with at [place]: [place] Determinants of Health ([place]) Interventions [place]: No Food Insecurity (12/17/2022)  Housing: Low Risk  (12/17/2022)  Transportation Needs: No Transportation Needs (12/17/2022)  Utilities: Not At Risk (12/17/2022)  Tobacco Use: High Risk (12/18/2022)     Readmission Risk Interventions     No data to display

## 2022-12-24 ENCOUNTER — Telehealth: Payer: Self-pay | Admitting: Orthopedic Surgery

## 2022-12-24 DIAGNOSIS — M109 Gout, unspecified: Secondary | ICD-10-CM | POA: Diagnosis not present

## 2022-12-24 DIAGNOSIS — I129 Hypertensive chronic kidney disease with stage 1 through stage 4 chronic kidney disease, or unspecified chronic kidney disease: Secondary | ICD-10-CM | POA: Diagnosis not present

## 2022-12-24 DIAGNOSIS — M79604 Pain in right leg: Secondary | ICD-10-CM

## 2022-12-24 DIAGNOSIS — N1832 Chronic kidney disease, stage 3b: Secondary | ICD-10-CM | POA: Diagnosis not present

## 2022-12-24 DIAGNOSIS — G43909 Migraine, unspecified, not intractable, without status migrainosus: Secondary | ICD-10-CM | POA: Diagnosis not present

## 2022-12-24 DIAGNOSIS — S72031D Displaced midcervical fracture of right femur, subsequent encounter for closed fracture with routine healing: Secondary | ICD-10-CM | POA: Diagnosis not present

## 2022-12-24 DIAGNOSIS — M459 Ankylosing spondylitis of unspecified sites in spine: Secondary | ICD-10-CM | POA: Diagnosis not present

## 2022-12-24 DIAGNOSIS — G2119 Other drug induced secondary parkinsonism: Secondary | ICD-10-CM | POA: Diagnosis not present

## 2022-12-24 DIAGNOSIS — G4730 Sleep apnea, unspecified: Secondary | ICD-10-CM | POA: Diagnosis not present

## 2022-12-24 DIAGNOSIS — M79605 Pain in left leg: Secondary | ICD-10-CM

## 2022-12-24 DIAGNOSIS — Z96641 Presence of right artificial hip joint: Secondary | ICD-10-CM | POA: Diagnosis not present

## 2022-12-24 NOTE — Telephone Encounter (Signed)
Chad Winters from [facility] called eval performed concerns are lower extremity +3 right foot and  left +1. Patient states he does not have compression hose and not on any blood thinners. Other concerns we need to confirm weight baring status and hip precautions order for PT 1/week-1  2/week-4 1/week-4 currently inconsistent but is getting better.     [phone]

## 2022-12-24 NOTE — Telephone Encounter (Signed)
Okay for 1 baby aspirin twice a day and ultrasound bilateral lower extremities to rule out DVT.  He can also use calf high compression hose.  Thanks weightbearing as tolerated.  Anterior hip precautions.  Thanks

## 2022-12-24 NOTE — Telephone Encounter (Signed)
Holding for Lauren to follow up with patient.

## 2022-12-24 NOTE — Telephone Encounter (Signed)
Lauren- Order put in for bilateral lower extremity dopplers. Abigail Butts to work on trying to get scheduled for tomorrow.   I left voicemail for Clair Gulling advising of Dr. Randel Pigg message below.  I called daughter to advise, no answer.  I called patient to advise, no answer.

## 2022-12-24 NOTE — Addendum Note (Signed)
Addended by: Meyer Cory on: 12/24/2022 04:59 PM   Modules accepted: Orders

## 2022-12-25 ENCOUNTER — Telehealth: Payer: Self-pay | Admitting: Orthopedic Surgery

## 2022-12-25 NOTE — Telephone Encounter (Signed)
Chad Winters called stating Chad Winters called him and he was returning her call. Chad Winters (PT) from [facility] called stating he needs to clarify element for hip precautions. Please call Chad Winters at [phone].

## 2022-12-25 NOTE — Telephone Encounter (Signed)
Verbal orders given  

## 2022-12-28 ENCOUNTER — Other Ambulatory Visit: Payer: Self-pay | Admitting: Surgical

## 2022-12-28 ENCOUNTER — Telehealth: Payer: Self-pay | Admitting: Orthopedic Surgery

## 2022-12-28 ENCOUNTER — Telehealth: Payer: Self-pay

## 2022-12-28 DIAGNOSIS — G43909 Migraine, unspecified, not intractable, without status migrainosus: Secondary | ICD-10-CM | POA: Diagnosis not present

## 2022-12-28 DIAGNOSIS — G2119 Other drug induced secondary parkinsonism: Secondary | ICD-10-CM | POA: Diagnosis not present

## 2022-12-28 DIAGNOSIS — Z96641 Presence of right artificial hip joint: Secondary | ICD-10-CM | POA: Diagnosis not present

## 2022-12-28 DIAGNOSIS — N1832 Chronic kidney disease, stage 3b: Secondary | ICD-10-CM | POA: Diagnosis not present

## 2022-12-28 DIAGNOSIS — G4730 Sleep apnea, unspecified: Secondary | ICD-10-CM | POA: Diagnosis not present

## 2022-12-28 DIAGNOSIS — M109 Gout, unspecified: Secondary | ICD-10-CM | POA: Diagnosis not present

## 2022-12-28 DIAGNOSIS — S72031D Displaced midcervical fracture of right femur, subsequent encounter for closed fracture with routine healing: Secondary | ICD-10-CM | POA: Diagnosis not present

## 2022-12-28 DIAGNOSIS — M459 Ankylosing spondylitis of unspecified sites in spine: Secondary | ICD-10-CM | POA: Diagnosis not present

## 2022-12-28 DIAGNOSIS — I129 Hypertensive chronic kidney disease with stage 1 through stage 4 chronic kidney disease, or unspecified chronic kidney disease: Secondary | ICD-10-CM | POA: Diagnosis not present

## 2022-12-28 MED ORDER — OXYCODONE HCL 5 MG PO TABS: 5.0000 mg | ORAL_TABLET | Freq: Four times a day (QID) | ORAL | 0 refills | Status: DC | PRN

## 2022-12-28 NOTE — Telephone Encounter (Signed)
Received call from Tommi Emery (OT) with [facility] needing clarification as to whether patient had Anterior or Posterior hip and what are the precautions?   The number to contact Tommi Emery is [phone]

## 2022-12-28 NOTE — Telephone Encounter (Signed)
Pt is s/p a right total hip on 13/2024 asking for refill of oxycodone 5 mg last refill was 12/20/2022 #30 please advise.

## 2022-12-28 NOTE — Telephone Encounter (Signed)
Pt called requesting a refill of oxycodone. Please send to pharmacy on file. Pt phone number is [phone].

## 2022-12-28 NOTE — Telephone Encounter (Signed)
12/16/2022 anterior total hip. Any precautions for HHOT? Please advise.

## 2022-12-28 NOTE — Telephone Encounter (Signed)
Can one of you please schedule doppler? Order placed on 01/11? Still needs to be STAT.

## 2022-12-28 NOTE — Telephone Encounter (Signed)
Sent in

## 2022-12-28 NOTE — Telephone Encounter (Signed)
FYI I tried to call pt and the man that answered states that thi sis not the pt's number and that our office must have a "mix up" as this is not the first time that this has happened. Tried to call daughter to confirm pt number and it went to vm. Pharmacy should notify of refill just wanted to make you aware.

## 2022-12-29 ENCOUNTER — Ambulatory Visit (HOSPITAL_COMMUNITY)
Admission: RE | Admit: 2022-12-29 | Discharge: 2022-12-29 | Disposition: A | Discharge status: Home / Self Care | Payer: Medicare HMO | Source: Ambulatory Visit | Attending: Orthopedic Surgery | Admitting: Orthopedic Surgery

## 2022-12-29 DIAGNOSIS — M79605 Pain in left leg: Secondary | ICD-10-CM | POA: Diagnosis not present

## 2022-12-29 DIAGNOSIS — M7989 Other specified soft tissue disorders: Secondary | ICD-10-CM | POA: Diagnosis not present

## 2022-12-29 DIAGNOSIS — M79604 Pain in right leg: Secondary | ICD-10-CM

## 2022-12-29 NOTE — Telephone Encounter (Signed)
Pt is scheduled today 12/29/22 at [facility] for a 230pm appt today?

## 2022-12-29 NOTE — Telephone Encounter (Signed)
Sw pt daughter and she is aware of appt and states her husband will take him to have the US done

## 2022-12-30 ENCOUNTER — Telehealth: Payer: Self-pay | Admitting: *Deleted

## 2022-12-30 NOTE — Telephone Encounter (Signed)
Chad Winters with [facility] called needing VO for In Home OT for ADL transfers, pain control, health promotion in safety.   OT for 2 times a week for 1 week, skip a week then 1 time a week for week, skip a week and then 1 time a week for 1 week.   Please call [phone] Chad Winters  Also, he is asking about the last phone call he made couple days on 12/28/22 states no one has called him back on that message. Please advise

## 2022-12-30 NOTE — Telephone Encounter (Signed)
IC no answer. LMVM giving ok for orders

## 2022-12-31 DIAGNOSIS — I129 Hypertensive chronic kidney disease with stage 1 through stage 4 chronic kidney disease, or unspecified chronic kidney disease: Secondary | ICD-10-CM | POA: Diagnosis not present

## 2022-12-31 DIAGNOSIS — S72031D Displaced midcervical fracture of right femur, subsequent encounter for closed fracture with routine healing: Secondary | ICD-10-CM | POA: Diagnosis not present

## 2022-12-31 DIAGNOSIS — G4730 Sleep apnea, unspecified: Secondary | ICD-10-CM | POA: Diagnosis not present

## 2022-12-31 DIAGNOSIS — G2119 Other drug induced secondary parkinsonism: Secondary | ICD-10-CM | POA: Diagnosis not present

## 2022-12-31 DIAGNOSIS — N1832 Chronic kidney disease, stage 3b: Secondary | ICD-10-CM | POA: Diagnosis not present

## 2022-12-31 DIAGNOSIS — G43909 Migraine, unspecified, not intractable, without status migrainosus: Secondary | ICD-10-CM | POA: Diagnosis not present

## 2022-12-31 DIAGNOSIS — M109 Gout, unspecified: Secondary | ICD-10-CM | POA: Diagnosis not present

## 2022-12-31 DIAGNOSIS — M459 Ankylosing spondylitis of unspecified sites in spine: Secondary | ICD-10-CM | POA: Diagnosis not present

## 2022-12-31 DIAGNOSIS — Z96641 Presence of right artificial hip joint: Secondary | ICD-10-CM | POA: Diagnosis not present

## 2023-01-01 NOTE — Telephone Encounter (Signed)
Limited external rotation and hip extension

## 2023-01-01 NOTE — Telephone Encounter (Signed)
LMOM for Timmothy Sours of the below message

## 2023-01-05 DIAGNOSIS — M109 Gout, unspecified: Secondary | ICD-10-CM | POA: Diagnosis not present

## 2023-01-05 DIAGNOSIS — M459 Ankylosing spondylitis of unspecified sites in spine: Secondary | ICD-10-CM | POA: Diagnosis not present

## 2023-01-05 DIAGNOSIS — Z96641 Presence of right artificial hip joint: Secondary | ICD-10-CM | POA: Diagnosis not present

## 2023-01-05 DIAGNOSIS — N1832 Chronic kidney disease, stage 3b: Secondary | ICD-10-CM | POA: Diagnosis not present

## 2023-01-05 DIAGNOSIS — G2119 Other drug induced secondary parkinsonism: Secondary | ICD-10-CM | POA: Diagnosis not present

## 2023-01-05 DIAGNOSIS — I129 Hypertensive chronic kidney disease with stage 1 through stage 4 chronic kidney disease, or unspecified chronic kidney disease: Secondary | ICD-10-CM | POA: Diagnosis not present

## 2023-01-05 DIAGNOSIS — S72031D Displaced midcervical fracture of right femur, subsequent encounter for closed fracture with routine healing: Secondary | ICD-10-CM | POA: Diagnosis not present

## 2023-01-05 DIAGNOSIS — G4730 Sleep apnea, unspecified: Secondary | ICD-10-CM | POA: Diagnosis not present

## 2023-01-05 DIAGNOSIS — G43909 Migraine, unspecified, not intractable, without status migrainosus: Secondary | ICD-10-CM | POA: Diagnosis not present

## 2023-01-06 ENCOUNTER — Encounter: Payer: Self-pay | Admitting: Orthopedic Surgery

## 2023-01-06 ENCOUNTER — Ambulatory Visit (INDEPENDENT_AMBULATORY_CARE_PROVIDER_SITE_OTHER): Payer: Medicare HMO | Admitting: Orthopedic Surgery

## 2023-01-06 ENCOUNTER — Ambulatory Visit (INDEPENDENT_AMBULATORY_CARE_PROVIDER_SITE_OTHER): Payer: Medicare HMO

## 2023-01-06 DIAGNOSIS — Z96641 Presence of right artificial hip joint: Secondary | ICD-10-CM

## 2023-01-06 MED ORDER — OXYCODONE HCL 5 MG PO TABS: 5.0000 mg | ORAL_TABLET | Freq: Three times a day (TID) | ORAL | 0 refills | Status: DC | PRN

## 2023-01-06 NOTE — Progress Notes (Signed)
   Post-Op Visit Note   Patient: Chad Winters           Date of Birth: [birth year]           MRN: [id number] Visit Date: 01/06/2023 PCP: Pcp, No   Assessment & Plan:  Chief Complaint:  Chief Complaint  Patient presents with   Right Hip - Routine Post Op    12/16/22 right THA for femoral neck fx   Visit Diagnoses:  1. Status post total replacement of right hip     Plan: Marteze is a 77 year old patient underwent right total hip replacement for femoral neck fracture 12/16/2022.  Has occasional pain and swelling.  Occasional twinges.  Taking Tylenol for symptoms.  On examination he has equal leg lengths.  Incision intact.  Good range of motion with no significant groin pain on the right with internal and external rotation of the leg.  No calf tenderness negative [place].  Radiographs look good.  Plan at this time is continue with home health physical therapy.  Patient remains homebound.  Oxycodone prescribed x 1 more prescription only.  Follow-up in 6 weeks for final clinical check.  Follow-Up Instructions: No follow-ups on file.   Orders:  Orders Placed This Encounter  Procedures   XR HIP UNILAT W OR W/O PELVIS 2-3 VIEWS RIGHT   Meds ordered this encounter  Medications   oxyCODONE (OXY IR/ROXICODONE) 5 MG immediate release tablet    Sig: Take 1 tablet (5 mg total) by mouth every 8 (eight) hours as needed for moderate pain.    Dispense:  24 tablet    Refill:  0    Imaging: No results found.  PMFS History: Patient Active Problem List   Diagnosis Date Noted   History of migraine headaches 12/17/2022   HTN (hypertension) 12/17/2022   HLD (hyperlipidemia) 12/17/2022   Hip fracture ([place]) 12/16/2022   Closed displaced fracture of right femoral neck ([place]) 12/16/2022   Hypokalemia 12/16/2022   Ankylosing spondylitis ([place]) 12/16/2022   Immunosuppressed status ([place]) 12/16/2022   Past Medical History:  Diagnosis Date   Cancer ([place])     No family history on file.  Past  Surgical History:  Procedure Laterality Date   BRAIN SURGERY     TOTAL HIP ARTHROPLASTY Left    TOTAL HIP ARTHROPLASTY Right 12/16/2022   Procedure: RIGHT TOTAL HIP ARTHROPLASTY ANTERIOR APPROACH;  Surgeon: Meredith Pel, MD;  Location: [place];  Service: Orthopedics;  Laterality: Right;   Social History   Occupational History   Not on file  Tobacco Use   Smoking status: Every Day    Types: Cigars   Smokeless tobacco: Never  Substance and Sexual Activity   Alcohol use: Yes    Comment: Occasionally   Drug use: Never   Sexual activity: Not on file

## 2023-01-07 DIAGNOSIS — M459 Ankylosing spondylitis of unspecified sites in spine: Secondary | ICD-10-CM | POA: Diagnosis not present

## 2023-01-07 DIAGNOSIS — S72031D Displaced midcervical fracture of right femur, subsequent encounter for closed fracture with routine healing: Secondary | ICD-10-CM | POA: Diagnosis not present

## 2023-01-07 DIAGNOSIS — M109 Gout, unspecified: Secondary | ICD-10-CM | POA: Diagnosis not present

## 2023-01-07 DIAGNOSIS — G43909 Migraine, unspecified, not intractable, without status migrainosus: Secondary | ICD-10-CM | POA: Diagnosis not present

## 2023-01-07 DIAGNOSIS — I129 Hypertensive chronic kidney disease with stage 1 through stage 4 chronic kidney disease, or unspecified chronic kidney disease: Secondary | ICD-10-CM | POA: Diagnosis not present

## 2023-01-07 DIAGNOSIS — G2119 Other drug induced secondary parkinsonism: Secondary | ICD-10-CM | POA: Diagnosis not present

## 2023-01-07 DIAGNOSIS — G4730 Sleep apnea, unspecified: Secondary | ICD-10-CM | POA: Diagnosis not present

## 2023-01-07 DIAGNOSIS — N1832 Chronic kidney disease, stage 3b: Secondary | ICD-10-CM | POA: Diagnosis not present

## 2023-01-07 DIAGNOSIS — Z96641 Presence of right artificial hip joint: Secondary | ICD-10-CM | POA: Diagnosis not present

## 2023-01-11 DIAGNOSIS — S72031D Displaced midcervical fracture of right femur, subsequent encounter for closed fracture with routine healing: Secondary | ICD-10-CM | POA: Diagnosis not present

## 2023-01-11 DIAGNOSIS — G43909 Migraine, unspecified, not intractable, without status migrainosus: Secondary | ICD-10-CM | POA: Diagnosis not present

## 2023-01-11 DIAGNOSIS — M459 Ankylosing spondylitis of unspecified sites in spine: Secondary | ICD-10-CM | POA: Diagnosis not present

## 2023-01-11 DIAGNOSIS — G4730 Sleep apnea, unspecified: Secondary | ICD-10-CM | POA: Diagnosis not present

## 2023-01-11 DIAGNOSIS — M109 Gout, unspecified: Secondary | ICD-10-CM | POA: Diagnosis not present

## 2023-01-11 DIAGNOSIS — I129 Hypertensive chronic kidney disease with stage 1 through stage 4 chronic kidney disease, or unspecified chronic kidney disease: Secondary | ICD-10-CM | POA: Diagnosis not present

## 2023-01-11 DIAGNOSIS — N1832 Chronic kidney disease, stage 3b: Secondary | ICD-10-CM | POA: Diagnosis not present

## 2023-01-11 DIAGNOSIS — Z96641 Presence of right artificial hip joint: Secondary | ICD-10-CM | POA: Diagnosis not present

## 2023-01-11 DIAGNOSIS — G2119 Other drug induced secondary parkinsonism: Secondary | ICD-10-CM | POA: Diagnosis not present

## 2023-01-12 DIAGNOSIS — Z96641 Presence of right artificial hip joint: Secondary | ICD-10-CM | POA: Diagnosis not present

## 2023-01-12 DIAGNOSIS — S72031D Displaced midcervical fracture of right femur, subsequent encounter for closed fracture with routine healing: Secondary | ICD-10-CM | POA: Diagnosis not present

## 2023-01-12 DIAGNOSIS — I129 Hypertensive chronic kidney disease with stage 1 through stage 4 chronic kidney disease, or unspecified chronic kidney disease: Secondary | ICD-10-CM | POA: Diagnosis not present

## 2023-01-12 DIAGNOSIS — N1832 Chronic kidney disease, stage 3b: Secondary | ICD-10-CM | POA: Diagnosis not present

## 2023-01-12 DIAGNOSIS — M109 Gout, unspecified: Secondary | ICD-10-CM | POA: Diagnosis not present

## 2023-01-12 DIAGNOSIS — G4730 Sleep apnea, unspecified: Secondary | ICD-10-CM | POA: Diagnosis not present

## 2023-01-12 DIAGNOSIS — G43909 Migraine, unspecified, not intractable, without status migrainosus: Secondary | ICD-10-CM | POA: Diagnosis not present

## 2023-01-12 DIAGNOSIS — M459 Ankylosing spondylitis of unspecified sites in spine: Secondary | ICD-10-CM | POA: Diagnosis not present

## 2023-01-12 DIAGNOSIS — G2119 Other drug induced secondary parkinsonism: Secondary | ICD-10-CM | POA: Diagnosis not present

## 2023-01-15 DIAGNOSIS — I129 Hypertensive chronic kidney disease with stage 1 through stage 4 chronic kidney disease, or unspecified chronic kidney disease: Secondary | ICD-10-CM | POA: Diagnosis not present

## 2023-01-15 DIAGNOSIS — G2119 Other drug induced secondary parkinsonism: Secondary | ICD-10-CM | POA: Diagnosis not present

## 2023-01-15 DIAGNOSIS — G4730 Sleep apnea, unspecified: Secondary | ICD-10-CM | POA: Diagnosis not present

## 2023-01-15 DIAGNOSIS — S72031D Displaced midcervical fracture of right femur, subsequent encounter for closed fracture with routine healing: Secondary | ICD-10-CM | POA: Diagnosis not present

## 2023-01-15 DIAGNOSIS — M459 Ankylosing spondylitis of unspecified sites in spine: Secondary | ICD-10-CM | POA: Diagnosis not present

## 2023-01-15 DIAGNOSIS — Z96641 Presence of right artificial hip joint: Secondary | ICD-10-CM | POA: Diagnosis not present

## 2023-01-15 DIAGNOSIS — M109 Gout, unspecified: Secondary | ICD-10-CM | POA: Diagnosis not present

## 2023-01-15 DIAGNOSIS — G43909 Migraine, unspecified, not intractable, without status migrainosus: Secondary | ICD-10-CM | POA: Diagnosis not present

## 2023-01-15 DIAGNOSIS — N1832 Chronic kidney disease, stage 3b: Secondary | ICD-10-CM | POA: Diagnosis not present

## 2023-01-18 DIAGNOSIS — M459 Ankylosing spondylitis of unspecified sites in spine: Secondary | ICD-10-CM | POA: Diagnosis not present

## 2023-01-18 DIAGNOSIS — G43909 Migraine, unspecified, not intractable, without status migrainosus: Secondary | ICD-10-CM | POA: Diagnosis not present

## 2023-01-18 DIAGNOSIS — I129 Hypertensive chronic kidney disease with stage 1 through stage 4 chronic kidney disease, or unspecified chronic kidney disease: Secondary | ICD-10-CM | POA: Diagnosis not present

## 2023-01-18 DIAGNOSIS — Z96641 Presence of right artificial hip joint: Secondary | ICD-10-CM | POA: Diagnosis not present

## 2023-01-18 DIAGNOSIS — G4730 Sleep apnea, unspecified: Secondary | ICD-10-CM | POA: Diagnosis not present

## 2023-01-18 DIAGNOSIS — G2119 Other drug induced secondary parkinsonism: Secondary | ICD-10-CM | POA: Diagnosis not present

## 2023-01-18 DIAGNOSIS — S72031D Displaced midcervical fracture of right femur, subsequent encounter for closed fracture with routine healing: Secondary | ICD-10-CM | POA: Diagnosis not present

## 2023-01-18 DIAGNOSIS — M109 Gout, unspecified: Secondary | ICD-10-CM | POA: Diagnosis not present

## 2023-01-18 DIAGNOSIS — N1832 Chronic kidney disease, stage 3b: Secondary | ICD-10-CM | POA: Diagnosis not present

## 2023-01-20 DIAGNOSIS — M109 Gout, unspecified: Secondary | ICD-10-CM | POA: Diagnosis not present

## 2023-01-20 DIAGNOSIS — G2119 Other drug induced secondary parkinsonism: Secondary | ICD-10-CM | POA: Diagnosis not present

## 2023-01-20 DIAGNOSIS — N1832 Chronic kidney disease, stage 3b: Secondary | ICD-10-CM | POA: Diagnosis not present

## 2023-01-20 DIAGNOSIS — M459 Ankylosing spondylitis of unspecified sites in spine: Secondary | ICD-10-CM | POA: Diagnosis not present

## 2023-01-20 DIAGNOSIS — S72031D Displaced midcervical fracture of right femur, subsequent encounter for closed fracture with routine healing: Secondary | ICD-10-CM | POA: Diagnosis not present

## 2023-01-20 DIAGNOSIS — G4730 Sleep apnea, unspecified: Secondary | ICD-10-CM | POA: Diagnosis not present

## 2023-01-20 DIAGNOSIS — I129 Hypertensive chronic kidney disease with stage 1 through stage 4 chronic kidney disease, or unspecified chronic kidney disease: Secondary | ICD-10-CM | POA: Diagnosis not present

## 2023-01-20 DIAGNOSIS — G43909 Migraine, unspecified, not intractable, without status migrainosus: Secondary | ICD-10-CM | POA: Diagnosis not present

## 2023-01-20 DIAGNOSIS — Z96641 Presence of right artificial hip joint: Secondary | ICD-10-CM | POA: Diagnosis not present

## 2023-01-25 DIAGNOSIS — Z96641 Presence of right artificial hip joint: Secondary | ICD-10-CM | POA: Diagnosis not present

## 2023-01-25 DIAGNOSIS — G43909 Migraine, unspecified, not intractable, without status migrainosus: Secondary | ICD-10-CM | POA: Diagnosis not present

## 2023-01-25 DIAGNOSIS — G2119 Other drug induced secondary parkinsonism: Secondary | ICD-10-CM | POA: Diagnosis not present

## 2023-01-25 DIAGNOSIS — M109 Gout, unspecified: Secondary | ICD-10-CM | POA: Diagnosis not present

## 2023-01-25 DIAGNOSIS — S72031D Displaced midcervical fracture of right femur, subsequent encounter for closed fracture with routine healing: Secondary | ICD-10-CM | POA: Diagnosis not present

## 2023-01-25 DIAGNOSIS — G4730 Sleep apnea, unspecified: Secondary | ICD-10-CM | POA: Diagnosis not present

## 2023-01-25 DIAGNOSIS — N1832 Chronic kidney disease, stage 3b: Secondary | ICD-10-CM | POA: Diagnosis not present

## 2023-01-25 DIAGNOSIS — M459 Ankylosing spondylitis of unspecified sites in spine: Secondary | ICD-10-CM | POA: Diagnosis not present

## 2023-01-25 DIAGNOSIS — I129 Hypertensive chronic kidney disease with stage 1 through stage 4 chronic kidney disease, or unspecified chronic kidney disease: Secondary | ICD-10-CM | POA: Diagnosis not present

## 2023-01-27 DIAGNOSIS — S72031D Displaced midcervical fracture of right femur, subsequent encounter for closed fracture with routine healing: Secondary | ICD-10-CM | POA: Diagnosis not present

## 2023-01-27 DIAGNOSIS — N1832 Chronic kidney disease, stage 3b: Secondary | ICD-10-CM | POA: Diagnosis not present

## 2023-01-27 DIAGNOSIS — G43909 Migraine, unspecified, not intractable, without status migrainosus: Secondary | ICD-10-CM | POA: Diagnosis not present

## 2023-01-27 DIAGNOSIS — I129 Hypertensive chronic kidney disease with stage 1 through stage 4 chronic kidney disease, or unspecified chronic kidney disease: Secondary | ICD-10-CM | POA: Diagnosis not present

## 2023-01-27 DIAGNOSIS — M459 Ankylosing spondylitis of unspecified sites in spine: Secondary | ICD-10-CM | POA: Diagnosis not present

## 2023-01-27 DIAGNOSIS — G4730 Sleep apnea, unspecified: Secondary | ICD-10-CM | POA: Diagnosis not present

## 2023-01-27 DIAGNOSIS — Z96641 Presence of right artificial hip joint: Secondary | ICD-10-CM | POA: Diagnosis not present

## 2023-01-27 DIAGNOSIS — M109 Gout, unspecified: Secondary | ICD-10-CM | POA: Diagnosis not present

## 2023-01-27 DIAGNOSIS — G2119 Other drug induced secondary parkinsonism: Secondary | ICD-10-CM | POA: Diagnosis not present

## 2023-01-29 DIAGNOSIS — Z96641 Presence of right artificial hip joint: Secondary | ICD-10-CM | POA: Diagnosis not present

## 2023-01-29 DIAGNOSIS — N183 Chronic kidney disease, stage 3 unspecified: Secondary | ICD-10-CM | POA: Diagnosis not present

## 2023-01-29 DIAGNOSIS — M459 Ankylosing spondylitis of unspecified sites in spine: Secondary | ICD-10-CM | POA: Diagnosis not present

## 2023-01-29 DIAGNOSIS — I129 Hypertensive chronic kidney disease with stage 1 through stage 4 chronic kidney disease, or unspecified chronic kidney disease: Secondary | ICD-10-CM | POA: Diagnosis not present

## 2023-01-29 DIAGNOSIS — I700 Atherosclerosis of aorta: Secondary | ICD-10-CM | POA: Diagnosis not present

## 2023-02-02 DIAGNOSIS — H25812 Combined forms of age-related cataract, left eye: Secondary | ICD-10-CM | POA: Diagnosis not present

## 2023-02-03 DIAGNOSIS — N1832 Chronic kidney disease, stage 3b: Secondary | ICD-10-CM | POA: Diagnosis not present

## 2023-02-03 DIAGNOSIS — G2119 Other drug induced secondary parkinsonism: Secondary | ICD-10-CM | POA: Diagnosis not present

## 2023-02-03 DIAGNOSIS — M459 Ankylosing spondylitis of unspecified sites in spine: Secondary | ICD-10-CM | POA: Diagnosis not present

## 2023-02-03 DIAGNOSIS — S72031D Displaced midcervical fracture of right femur, subsequent encounter for closed fracture with routine healing: Secondary | ICD-10-CM | POA: Diagnosis not present

## 2023-02-03 DIAGNOSIS — M109 Gout, unspecified: Secondary | ICD-10-CM | POA: Diagnosis not present

## 2023-02-03 DIAGNOSIS — G43909 Migraine, unspecified, not intractable, without status migrainosus: Secondary | ICD-10-CM | POA: Diagnosis not present

## 2023-02-03 DIAGNOSIS — Z96641 Presence of right artificial hip joint: Secondary | ICD-10-CM | POA: Diagnosis not present

## 2023-02-03 DIAGNOSIS — G4730 Sleep apnea, unspecified: Secondary | ICD-10-CM | POA: Diagnosis not present

## 2023-02-03 DIAGNOSIS — I129 Hypertensive chronic kidney disease with stage 1 through stage 4 chronic kidney disease, or unspecified chronic kidney disease: Secondary | ICD-10-CM | POA: Diagnosis not present

## 2023-02-09 DIAGNOSIS — Z8669 Personal history of other diseases of the nervous system and sense organs: Secondary | ICD-10-CM | POA: Diagnosis not present

## 2023-02-09 DIAGNOSIS — G4733 Obstructive sleep apnea (adult) (pediatric): Secondary | ICD-10-CM | POA: Diagnosis not present

## 2023-02-09 DIAGNOSIS — N183 Chronic kidney disease, stage 3 unspecified: Secondary | ICD-10-CM | POA: Diagnosis not present

## 2023-02-09 DIAGNOSIS — I129 Hypertensive chronic kidney disease with stage 1 through stage 4 chronic kidney disease, or unspecified chronic kidney disease: Secondary | ICD-10-CM | POA: Diagnosis not present

## 2023-02-09 DIAGNOSIS — F17290 Nicotine dependence, other tobacco product, uncomplicated: Secondary | ICD-10-CM | POA: Diagnosis not present

## 2023-02-09 DIAGNOSIS — M459 Ankylosing spondylitis of unspecified sites in spine: Secondary | ICD-10-CM | POA: Diagnosis not present

## 2023-02-09 DIAGNOSIS — H25812 Combined forms of age-related cataract, left eye: Secondary | ICD-10-CM | POA: Diagnosis not present

## 2023-02-10 DIAGNOSIS — H35372 Puckering of macula, left eye: Secondary | ICD-10-CM | POA: Diagnosis not present

## 2023-02-10 DIAGNOSIS — G510 Bell's palsy: Secondary | ICD-10-CM | POA: Diagnosis not present

## 2023-02-10 DIAGNOSIS — Z961 Presence of intraocular lens: Secondary | ICD-10-CM | POA: Diagnosis not present

## 2023-02-10 DIAGNOSIS — R6889 Other general symptoms and signs: Secondary | ICD-10-CM | POA: Diagnosis not present

## 2023-02-10 DIAGNOSIS — Z48810 Encounter for surgical aftercare following surgery on the sense organs: Secondary | ICD-10-CM | POA: Diagnosis not present

## 2023-02-10 DIAGNOSIS — H20023 Recurrent acute iridocyclitis, bilateral: Secondary | ICD-10-CM | POA: Diagnosis not present

## 2023-02-17 ENCOUNTER — Ambulatory Visit (INDEPENDENT_AMBULATORY_CARE_PROVIDER_SITE_OTHER): Payer: Medicare HMO | Admitting: Orthopedic Surgery

## 2023-02-17 DIAGNOSIS — Z96641 Presence of right artificial hip joint: Secondary | ICD-10-CM

## 2023-02-17 DIAGNOSIS — R6889 Other general symptoms and signs: Secondary | ICD-10-CM | POA: Diagnosis not present

## 2023-02-20 ENCOUNTER — Encounter: Payer: Self-pay | Admitting: Orthopedic Surgery

## 2023-02-20 NOTE — Progress Notes (Signed)
   Post-Op Visit Note   Patient: Chad Winters           Date of Birth: [birth year]           MRN: [id number] Visit Date: 02/17/2023 PCP: Pcp, No   Assessment & Plan:  Chief Complaint:  Chief Complaint  Patient presents with   Right Hip - Follow-up      12/16/22 right THA for femoral neck fx     Visit Diagnoses: No diagnosis found.  Plan: Presents now about 8 weeks out from hip replacement for fracture.  Ambulating with cane.  On examination his leg lengths are equal.  Very good hip flexion strength.  Plan at this time is continue with activity as tolerated.  Follow-up as needed.  Follow-Up Instructions: No follow-ups on file.   Orders:  No orders of the defined types were placed in this encounter.  No orders of the defined types were placed in this encounter.   Imaging: No results found.  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  Past Surgical History:  Procedure Laterality Date   BRAIN SURGERY     TOTAL HIP ARTHROPLASTY Left    TOTAL HIP ARTHROPLASTY Right 12/16/2022   Procedure: RIGHT TOTAL HIP ARTHROPLASTY ANTERIOR APPROACH;  Surgeon: Meredith Pel, MD;  Location: [facility];  Service: Orthopedics;  Laterality: Right;   Social History   Occupational History   Not on file  Tobacco Use   Smoking status: Every Day    Types: Cigars   Smokeless tobacco: Never  Substance and Sexual Activity   Alcohol use: Yes    Comment: Occasionally   Drug use: Never   Sexual activity: Not on file

## 2023-03-03 DIAGNOSIS — R6889 Other general symptoms and signs: Secondary | ICD-10-CM | POA: Diagnosis not present

## 2023-03-09 DIAGNOSIS — E876 Hypokalemia: Secondary | ICD-10-CM | POA: Diagnosis not present

## 2023-03-09 DIAGNOSIS — Z85528 Personal history of other malignant neoplasm of kidney: Secondary | ICD-10-CM | POA: Diagnosis not present

## 2023-03-09 DIAGNOSIS — Z8679 Personal history of other diseases of the circulatory system: Secondary | ICD-10-CM | POA: Diagnosis not present

## 2023-03-09 DIAGNOSIS — I493 Ventricular premature depolarization: Secondary | ICD-10-CM | POA: Diagnosis not present

## 2023-03-17 DIAGNOSIS — I493 Ventricular premature depolarization: Secondary | ICD-10-CM | POA: Diagnosis not present

## 2023-03-17 DIAGNOSIS — I498 Other specified cardiac arrhythmias: Secondary | ICD-10-CM | POA: Diagnosis not present

## 2023-03-31 DIAGNOSIS — Z961 Presence of intraocular lens: Secondary | ICD-10-CM | POA: Diagnosis not present

## 2023-03-31 DIAGNOSIS — R6889 Other general symptoms and signs: Secondary | ICD-10-CM | POA: Diagnosis not present

## 2023-03-31 DIAGNOSIS — G510 Bell's palsy: Secondary | ICD-10-CM | POA: Diagnosis not present

## 2023-03-31 DIAGNOSIS — Z9842 Cataract extraction status, left eye: Secondary | ICD-10-CM | POA: Diagnosis not present

## 2023-03-31 DIAGNOSIS — H20023 Recurrent acute iridocyclitis, bilateral: Secondary | ICD-10-CM | POA: Diagnosis not present

## 2023-04-17 ENCOUNTER — Emergency Department (HOSPITAL_COMMUNITY): Payer: Medicare HMO

## 2023-04-17 ENCOUNTER — Encounter (HOSPITAL_COMMUNITY): Payer: Self-pay | Admitting: Emergency Medicine

## 2023-04-17 ENCOUNTER — Inpatient Hospital Stay (HOSPITAL_COMMUNITY): Payer: Medicare HMO

## 2023-04-17 ENCOUNTER — Other Ambulatory Visit: Payer: Self-pay

## 2023-04-17 ENCOUNTER — Inpatient Hospital Stay (HOSPITAL_COMMUNITY)
Admission: EM | Admit: 2023-04-17 | Discharge: 2023-04-29 | DRG: 871 | Disposition: A | Discharge status: Skilled Nursing Facility | Payer: Medicare HMO | Attending: Internal Medicine | Admitting: Internal Medicine

## 2023-04-17 DIAGNOSIS — N170 Acute kidney failure with tubular necrosis: Secondary | ICD-10-CM | POA: Insufficient documentation

## 2023-04-17 DIAGNOSIS — K769 Liver disease, unspecified: Secondary | ICD-10-CM | POA: Insufficient documentation

## 2023-04-17 DIAGNOSIS — N179 Acute kidney failure, unspecified: Secondary | ICD-10-CM

## 2023-04-17 DIAGNOSIS — I10 Essential (primary) hypertension: Secondary | ICD-10-CM | POA: Insufficient documentation

## 2023-04-17 DIAGNOSIS — D62 Acute posthemorrhagic anemia: Secondary | ICD-10-CM | POA: Insufficient documentation

## 2023-04-17 DIAGNOSIS — A419 Sepsis, unspecified organism: Secondary | ICD-10-CM

## 2023-04-17 DIAGNOSIS — R4182 Altered mental status, unspecified: Principal | ICD-10-CM

## 2023-04-17 DIAGNOSIS — W19XXXA Unspecified fall, initial encounter: Secondary | ICD-10-CM

## 2023-04-17 DIAGNOSIS — E872 Acidosis, unspecified: Secondary | ICD-10-CM

## 2023-04-17 DIAGNOSIS — S12690A Other displaced fracture of seventh cervical vertebra, initial encounter for closed fracture: Secondary | ICD-10-CM

## 2023-04-17 DIAGNOSIS — J9601 Acute respiratory failure with hypoxia: Secondary | ICD-10-CM | POA: Insufficient documentation

## 2023-04-17 DIAGNOSIS — J9600 Acute respiratory failure, unspecified whether with hypoxia or hypercapnia: Secondary | ICD-10-CM

## 2023-04-17 DIAGNOSIS — S0990XA Unspecified injury of head, initial encounter: Secondary | ICD-10-CM

## 2023-04-17 DIAGNOSIS — G9340 Encephalopathy, unspecified: Secondary | ICD-10-CM | POA: Diagnosis present

## 2023-04-17 DIAGNOSIS — S22019A Unspecified fracture of first thoracic vertebra, initial encounter for closed fracture: Secondary | ICD-10-CM | POA: Insufficient documentation

## 2023-04-17 DIAGNOSIS — I6203 Nontraumatic chronic subdural hemorrhage: Secondary | ICD-10-CM | POA: Insufficient documentation

## 2023-04-17 DIAGNOSIS — E440 Moderate protein-calorie malnutrition: Secondary | ICD-10-CM | POA: Insufficient documentation

## 2023-04-17 DIAGNOSIS — S12601A Unspecified nondisplaced fracture of seventh cervical vertebra, initial encounter for closed fracture: Secondary | ICD-10-CM

## 2023-04-17 DIAGNOSIS — K7200 Acute and subacute hepatic failure without coma: Secondary | ICD-10-CM | POA: Insufficient documentation

## 2023-04-17 DIAGNOSIS — R1310 Dysphagia, unspecified: Secondary | ICD-10-CM

## 2023-04-17 DIAGNOSIS — S22019D Unspecified fracture of first thoracic vertebra, subsequent encounter for fracture with routine healing: Secondary | ICD-10-CM | POA: Diagnosis not present

## 2023-04-17 DIAGNOSIS — S12691A Other nondisplaced fracture of seventh cervical vertebra, initial encounter for closed fracture: Secondary | ICD-10-CM | POA: Diagnosis present

## 2023-04-17 DIAGNOSIS — K760 Fatty (change of) liver, not elsewhere classified: Secondary | ICD-10-CM | POA: Diagnosis not present

## 2023-04-17 DIAGNOSIS — K529 Noninfective gastroenteritis and colitis, unspecified: Secondary | ICD-10-CM | POA: Diagnosis present

## 2023-04-17 DIAGNOSIS — D6959 Other secondary thrombocytopenia: Secondary | ICD-10-CM | POA: Diagnosis present

## 2023-04-17 DIAGNOSIS — S22060A Wedge compression fracture of T7-T8 vertebra, initial encounter for closed fracture: Secondary | ICD-10-CM | POA: Diagnosis not present

## 2023-04-17 DIAGNOSIS — E785 Hyperlipidemia, unspecified: Secondary | ICD-10-CM | POA: Diagnosis present

## 2023-04-17 DIAGNOSIS — Z4689 Encounter for fitting and adjustment of other specified devices: Secondary | ICD-10-CM | POA: Diagnosis not present

## 2023-04-17 DIAGNOSIS — S22018A Other fracture of first thoracic vertebra, initial encounter for closed fracture: Secondary | ICD-10-CM | POA: Diagnosis present

## 2023-04-17 DIAGNOSIS — Z85528 Personal history of other malignant neoplasm of kidney: Secondary | ICD-10-CM

## 2023-04-17 DIAGNOSIS — E861 Hypovolemia: Secondary | ICD-10-CM | POA: Diagnosis present

## 2023-04-17 DIAGNOSIS — S81812A Laceration without foreign body, left lower leg, initial encounter: Secondary | ICD-10-CM | POA: Diagnosis present

## 2023-04-17 DIAGNOSIS — S3991XA Unspecified injury of abdomen, initial encounter: Secondary | ICD-10-CM | POA: Diagnosis not present

## 2023-04-17 DIAGNOSIS — N1832 Chronic kidney disease, stage 3b: Secondary | ICD-10-CM | POA: Diagnosis not present

## 2023-04-17 DIAGNOSIS — I9589 Other hypotension: Secondary | ICD-10-CM | POA: Diagnosis not present

## 2023-04-17 DIAGNOSIS — S065X0A Traumatic subdural hemorrhage without loss of consciousness, initial encounter: Secondary | ICD-10-CM | POA: Diagnosis not present

## 2023-04-17 DIAGNOSIS — R945 Abnormal results of liver function studies: Secondary | ICD-10-CM | POA: Diagnosis not present

## 2023-04-17 DIAGNOSIS — F918 Other conduct disorders: Secondary | ICD-10-CM | POA: Diagnosis not present

## 2023-04-17 DIAGNOSIS — K7010 Alcoholic hepatitis without ascites: Secondary | ICD-10-CM | POA: Diagnosis present

## 2023-04-17 DIAGNOSIS — S0101XD Laceration without foreign body of scalp, subsequent encounter: Secondary | ICD-10-CM | POA: Diagnosis not present

## 2023-04-17 DIAGNOSIS — R6521 Severe sepsis with septic shock: Secondary | ICD-10-CM | POA: Diagnosis present

## 2023-04-17 DIAGNOSIS — E8809 Other disorders of plasma-protein metabolism, not elsewhere classified: Secondary | ICD-10-CM | POA: Diagnosis present

## 2023-04-17 DIAGNOSIS — E876 Hypokalemia: Secondary | ICD-10-CM | POA: Diagnosis not present

## 2023-04-17 DIAGNOSIS — M4853XA Collapsed vertebra, not elsewhere classified, cervicothoracic region, initial encounter for fracture: Secondary | ICD-10-CM | POA: Diagnosis not present

## 2023-04-17 DIAGNOSIS — Y906 Blood alcohol level of 120-199 mg/100 ml: Secondary | ICD-10-CM | POA: Diagnosis not present

## 2023-04-17 DIAGNOSIS — F1010 Alcohol abuse, uncomplicated: Secondary | ICD-10-CM | POA: Diagnosis present

## 2023-04-17 DIAGNOSIS — S41111A Laceration without foreign body of right upper arm, initial encounter: Secondary | ICD-10-CM | POA: Diagnosis present

## 2023-04-17 DIAGNOSIS — I129 Hypertensive chronic kidney disease with stage 1 through stage 4 chronic kidney disease, or unspecified chronic kidney disease: Secondary | ICD-10-CM | POA: Diagnosis not present

## 2023-04-17 DIAGNOSIS — N5089 Other specified disorders of the male genital organs: Secondary | ICD-10-CM | POA: Diagnosis present

## 2023-04-17 DIAGNOSIS — S0101XA Laceration without foreign body of scalp, initial encounter: Secondary | ICD-10-CM | POA: Diagnosis present

## 2023-04-17 DIAGNOSIS — Y92009 Unspecified place in unspecified non-institutional (private) residence as the place of occurrence of the external cause: Secondary | ICD-10-CM | POA: Diagnosis not present

## 2023-04-17 DIAGNOSIS — S41112A Laceration without foreign body of left upper arm, initial encounter: Secondary | ICD-10-CM | POA: Diagnosis present

## 2023-04-17 DIAGNOSIS — Z751 Person awaiting admission to adequate facility elsewhere: Secondary | ICD-10-CM

## 2023-04-17 DIAGNOSIS — K7689 Other specified diseases of liver: Secondary | ICD-10-CM | POA: Diagnosis not present

## 2023-04-17 DIAGNOSIS — G9349 Other encephalopathy: Secondary | ICD-10-CM | POA: Diagnosis not present

## 2023-04-17 DIAGNOSIS — D649 Anemia, unspecified: Secondary | ICD-10-CM | POA: Diagnosis not present

## 2023-04-17 DIAGNOSIS — Z905 Acquired absence of kidney: Secondary | ICD-10-CM

## 2023-04-17 DIAGNOSIS — W11XXXA Fall on and from ladder, initial encounter: Secondary | ICD-10-CM | POA: Diagnosis present

## 2023-04-17 DIAGNOSIS — G9341 Metabolic encephalopathy: Secondary | ICD-10-CM | POA: Diagnosis not present

## 2023-04-17 DIAGNOSIS — K858 Other acute pancreatitis without necrosis or infection: Secondary | ICD-10-CM | POA: Diagnosis present

## 2023-04-17 DIAGNOSIS — Z881 Allergy status to other antibiotic agents status: Secondary | ICD-10-CM

## 2023-04-17 DIAGNOSIS — E874 Mixed disorder of acid-base balance: Secondary | ICD-10-CM | POA: Diagnosis present

## 2023-04-17 DIAGNOSIS — G928 Other toxic encephalopathy: Secondary | ICD-10-CM | POA: Diagnosis not present

## 2023-04-17 DIAGNOSIS — Z23 Encounter for immunization: Secondary | ICD-10-CM | POA: Diagnosis not present

## 2023-04-17 DIAGNOSIS — Y92008 Other place in unspecified non-institutional (private) residence as the place of occurrence of the external cause: Secondary | ICD-10-CM | POA: Diagnosis not present

## 2023-04-17 DIAGNOSIS — M459 Ankylosing spondylitis of unspecified sites in spine: Secondary | ICD-10-CM | POA: Diagnosis not present

## 2023-04-17 DIAGNOSIS — S065XAA Traumatic subdural hemorrhage with loss of consciousness status unknown, initial encounter: Secondary | ICD-10-CM | POA: Diagnosis present

## 2023-04-17 DIAGNOSIS — Z7401 Bed confinement status: Secondary | ICD-10-CM | POA: Diagnosis not present

## 2023-04-17 DIAGNOSIS — W19XXXD Unspecified fall, subsequent encounter: Secondary | ICD-10-CM | POA: Diagnosis not present

## 2023-04-17 DIAGNOSIS — N183 Chronic kidney disease, stage 3 unspecified: Secondary | ICD-10-CM | POA: Diagnosis not present

## 2023-04-17 DIAGNOSIS — S81811A Laceration without foreign body, right lower leg, initial encounter: Secondary | ICD-10-CM | POA: Diagnosis present

## 2023-04-17 DIAGNOSIS — S3993XA Unspecified injury of pelvis, initial encounter: Secondary | ICD-10-CM | POA: Diagnosis not present

## 2023-04-17 DIAGNOSIS — Z90411 Acquired partial absence of pancreas: Secondary | ICD-10-CM

## 2023-04-17 DIAGNOSIS — S299XXA Unspecified injury of thorax, initial encounter: Secondary | ICD-10-CM | POA: Diagnosis not present

## 2023-04-17 DIAGNOSIS — Z8249 Family history of ischemic heart disease and other diseases of the circulatory system: Secondary | ICD-10-CM

## 2023-04-17 DIAGNOSIS — R0902 Hypoxemia: Secondary | ICD-10-CM | POA: Diagnosis not present

## 2023-04-17 DIAGNOSIS — S12601D Unspecified nondisplaced fracture of seventh cervical vertebra, subsequent encounter for fracture with routine healing: Secondary | ICD-10-CM | POA: Diagnosis not present

## 2023-04-17 DIAGNOSIS — N400 Enlarged prostate without lower urinary tract symptoms: Secondary | ICD-10-CM | POA: Diagnosis present

## 2023-04-17 DIAGNOSIS — T1490XA Injury, unspecified, initial encounter: Secondary | ICD-10-CM | POA: Diagnosis not present

## 2023-04-17 DIAGNOSIS — Z4659 Encounter for fitting and adjustment of other gastrointestinal appliance and device: Secondary | ICD-10-CM | POA: Diagnosis not present

## 2023-04-17 DIAGNOSIS — E860 Dehydration: Secondary | ICD-10-CM | POA: Diagnosis present

## 2023-04-17 DIAGNOSIS — I491 Atrial premature depolarization: Secondary | ICD-10-CM | POA: Diagnosis not present

## 2023-04-17 DIAGNOSIS — S2231XA Fracture of one rib, right side, initial encounter for closed fracture: Secondary | ICD-10-CM | POA: Diagnosis not present

## 2023-04-17 LAB — RAPID URINE DRUG SCREEN, HOSP PERFORMED
Amphetamines: NOT DETECTED
Barbiturates: NOT DETECTED
Benzodiazepines: NOT DETECTED
Cocaine: NOT DETECTED
Opiates: NOT DETECTED
Tetrahydrocannabinol: NOT DETECTED

## 2023-04-17 LAB — TYPE AND SCREEN: ABO/RH(D): A POS

## 2023-04-17 LAB — BPAM FFP
Blood Product Expiration Date: 202405132359
Blood Product Expiration Date: 202405132359
Blood Product Expiration Date: 202405132359
Blood Product Expiration Date: 202405132359
Blood Product Expiration Date: 202405152359
Blood Product Expiration Date: 202405172359
Blood Product Expiration Date: 202405172359
Blood Product Expiration Date: 202405172359
Blood Product Expiration Date: 202405202359
Blood Product Expiration Date: 202405202359
Blood Product Expiration Date: 202405202359
Blood Product Expiration Date: 202405212359
ISSUE DATE / TIME: 202405040100
ISSUE DATE / TIME: 202405040100
ISSUE DATE / TIME: 202405040100
ISSUE DATE / TIME: 202405040100
ISSUE DATE / TIME: 202405040100
ISSUE DATE / TIME: 202405040100
ISSUE DATE / TIME: 202405040100
ISSUE DATE / TIME: 202405040100
ISSUE DATE / TIME: 202405040113
ISSUE DATE / TIME: 202405040113
ISSUE DATE / TIME: 202405040113
ISSUE DATE / TIME: 202405040113
Unit Type and Rh: 6200
Unit Type and Rh: 6200
Unit Type and Rh: 6200
Unit Type and Rh: 6200
Unit Type and Rh: 6200
Unit Type and Rh: 6200
Unit Type and Rh: 6200
Unit Type and Rh: 6200
Unit Type and Rh: 6200
Unit Type and Rh: 6200
Unit Type and Rh: 6200
Unit Type and Rh: 6200

## 2023-04-17 LAB — PREPARE FRESH FROZEN PLASMA
Unit division: 0
Unit division: 0
Unit division: 0
Unit division: 0
Unit division: 0
Unit division: 0
Unit division: 0
Unit division: 0
Unit division: 0
Unit division: 0
Unit division: 0
Unit division: 0

## 2023-04-17 LAB — I-STAT CHEM 8, ED
BUN: 30 mg/dL — ABNORMAL HIGH (ref 8–23)
Calcium, Ion: 0.78 mmol/L — CL (ref 1.15–1.40)
Chloride: 108 mmol/L (ref 98–111)
Creatinine, Ser: 2.2 mg/dL — ABNORMAL HIGH (ref 0.61–1.24)
Glucose, Bld: 87 mg/dL (ref 70–99)
HCT: 30 % — ABNORMAL LOW (ref 39.0–52.0)
Hemoglobin: 10.2 g/dL — ABNORMAL LOW (ref 13.0–17.0)
Potassium: 5.4 mmol/L — ABNORMAL HIGH (ref 3.5–5.1)
Sodium: 133 mmol/L — ABNORMAL LOW (ref 135–145)
TCO2: 8 mmol/L — ABNORMAL LOW (ref 22–32)

## 2023-04-17 LAB — I-STAT ARTERIAL BLOOD GAS, ED
Acid-base deficit: 27 mmol/L — ABNORMAL HIGH (ref 0.0–2.0)
Bicarbonate: 4.7 mmol/L — ABNORMAL LOW (ref 20.0–28.0)
Calcium, Ion: 0.96 mmol/L — ABNORMAL LOW (ref 1.15–1.40)
HCT: 27 % — ABNORMAL LOW (ref 39.0–52.0)
Hemoglobin: 9.2 g/dL — ABNORMAL LOW (ref 13.0–17.0)
O2 Saturation: 100 %
Patient temperature: 90.8
Potassium: 3.6 mmol/L (ref 3.5–5.1)
Sodium: 133 mmol/L — ABNORMAL LOW (ref 135–145)
TCO2: 5 mmol/L — ABNORMAL LOW (ref 22–32)
pCO2 arterial: 20 mmHg — ABNORMAL LOW (ref 32–48)
pH, Arterial: 6.943 — CL (ref 7.35–7.45)
pO2, Arterial: 571 mmHg — ABNORMAL HIGH (ref 83–108)

## 2023-04-17 LAB — CULTURE, BLOOD (ROUTINE X 2)

## 2023-04-17 LAB — PREPARE PLATELET PHERESIS: Unit division: 0

## 2023-04-17 LAB — COMPREHENSIVE METABOLIC PANEL
ALT: 350 U/L — ABNORMAL HIGH (ref 0–44)
ALT: 1017 U/L — ABNORMAL HIGH (ref 0–44)
AST: 927 U/L — ABNORMAL HIGH (ref 15–41)
AST: 3014 U/L — ABNORMAL HIGH (ref 15–41)
Albumin: 1.8 g/dL — ABNORMAL LOW (ref 3.5–5.0)
Albumin: 1.5 g/dL — ABNORMAL LOW (ref 3.5–5.0)
Alkaline Phosphatase: 111 U/L (ref 38–126)
Alkaline Phosphatase: 111 U/L (ref 38–126)
Anion gap: 25 — ABNORMAL HIGH (ref 5–15)
BUN: 28 mg/dL — ABNORMAL HIGH (ref 8–23)
BUN: 32 mg/dL — ABNORMAL HIGH (ref 8–23)
CO2: <7 mmol/L — ABNORMAL LOW (ref 22–32)
CO2: 18 mmol/L — ABNORMAL LOW (ref 22–32)
Calcium: 7.4 mg/dL — ABNORMAL LOW (ref 8.9–10.3)
Calcium: 6.6 mg/dL — ABNORMAL LOW (ref 8.9–10.3)
Chloride: 98 mmol/L (ref 98–111)
Chloride: 89 mmol/L — ABNORMAL LOW (ref 98–111)
Creatinine, Ser: 2.03 mg/dL — ABNORMAL HIGH (ref 0.61–1.24)
Creatinine, Ser: 2.48 mg/dL — ABNORMAL HIGH (ref 0.61–1.24)
GFR, Estimated: 34 mL/min — ABNORMAL LOW (ref >60)
GFR, Estimated: 26 mL/min — ABNORMAL LOW (ref >60)
Glucose, Bld: 83 mg/dL (ref 70–99)
Glucose, Bld: 164 mg/dL — ABNORMAL HIGH (ref 70–99)
Potassium: 3.3 mmol/L — ABNORMAL LOW (ref 3.5–5.1)
Potassium: 3.8 mmol/L (ref 3.5–5.1)
Sodium: 134 mmol/L — ABNORMAL LOW (ref 135–145)
Sodium: 132 mmol/L — ABNORMAL LOW (ref 135–145)
Total Bilirubin: 0.4 mg/dL (ref 0.3–1.2)
Total Bilirubin: 1 mg/dL (ref 0.3–1.2)
Total Protein: 4.7 g/dL — ABNORMAL LOW (ref 6.5–8.1)
Total Protein: 3.9 g/dL — ABNORMAL LOW (ref 6.5–8.1)

## 2023-04-17 LAB — HEPATITIS PANEL, ACUTE
HCV Ab: NONREACTIVE
Hep A IgM: NONREACTIVE
Hep B C IgM: NONREACTIVE
Hepatitis B Surface Ag: NONREACTIVE

## 2023-04-17 LAB — CBC
HCT: 31.1 % — ABNORMAL LOW (ref 39.0–52.0)
HCT: 20.8 % — ABNORMAL LOW (ref 39.0–52.0)
Hemoglobin: 9.6 g/dL — ABNORMAL LOW (ref 13.0–17.0)
Hemoglobin: 7.3 g/dL — ABNORMAL LOW (ref 13.0–17.0)
MCH: 30.3 pg (ref 26.0–34.0)
MCH: 29.9 pg (ref 26.0–34.0)
MCHC: 30.9 g/dL (ref 30.0–36.0)
MCHC: 35.1 g/dL (ref 30.0–36.0)
MCV: 98.1 fL (ref 80.0–100.0)
MCV: 85.2 fL (ref 80.0–100.0)
Platelets: 189 10*3/uL (ref 150–400)
Platelets: 153 10*3/uL (ref 150–400)
RBC: 3.17 MIL/uL — ABNORMAL LOW (ref 4.22–5.81)
RBC: 2.44 MIL/uL — ABNORMAL LOW (ref 4.22–5.81)
RDW: 16 % — ABNORMAL HIGH (ref 11.5–15.5)
RDW: 17.5 % — ABNORMAL HIGH (ref 11.5–15.5)
WBC: 15.2 10*3/uL — ABNORMAL HIGH (ref 4.0–10.5)
WBC: 10.4 10*3/uL (ref 4.0–10.5)
nRBC: 0 % (ref 0.0–0.2)
nRBC: 0 % (ref 0.0–0.2)

## 2023-04-17 LAB — BASIC METABOLIC PANEL
Anion gap: 19 — ABNORMAL HIGH (ref 5–15)
BUN: 35 mg/dL — ABNORMAL HIGH (ref 8–23)
CO2: 22 mmol/L (ref 22–32)
Calcium: 7.1 mg/dL — ABNORMAL LOW (ref 8.9–10.3)
Chloride: 92 mmol/L — ABNORMAL LOW (ref 98–111)
Creatinine, Ser: 2.63 mg/dL — ABNORMAL HIGH (ref 0.61–1.24)
GFR, Estimated: 25 mL/min — ABNORMAL LOW (ref >60)
Glucose, Bld: 162 mg/dL — ABNORMAL HIGH (ref 70–99)
Potassium: 3.3 mmol/L — ABNORMAL LOW (ref 3.5–5.1)
Sodium: 133 mmol/L — ABNORMAL LOW (ref 135–145)

## 2023-04-17 LAB — I-STAT VENOUS BLOOD GAS, ED
Acid-base deficit: 30 mmol/L — ABNORMAL HIGH (ref 0.0–2.0)
Bicarbonate: 4 mmol/L — ABNORMAL LOW (ref 20.0–28.0)
Calcium, Ion: 0.8 mmol/L — CL (ref 1.15–1.40)
HCT: 30 % — ABNORMAL LOW (ref 39.0–52.0)
Hemoglobin: 10.2 g/dL — ABNORMAL LOW (ref 13.0–17.0)
O2 Saturation: 80 %
Potassium: 5.4 mmol/L — ABNORMAL HIGH (ref 3.5–5.1)
Sodium: 132 mmol/L — ABNORMAL LOW (ref 135–145)
TCO2: <5 mmol/L — ABNORMAL LOW (ref 22–32)
pCO2, Ven: 30.8 mmHg — ABNORMAL LOW (ref 44–60)
pH, Ven: 6.721 — CL (ref 7.25–7.43)
pO2, Ven: 87 mmHg — ABNORMAL HIGH (ref 32–45)

## 2023-04-17 LAB — LIPASE, BLOOD: Lipase: 197 U/L — ABNORMAL HIGH (ref 11–51)

## 2023-04-17 LAB — POCT I-STAT 7, (LYTES, BLD GAS, ICA,H+H)
Acid-base deficit: 23 mmol/L — ABNORMAL HIGH (ref 0.0–2.0)
Acid-base deficit: 6 mmol/L — ABNORMAL HIGH (ref 0.0–2.0)
Bicarbonate: 7.1 mmol/L — ABNORMAL LOW (ref 20.0–28.0)
Bicarbonate: 16.2 mmol/L — ABNORMAL LOW (ref 20.0–28.0)
Calcium, Ion: 0.94 mmol/L — ABNORMAL LOW (ref 1.15–1.40)
Calcium, Ion: 0.88 mmol/L — CL (ref 1.15–1.40)
HCT: 25 % — ABNORMAL LOW (ref 39.0–52.0)
HCT: 21 % — ABNORMAL LOW (ref 39.0–52.0)
Hemoglobin: 8.5 g/dL — ABNORMAL LOW (ref 13.0–17.0)
Hemoglobin: 7.1 g/dL — ABNORMAL LOW (ref 13.0–17.0)
O2 Saturation: 99 %
O2 Saturation: 97 %
Patient temperature: 33.9
Potassium: 3.9 mmol/L (ref 3.5–5.1)
Potassium: 3.9 mmol/L (ref 3.5–5.1)
Sodium: 133 mmol/L — ABNORMAL LOW (ref 135–145)
Sodium: 131 mmol/L — ABNORMAL LOW (ref 135–145)
TCO2: 8 mmol/L — ABNORMAL LOW (ref 22–32)
TCO2: 17 mmol/L — ABNORMAL LOW (ref 22–32)
pCO2 arterial: 25.5 mmHg — ABNORMAL LOW (ref 32–48)
pCO2 arterial: 19.4 mmHg — CL (ref 32–48)
pH, Arterial: 7.033 — CL (ref 7.35–7.45)
pH, Arterial: 7.529 — ABNORMAL HIGH (ref 7.35–7.45)
pO2, Arterial: 184 mmHg — ABNORMAL HIGH (ref 83–108)
pO2, Arterial: 76 mmHg — ABNORMAL LOW (ref 83–108)

## 2023-04-17 LAB — PREPARE CRYOPRECIPITATE: Unit division: 0

## 2023-04-17 LAB — BPAM PLATELET PHERESIS
Blood Product Expiration Date: 202405052359
ISSUE DATE / TIME: 202405040102
Unit Type and Rh: 9500

## 2023-04-17 LAB — AMMONIA: Ammonia: 143 umol/L — ABNORMAL HIGH (ref 9–35)

## 2023-04-17 LAB — BRAIN NATRIURETIC PEPTIDE: B Natriuretic Peptide: 197.4 pg/mL — ABNORMAL HIGH (ref 0.0–100.0)

## 2023-04-17 LAB — ETHANOL: Alcohol, Ethyl (B): 122 mg/dL — ABNORMAL HIGH (ref <10)

## 2023-04-17 LAB — URINALYSIS, ROUTINE W REFLEX MICROSCOPIC
Bilirubin Urine: NEGATIVE
Glucose, UA: NEGATIVE mg/dL
Hgb urine dipstick: NEGATIVE
Ketones, ur: NEGATIVE mg/dL
Leukocytes,Ua: NEGATIVE
Nitrite: NEGATIVE
Protein, ur: >=300 mg/dL — AB
Specific Gravity, Urine: 1.011 (ref 1.005–1.030)
pH: 6 (ref 5.0–8.0)

## 2023-04-17 LAB — PROTIME-INR
INR: 1.6 — ABNORMAL HIGH (ref 0.8–1.2)
Prothrombin Time: 18.7 seconds — ABNORMAL HIGH (ref 11.4–15.2)

## 2023-04-17 LAB — GLUCOSE, CAPILLARY
Glucose-Capillary: 133 mg/dL — ABNORMAL HIGH (ref 70–99)
Glucose-Capillary: 157 mg/dL — ABNORMAL HIGH (ref 70–99)
Glucose-Capillary: 155 mg/dL — ABNORMAL HIGH (ref 70–99)
Glucose-Capillary: 125 mg/dL — ABNORMAL HIGH (ref 70–99)
Glucose-Capillary: 99 mg/dL (ref 70–99)

## 2023-04-17 LAB — PROCALCITONIN: Procalcitonin: 0.13 ng/mL

## 2023-04-17 LAB — BPAM CRYOPRECIPITATE
Blood Product Expiration Date: 202405072359
ISSUE DATE / TIME: 202405040113
Unit Type and Rh: 5100

## 2023-04-17 LAB — APTT: aPTT: 45 seconds — ABNORMAL HIGH (ref 24–36)

## 2023-04-17 LAB — CK: Total CK: 359 U/L (ref 49–397)

## 2023-04-17 LAB — LACTIC ACID, PLASMA
Lactic Acid, Venous: >9 mmol/L (ref 0.5–1.9)
Lactic Acid, Venous: >9 mmol/L (ref 0.5–1.9)
Lactic Acid, Venous: >9 mmol/L (ref 0.5–1.9)
Lactic Acid, Venous: >9 mmol/L (ref 0.5–1.9)
Lactic Acid, Venous: 6.5 mmol/L (ref 0.5–1.9)

## 2023-04-17 LAB — PREPARE RBC (CROSSMATCH)

## 2023-04-17 LAB — ABO/RH: ABO/RH(D): A POS

## 2023-04-17 LAB — TSH: TSH: 2.027 u[IU]/mL (ref 0.350–4.500)

## 2023-04-17 LAB — TROPONIN I (HIGH SENSITIVITY)
Troponin I (High Sensitivity): 35 ng/L — ABNORMAL HIGH (ref <18)
Troponin I (High Sensitivity): 49 ng/L — ABNORMAL HIGH (ref <18)

## 2023-04-17 LAB — MAGNESIUM: Magnesium: 2.6 mg/dL — ABNORMAL HIGH (ref 1.7–2.4)

## 2023-04-17 LAB — CORTISOL: Cortisol, Plasma: 20.6 ug/dL

## 2023-04-17 LAB — MRSA NEXT GEN BY PCR, NASAL: MRSA by PCR Next Gen: NOT DETECTED

## 2023-04-17 LAB — CBG MONITORING, ED: Glucose-Capillary: 102 mg/dL — ABNORMAL HIGH (ref 70–99)

## 2023-04-17 MED ORDER — PIPERACILLIN-TAZOBACTAM 3.375 G IVPB 30 MIN
3.3750 g | Freq: Once | INTRAVENOUS | Status: AC
  Administered 2023-04-17: 3.375 g via INTRAVENOUS
  Filled 2023-04-17: qty 50

## 2023-04-17 MED ORDER — POTASSIUM CHLORIDE 10 MEQ/100ML IV SOLN
10.0000 meq | INTRAVENOUS | Status: AC
  Administered 2023-04-17 (×3): 10 meq via INTRAVENOUS
  Filled 2023-04-17 (×3): qty 100

## 2023-04-17 MED ORDER — CALCIUM GLUCONATE-NACL 1-0.675 GM/50ML-% IV SOLN: 1.0000 g | Freq: Once | INTRAVENOUS | Status: DC

## 2023-04-17 MED ORDER — SODIUM BICARBONATE 8.4 % IV SOLN
INTRAVENOUS | Status: AC
  Administered 2023-04-17: 100 meq via INTRAVENOUS
  Filled 2023-04-17: qty 100

## 2023-04-17 MED ORDER — VANCOMYCIN HCL IN DEXTROSE 1-5 GM/200ML-% IV SOLN: 1000.0000 mg | INTRAVENOUS | Status: DC

## 2023-04-17 MED ORDER — CEFAZOLIN SODIUM-DEXTROSE 2-4 GM/100ML-% IV SOLN
2.0000 g | Freq: Once | INTRAVENOUS | Status: AC
  Administered 2023-04-17: 2 g via INTRAVENOUS

## 2023-04-17 MED ORDER — POTASSIUM CHLORIDE 20 MEQ PO PACK
40.0000 meq | PACK | Freq: Once | ORAL | Status: AC
  Administered 2023-04-17: 40 meq
  Filled 2023-04-17: qty 2

## 2023-04-17 MED ORDER — CALCIUM GLUCONATE-NACL 1-0.675 GM/50ML-% IV SOLN
1.0000 g | Freq: Once | INTRAVENOUS | Status: AC
  Administered 2023-04-17: 1000 mg via INTRAVENOUS
  Filled 2023-04-17: qty 50

## 2023-04-17 MED ORDER — POLYETHYLENE GLYCOL 3350 17 G PO PACK: 17.0000 g | PACK | Freq: Every day | ORAL | Status: DC

## 2023-04-17 MED ORDER — NOREPINEPHRINE 4 MG/250ML-% IV SOLN: 2.0000 ug/min | INTRAVENOUS | Status: DC

## 2023-04-17 MED ORDER — SODIUM BICARBONATE 8.4 % IV SOLN
100.0000 meq | Freq: Once | INTRAVENOUS | Status: AC
  Administered 2023-04-17: 100 meq via INTRAVENOUS
  Filled 2023-04-17: qty 100

## 2023-04-17 MED ORDER — LACTATED RINGERS IV BOLUS
1000.0000 mL | Freq: Once | INTRAVENOUS | Status: AC
  Administered 2023-04-17: 1000 mL via INTRAVENOUS

## 2023-04-17 MED ORDER — VANCOMYCIN HCL 1250 MG/250ML IV SOLN
1250.0000 mg | Freq: Once | INTRAVENOUS | Status: AC
  Administered 2023-04-17: 1250 mg via INTRAVENOUS
  Filled 2023-04-17: qty 250

## 2023-04-17 MED ORDER — FOLIC ACID 5 MG/ML IJ SOLN
1.0000 mg | Freq: Every day | INTRAMUSCULAR | Status: DC
  Administered 2023-04-17 – 2023-04-23 (×7): 1 mg via INTRAVENOUS
  Filled 2023-04-17 (×12): qty 0.2

## 2023-04-17 MED ORDER — STERILE WATER FOR INJECTION IV SOLN
INTRAVENOUS | Status: DC
  Filled 2023-04-17 (×5): qty 1000

## 2023-04-17 MED ORDER — FENTANYL 2500MCG IN NS 250ML (10MCG/ML) PREMIX INFUSION
25.0000 ug/h | INTRAVENOUS | Status: DC
  Administered 2023-04-17: 150 ug/h via INTRAVENOUS
  Administered 2023-04-17: 25 ug/h via INTRAVENOUS
  Administered 2023-04-18 – 2023-04-19 (×2): 125 ug/h via INTRAVENOUS
  Filled 2023-04-17 (×4): qty 250

## 2023-04-17 MED ORDER — POTASSIUM CHLORIDE 20 MEQ PO PACK
40.0000 meq | PACK | ORAL | Status: AC
  Administered 2023-04-17 – 2023-04-18 (×2): 40 meq
  Filled 2023-04-17 (×2): qty 2

## 2023-04-17 MED ORDER — VASOPRESSIN 20 UNITS/100 ML INFUSION FOR SHOCK
0.0400 [IU]/min | INTRAVENOUS | Status: DC
  Administered 2023-04-17 (×2): 0.04 [IU]/min via INTRAVENOUS
  Filled 2023-04-17 (×2): qty 100

## 2023-04-17 MED ORDER — ETOMIDATE 2 MG/ML IV SOLN
INTRAVENOUS | Status: DC | PRN
  Administered 2023-04-17: 20 mg via INTRAVENOUS

## 2023-04-17 MED ORDER — FENTANYL CITRATE PF 50 MCG/ML IJ SOSY
PREFILLED_SYRINGE | INTRAMUSCULAR | Status: AC
  Filled 2023-04-17: qty 2

## 2023-04-17 MED ORDER — PANTOPRAZOLE SODIUM 40 MG IV SOLR
40.0000 mg | Freq: Every day | INTRAVENOUS | Status: DC
  Administered 2023-04-17 – 2023-04-24 (×8): 40 mg via INTRAVENOUS
  Filled 2023-04-17 (×8): qty 10

## 2023-04-17 MED ORDER — THIAMINE HCL 100 MG/ML IJ SOLN
100.0000 mg | Freq: Every day | INTRAMUSCULAR | Status: DC
  Administered 2023-04-17 – 2023-04-23 (×7): 100 mg via INTRAVENOUS
  Filled 2023-04-17 (×7): qty 2

## 2023-04-17 MED ORDER — IOHEXOL 350 MG/ML SOLN
75.0000 mL | Freq: Once | INTRAVENOUS | Status: AC | PRN
  Administered 2023-04-17: 75 mL via INTRAVENOUS

## 2023-04-17 MED ORDER — LACTULOSE 10 GM/15ML PO SOLN
30.0000 g | Freq: Once | ORAL | Status: AC
  Administered 2023-04-17: 30 g
  Filled 2023-04-17: qty 45

## 2023-04-17 MED ORDER — SODIUM BICARBONATE 8.4 % IV SOLN: 100.0000 meq | Freq: Once | INTRAVENOUS | Status: AC

## 2023-04-17 MED ORDER — PROPOFOL 1000 MG/100ML IV EMUL
5.0000 ug/kg/min | INTRAVENOUS | Status: DC
  Administered 2023-04-17: 5 ug/kg/min via INTRAVENOUS
  Filled 2023-04-17: qty 100

## 2023-04-17 MED ORDER — DOCUSATE SODIUM 50 MG/5ML PO LIQD: 100.0000 mg | Freq: Two times a day (BID) | ORAL | Status: DC

## 2023-04-17 MED ORDER — FENTANYL CITRATE PF 50 MCG/ML IJ SOSY
PREFILLED_SYRINGE | INTRAMUSCULAR | Status: AC
  Administered 2023-04-17: 100 ug via INTRAVENOUS
  Filled 2023-04-17: qty 2

## 2023-04-17 MED ORDER — ADULT MULTIVITAMIN W/MINERALS CH
1.0000 | ORAL_TABLET | Freq: Every day | ORAL | Status: DC
  Administered 2023-04-17 – 2023-04-28 (×10): 1
  Filled 2023-04-17 (×11): qty 1

## 2023-04-17 MED ORDER — PIPERACILLIN-TAZOBACTAM 3.375 G IVPB
3.3750 g | Freq: Three times a day (TID) | INTRAVENOUS | Status: DC
  Administered 2023-04-17 – 2023-04-18 (×4): 3.375 g via INTRAVENOUS
  Filled 2023-04-17 (×4): qty 50

## 2023-04-17 MED ORDER — POLYETHYLENE GLYCOL 3350 17 G PO PACK: 17.0000 g | PACK | Freq: Every day | ORAL | Status: DC | PRN

## 2023-04-17 MED ORDER — LACTATED RINGERS IV SOLN: INTRAVENOUS | Status: DC

## 2023-04-17 MED ORDER — SODIUM BICARBONATE 8.4 % IV SOLN
50.0000 meq | Freq: Once | INTRAVENOUS | Status: AC
  Administered 2023-04-17: 50 meq via INTRAVENOUS
  Filled 2023-04-17: qty 50

## 2023-04-17 MED ORDER — FENTANYL CITRATE PF 50 MCG/ML IJ SOSY
25.0000 ug | PREFILLED_SYRINGE | Freq: Once | INTRAMUSCULAR | Status: AC
  Administered 2023-04-17: 25 ug via INTRAVENOUS

## 2023-04-17 MED ORDER — FENTANYL CITRATE PF 50 MCG/ML IJ SOSY: 100.0000 ug | PREFILLED_SYRINGE | Freq: Once | INTRAMUSCULAR | Status: AC

## 2023-04-17 MED ORDER — LEVETIRACETAM IN NACL 1000 MG/100ML IV SOLN
1000.0000 mg | Freq: Once | INTRAVENOUS | Status: AC
  Administered 2023-04-17: 1000 mg via INTRAVENOUS
  Filled 2023-04-17: qty 100

## 2023-04-17 MED ORDER — TETANUS-DIPHTH-ACELL PERTUSSIS 5-2.5-18.5 LF-MCG/0.5 IM SUSY
0.5000 mL | PREFILLED_SYRINGE | Freq: Once | INTRAMUSCULAR | Status: AC
  Administered 2023-04-17: 0.5 mL via INTRAMUSCULAR

## 2023-04-17 MED ORDER — DOCUSATE SODIUM 50 MG/5ML PO LIQD: 100.0000 mg | Freq: Two times a day (BID) | ORAL | Status: DC | PRN

## 2023-04-17 MED ORDER — PROPOFOL 1000 MG/100ML IV EMUL
0.0000 ug/kg/min | INTRAVENOUS | Status: DC
  Administered 2023-04-17 – 2023-04-18 (×5): 30 ug/kg/min via INTRAVENOUS
  Administered 2023-04-19 (×2): 20 ug/kg/min via INTRAVENOUS
  Administered 2023-04-20: 35 ug/kg/min via INTRAVENOUS
  Filled 2023-04-17 (×7): qty 100

## 2023-04-17 MED ORDER — FENTANYL BOLUS VIA INFUSION
25.0000 ug | INTRAVENOUS | Status: DC | PRN
  Administered 2023-04-17: 100 ug via INTRAVENOUS
  Administered 2023-04-17: 50 ug via INTRAVENOUS
  Administered 2023-04-17 (×2): 100 ug via INTRAVENOUS
  Administered 2023-04-17: 50 ug via INTRAVENOUS

## 2023-04-17 MED ORDER — FENTANYL CITRATE PF 50 MCG/ML IJ SOSY
50.0000 ug | PREFILLED_SYRINGE | Freq: Once | INTRAMUSCULAR | Status: AC
  Administered 2023-04-17: 50 ug via INTRAVENOUS
  Filled 2023-04-17: qty 1

## 2023-04-17 MED ORDER — MAGNESIUM SULFATE 2 GM/50ML IV SOLN
2.0000 g | Freq: Once | INTRAVENOUS | Status: AC
  Administered 2023-04-17: 2 g via INTRAVENOUS
  Filled 2023-04-17: qty 50

## 2023-04-17 MED ORDER — VANCOMYCIN HCL 750 MG/150ML IV SOLN: 750.0000 mg | INTRAVENOUS | Status: DC

## 2023-04-17 MED ORDER — FENTANYL CITRATE (PF) 100 MCG/2ML IJ SOLN
INTRAMUSCULAR | Status: DC | PRN
  Administered 2023-04-17: 100 ug via INTRAVENOUS

## 2023-04-17 MED ORDER — SODIUM CHLORIDE 0.9% IV SOLUTION: Freq: Once | INTRAVENOUS | Status: AC

## 2023-04-17 MED ORDER — ORAL CARE MOUTH RINSE
15.0000 mL | OROMUCOSAL | Status: DC
  Administered 2023-04-17 – 2023-04-20 (×39): 15 mL via OROMUCOSAL

## 2023-04-17 MED ORDER — NOREPINEPHRINE 4 MG/250ML-% IV SOLN
0.0000 ug/min | INTRAVENOUS | Status: DC
  Administered 2023-04-17: 14 ug/min via INTRAVENOUS
  Administered 2023-04-17: 24 ug/min via INTRAVENOUS
  Administered 2023-04-17: 5 ug/min via INTRAVENOUS
  Administered 2023-04-18: 4 ug/min via INTRAVENOUS
  Filled 2023-04-17 (×4): qty 250

## 2023-04-17 MED ORDER — NOREPINEPHRINE 4 MG/250ML-% IV SOLN
INTRAVENOUS | Status: AC
  Administered 2023-04-17: 10 ug/min via INTRAVENOUS
  Filled 2023-04-17: qty 250

## 2023-04-17 MED ORDER — CHLORHEXIDINE GLUCONATE CLOTH 2 % EX PADS
6.0000 | MEDICATED_PAD | Freq: Every day | CUTANEOUS | Status: DC
  Administered 2023-04-17 – 2023-04-29 (×14): 6 via TOPICAL

## 2023-04-17 MED ORDER — SODIUM CHLORIDE 0.9 % IV SOLN: 250.0000 mL | INTRAVENOUS | Status: DC

## 2023-04-17 MED ORDER — ORAL CARE MOUTH RINSE: 15.0000 mL | OROMUCOSAL | Status: DC | PRN

## 2023-04-17 MED ORDER — PIVOT 1.5 CAL PO LIQD
1000.0000 mL | ORAL | Status: DC
  Administered 2023-04-17 – 2023-04-23 (×7): 1000 mL
  Filled 2023-04-17 (×4): qty 1000

## 2023-04-17 MED ORDER — SUCCINYLCHOLINE CHLORIDE 20 MG/ML IJ SOLN
INTRAMUSCULAR | Status: AC | PRN
  Administered 2023-04-17: 100 mg via INTRAVENOUS

## 2023-04-17 NOTE — Progress Notes (Signed)
Pharmacy Antibiotic Note  Chad Winters is a 77 y.o. male admitted on 04/17/2023 with AMS/VDRF and sepsis.  Pharmacy has been consulted for Vancomycin and Zosyn  dosing.  Vancomycin 1250 mg IV given in ED at 0310  Plan: Vancomycin 1000 mg IV q48h Zosyn 3.375 g IV q8h   Height: 6' (182.9 cm) Weight: 68 kg (150 lb) IBW/kg (Calculated) : 77.6  Temp (24hrs), Avg:90.9 F (32.7 C), Min:90.7 F (32.6 C), Max:92.7 F (33.7 C)  Recent Labs  Lab 04/17/23 0107 04/17/23 0200  WBC  --  15.2*  CREATININE 2.20* 2.03*    Estimated Creatinine Clearance: 30.2 mL/min (A) (by C-G formula based on SCr of 2.03 mg/dL (H)).    Allergies  Allergen Reactions   Ciprofloxacin Hives and Itching   Codeine Nausea And Vomiting    stiffness   Lisinopril     cough    Eddie Candle 04/17/2023 3:54 AM

## 2023-04-17 NOTE — Procedures (Signed)
Focused Assessment with Sonography in Trauma (FAST)  Pre-procedure diagnosis: hypotension, presumed blunt trauma/fall  Post-procedure diagnosis: same  Procedure: FAST scan  Physician: Marin Olp, MD  Procedure in detail: The patient's abdomen was imaged in 4 regions with the ultrasound. First, the right upper quadrant was imaged. No free fluid was seen between the right kidney and the liver in Morison's pouch. Next, the epigastrium was imaged. No significant pericardial effusion was seen. Next, the left upper quadrant was imaged. No free fluid was seen between the left kidney and the spleen. Finally, the bladder was imaged. No free fluid was seen next to the bladder in the pelvis.  Impression: Negative  Andria Meuse

## 2023-04-17 NOTE — Procedures (Signed)
Central Venous Catheter Insertion Procedure Note  Lennis Caldeira  [id number]  [birth year]  Date:04/17/23  Time:6:10 AM   Provider Performing:Maree Ainley D Suzie Portela   Procedure: Insertion of Non-tunneled Central Venous [phone]) with US guidance ([id number])   Indication(s) Medication administration  Consent Unable to obtain consent due to inability to find a medical decision maker for patient.  All reasonable efforts were made.  Another independent medical provider,   , confirmed the benefits of this procedure outweigh the risks.  Anesthesia Topical only with 1% lidocaine   Timeout Verified patient identification, verified procedure, site/side was marked, verified correct patient position, special equipment/implants available, medications/allergies/relevant history reviewed, required imaging and test results available.  Sterile Technique Maximal sterile technique including full sterile barrier drape, hand hygiene, sterile gown, sterile gloves, mask, hair covering, sterile ultrasound probe cover (if used).  Procedure Description Area of catheter insertion was cleaned with chlorhexidine and draped in sterile fashion.  With real-time ultrasound guidance a central venous catheter was placed into the left femoral vein. Nonpulsatile blood flow and easy flushing noted in all ports.  The catheter was sutured in place and sterile dressing applied.  Complications/Tolerance None; patient tolerated the procedure well. Chest X-ray is ordered to verify placement for internal jugular or subclavian cannulation.   Chest x-ray is not ordered for femoral cannulation.  EBL Minimal  Specimen(s) None  JD Anselm Lis [facility] 04/17/2023, 6:11 AM  Please see [email or web address] for pager details.  From 7A-7P if no response, please call [phone]. After hours, please call ELink [phone].

## 2023-04-17 NOTE — ED Notes (Addendum)
Trauma Response Nurse Documentation   Chad Winters is a 77 y.o. male arriving to [facility] ED via EMS  On No antithrombotic. Trauma was activated as a Level 1 by ED charge RN based on the following trauma criteria Intubated Patients or assisting ventilations.   Patient cleared for CT by Dr. Madilyn Hook EDP. Pt transported to CT with trauma response nurse present to monitor. RN remained with the patient throughout their absence from the department for clinical observation.   GCS 6 ([id number]).  History   History reviewed. No pertinent past medical history.   History reviewed. No pertinent surgical history.     Initial Focused Assessment (If applicable, or please see trauma documentation): Unresponsive male found down outside for unknown amount of time, initial GCS with EMS 11 but declined to 6 by time of arrival, assisting ventilations via BVM. Laceration to the top of his head, scattered skin tears to bilateral arms, legs and back.   Airway patent but unprotected d/t GCS 6, intubated for airway protection during primary assessment, BS clear bilaterally Scattered skin tears to bilateral arms and legs, back with no bleeding on arrival, head lac with no bleeding on arrival, unable to obtain manual BP on arrival GCs 6 ([id number]) on arrival, right pupil blown initially per EMS, bilateral 8mm and nonreactive on my assessment  CT's Completed:   CT Head, CT C-Spine, CT Chest w/ contrast, and CT abdomen/pelvis w/ contrast   Interventions:  IV start and trauma lab draw Blood cultures RSI with etomidate and succinylcholine OG 18Fr Temp foley [place] J collar Fentanyl/propofol for sedation Levophed LR bolus 2 units emergency release blood TDAP ANCEF 2G Zosyn/vancomycin  bicarb iSTAT ABG Rectal temp 92.7 - bair hugger applied CBG FAST - negative Portable chest/pelvis XRAY CT head, c-spine, chest abdomen pelvis Wound care, lac repair by EDP Family contact attempted by Dr. Madilyn Hook EDP,  VM left  Plan for disposition:  Admission to ICU   Consults completed:  Neurosurgeon Thomas paged at [phone], called returned at [facility] - plan to evaluate in the morning CCM paged at [phone], at bedside 0325  Event Summary: Pt presents unresponsive, apneic requiring assisted ventilations via BVM. Unable to auscultate manual blood pressure after two attempts by two staff members - 2 units of emergency release PRBCs given per Dr Cliffton Asters (trauma MD) for hypotension via [place]. Obtained secondary IV access and intubated for airway protection. BP improved with volume. 35fr OG placed. FAST negative. Unable to obtain blood specimens, EDP attempted femoral stick with small blood return. Placed in [place] J and logrolled, rectal temp obtained, warm blankets applied. ANCEF and TDAP given, LR bolus initiated. Given 2 amps bicarb for iSTAT results. XRAYS completed, then escorted pt to CT. Upon return to treatment room, obtained additional blood draw, foley placed and wound care completed. Requiring bilateral soft wrist restraints and fentanyl pushes for sedation, moving all four extremities and attempted to reach for ETT. Order obtained for propofol infusion, requiring several more BP began to fall to 70s, given an additional LR bolus and initiated levophed. CCM/neurosurg consulted. Attempted to reach daughter, VM left.  MTP Summary (If applicable): 2 units given from ED fridge emergency release d/t hypotension  Bedside handoff with ED RN Delorise Shiner.    Chad Winters  Trauma Response RN  Please call TRN at [phone] for further assistance.

## 2023-04-17 NOTE — Progress Notes (Signed)
Pharmacy Antibiotic Note  Chad Winters is a 77 y.o. male admitted on 04/17/2023 with AMS/VDRF and sepsis.  Pharmacy has been consulted for Vancomycin and Zosyn dosing.    Vancomycin 1250 mg IV given in ED at 0310 Zosyn 3.375 IV given in ED at 02:26  WBC trending down to 10.4 Tmax 99.5 SCr trending up to 2.48  Plan: Continue Zosyn 3.375g IV q8h extended infusion    > decrease dose if renal function worsens to CrCl <20 ml/min Decrease Vancomycin dose to 750mg  q48h (eAUC ~521)    > Goal AUC 400-550    > Check vancomycin levels at steady state  Monitor daily CBC, temp, SCr, and for clinical signs of improvement  F/u cultures and de-escalate antibiotics as able  Height: 6' (182.9 cm) Weight: 62.1 kg (136 lb 14.5 oz) IBW/kg (Calculated) : 77.6  Temp (24hrs), Avg:94.1 F (34.5 C), Min:90.7 F (32.6 C), Max:99.5 F (37.5 C)  Recent Labs  Lab 04/17/23 0107 04/17/23 0200 04/17/23 0249 04/17/23 0415 04/17/23 0652 04/17/23 1123  WBC  --  15.2*  --   --   --  10.4  CREATININE 2.20* 2.03*  --   --   --  2.48*  LATICACIDVEN  --   --  >9.0* >9.0* >9.0* >9.0*    Estimated Creatinine Clearance: 22.6 mL/min (A) (by C-G formula based on SCr of 2.48 mg/dL (H)).    Allergies  Allergen Reactions   Ciprofloxacin Hives and Itching   Codeine Nausea And Vomiting    stiffness   Lisinopril     cough    Antimicrobials this admission: Cefazolin x1  5/4 Zosyn 5/4 >>  Vancomycin 5/4 >>   Dose adjustments this admission: 5/4 Vancomycin dose reduced to 750mg  IV q48h (eAUC ~521)  Microbiology results: 5/4 BCx: NGTD < 12h 5/4 UCx: pending  5/4 MRSA PCR: not detected  Thank you for allowing pharmacy to be a part of this patient's care.  Wilburn Cornelia, PharmD, BCPS Clinical Pharmacist 04/17/2023 1:24 PM   Please refer to [facility] for pharmacy phone number

## 2023-04-17 NOTE — Consult Note (Signed)
Activation and Reason: Level 1, ?Fall/found down  Primary Survey:  Airway: Intact without stridor, does not talk Breathing: Bilateral BS Circulation: Faintly palable pulses Disability: GCS 7 ([id number])  HPI: Chad Winters is an 77 y.o. male with unknown medical hx presented to ER via EMS as level 1 trauma. By report he was found down in back yard. Arrived and did have spontaneous movement of all 4 ext including ultimately crossing his legs and was somewhat combative but did not otherwise respond to stimuli, talk or open eyes.   Intubated for depressed GCS and to also facilitate workup  PMH/Social/Family hx is unknown at present and there is no family here for collateral   History reviewed. No pertinent past medical history.   History reviewed. No pertinent family history.  Social:  has no history on file for tobacco use, alcohol use, and drug use.  Allergies:  Allergies  Allergen Reactions   Ciprofloxacin Hives and Itching   Codeine Nausea And Vomiting    stiffness   Lisinopril     cough    Medications: I have reviewed the patient's current medications.  Results for orders placed or performed during the hospital encounter of 04/17/23 (from the past 48 hour(s))  Prepare fresh frozen plasma     Status: None   Collection Time: 04/17/23  1:00 AM  Result Value Ref Range   Unit Number [id number]    Blood Component Type LIQ PLASMA    Unit division 00    Status of Unit REL FROM [facility]    Unit tag comment VERBAL ORDERS PER DR    Transfusion Status OK TO TRANSFUSE    Unit Number [id number]    Blood Component Type LIQ PLASMA    Unit division 00    Status of Unit REL FROM [facility]    Unit tag comment VERBAL ORDERS PER DR    Transfusion Status OK TO TRANSFUSE    Unit Number [id number]    Blood Component Type LIQ PLASMA    Unit division 00    Status of Unit REL FROM [facility]    Unit tag comment VERBAL ORDERS PER DR    Transfusion Status OK TO TRANSFUSE    Unit  Number [id number]    Blood Component Type LIQ PLASMA    Unit division 00    Status of Unit REL FROM [facility]    Unit tag comment VERBAL ORDERS PER DR    Transfusion Status OK TO TRANSFUSE    Unit Number [id number]    Blood Component Type LIQ PLASMA    Unit division 00    Status of Unit REL FROM [facility]    Unit tag comment VERBAL ORDERS PER DR    Transfusion Status OK TO TRANSFUSE    Unit Number [id number]    Blood Component Type LIQ PLASMA    Unit division 00    Status of Unit REL FROM [facility]    Unit tag comment VERBAL ORDERS PER DR    Transfusion Status OK TO TRANSFUSE    Unit Number [id number]    Blood Component Type LIQ PLASMA    Unit division 00    Status of Unit REL FROM [facility]    Unit tag comment VERBAL ORDERS PER DR    Transfusion Status OK TO TRANSFUSE    Unit Number [id number]    Blood Component Type LIQ PLASMA    Unit division 00    Status of Unit REL FROM [facility]    Unit tag comment VERBAL  ORDERS PER DR    Transfusion Status OK TO TRANSFUSE    Unit Number [id number]    Blood Component Type LIQ PLASMA    Unit division 00    Status of Unit REL FROM [facility]    Unit tag comment EMERGENCY RELEASE    Transfusion Status      OK TO TRANSFUSE Performed at [facility], [address] [place], [place] [address]    Unit Number [id number]    Blood Component Type LIQ PLASMA    Unit division 00    Status of Unit REL FROM [facility]    Unit tag comment EMERGENCY RELEASE    Transfusion Status OK TO TRANSFUSE    Unit Number [id number]    Blood Component Type LIQ PLASMA    Unit division 00    Status of Unit REL FROM [facility]    Unit tag comment EMERGENCY RELEASE    Transfusion Status OK TO TRANSFUSE    Unit Number [id number]    Blood Component Type LIQ PLASMA    Unit division 00    Status of Unit REL FROM [facility]    Unit tag comment EMERGENCY RELEASE    Transfusion Status OK TO TRANSFUSE   Prepare platelet pheresis     Status: None   Collection Time:  04/17/23  1:00 AM  Result Value Ref Range   Unit Number [id number]    Blood Component Type PLTP1 PSORALEN TREATED    Unit division 00    Status of Unit REL FROM [facility]    Unit tag comment EMERGENCY RELEASE    Transfusion Status OK TO TRANSFUSE   Prepare cryoprecipitate     Status: None   Collection Time: 04/17/23  1:00 AM  Result Value Ref Range   Unit Number [id number]    Blood Component Type POOL FIBR CMPLX 2D THW    Unit division 00    Status of Unit REL FROM [facility]    Unit tag comment EMERGENCY RELEASE    Transfusion Status OK TO TRANSFUSE   Ethanol     Status: Abnormal   Collection Time: 04/17/23  1:03 AM  Result Value Ref Range   Alcohol, Ethyl (B) 122 (H) <10 mg/dL    Comment: (NOTE) Lowest detectable limit for serum alcohol is 10 mg/dL.  For medical purposes only. Performed at [facility], [address] [place], [place] [address]   CBG monitoring, ED     Status: Abnormal   Collection Time: 04/17/23  1:03 AM  Result Value Ref Range   Glucose-Capillary 102 (H) 70 - 99 mg/dL    Comment: Glucose reference range applies only to samples taken after fasting for at least 8 hours.  I-Stat Chem 8, ED     Status: Abnormal   Collection Time: 04/17/23  1:07 AM  Result Value Ref Range   Sodium 133 (L) 135 - 145 mmol/L   Potassium 5.4 (H) 3.5 - 5.1 mmol/L   Chloride 108 98 - 111 mmol/L   BUN 30 (H) 8 - 23 mg/dL   Creatinine, Ser 0.63 (H) 0.61 - 1.24 mg/dL   Glucose, Bld 87 70 - 99 mg/dL    Comment: Glucose reference range applies only to samples taken after fasting for at least 8 hours.   Calcium, Ion 0.78 (LL) 1.15 - 1.40 mmol/L   TCO2 8 (L) 22 - 32 mmol/L   Hemoglobin 10.2 (L) 13.0 - 17.0 g/dL   HCT 01.6 (L) 01.0 - 93.2 %  Comment NOTIFIED PHYSICIAN   I-Stat venous blood gas, ED     Status: Abnormal   Collection Time: 04/17/23  1:07 AM  Result Value Ref Range   pH, Ven 6.721 (LL) 7.25 - 7.43   pCO2, Ven 30.8 (L) 44 - 60 mmHg   pO2, Ven 87 (H) 32 - 45 mmHg    Bicarbonate 4.0 (L) 20.0 - 28.0 mmol/L   TCO2 <5 (L) 22 - 32 mmol/L   O2 Saturation 80 %   Acid-base deficit 30.0 (H) 0.0 - 2.0 mmol/L   Sodium 132 (L) 135 - 145 mmol/L   Potassium 5.4 (H) 3.5 - 5.1 mmol/L   Calcium, Ion 0.80 (LL) 1.15 - 1.40 mmol/L   HCT 30.0 (L) 39.0 - 52.0 %   Hemoglobin 10.2 (L) 13.0 - 17.0 g/dL   Sample type VENOUS    Comment NOTIFIED PHYSICIAN   Comprehensive metabolic panel     Status: Abnormal   Collection Time: 04/17/23  2:00 AM  Result Value Ref Range   Sodium 134 (L) 135 - 145 mmol/L   Potassium 3.3 (L) 3.5 - 5.1 mmol/L   Chloride 98 98 - 111 mmol/L   CO2 <7 (L) 22 - 32 mmol/L   Glucose, Bld 83 70 - 99 mg/dL    Comment: Glucose reference range applies only to samples taken after fasting for at least 8 hours.   BUN 28 (H) 8 - 23 mg/dL   Creatinine, Ser 1.61 (H) 0.61 - 1.24 mg/dL   Calcium 7.4 (L) 8.9 - 10.3 mg/dL   Total Protein 4.7 (L) 6.5 - 8.1 g/dL   Albumin 1.8 (L) 3.5 - 5.0 g/dL   AST 096 (H) 15 - 41 U/L   ALT 350 (H) 0 - 44 U/L   Alkaline Phosphatase 111 38 - 126 U/L   Total Bilirubin 0.4 0.3 - 1.2 mg/dL   GFR, Estimated 34 (L) >60 mL/min    Comment: (NOTE) Calculated using the CKD-EPI Creatinine Equation (2021)    Anion gap NOT CALCULATED 5 - 15    Comment: Performed at [facility], [address] [place], [place] [address]  CBC     Status: Abnormal   Collection Time: 04/17/23  2:00 AM  Result Value Ref Range   WBC 15.2 (H) 4.0 - 10.5 K/uL   RBC 3.17 (L) 4.22 - 5.81 MIL/uL   Hemoglobin 9.6 (L) 13.0 - 17.0 g/dL   HCT 98.1 (L) 19.1 - 47.8 %   MCV 98.1 80.0 - 100.0 fL   MCH 30.3 26.0 - 34.0 pg   MCHC 30.9 30.0 - 36.0 g/dL   RDW 29.5 (H) 62.1 - 30.8 %   Platelets 189 150 - 400 K/uL   nRBC 0.0 0.0 - 0.2 %    Comment: Performed at [facility], [address], [place], [place] [address]  Urinalysis, Routine w reflex microscopic -Urine, Clean Catch     Status: Abnormal   Collection Time: 04/17/23  2:00 AM  Result Value Ref  Range   Color, Urine YELLOW YELLOW   APPearance CLEAR CLEAR   Specific Gravity, Urine 1.011 1.005 - 1.030   pH 6.0 5.0 - 8.0   Glucose, UA NEGATIVE NEGATIVE mg/dL   Hgb urine dipstick NEGATIVE NEGATIVE   Bilirubin Urine NEGATIVE NEGATIVE   Ketones, ur NEGATIVE NEGATIVE mg/dL   Protein, ur >=696 (A) NEGATIVE mg/dL   Nitrite NEGATIVE NEGATIVE   Leukocytes,Ua NEGATIVE NEGATIVE   RBC / HPF 0-5 0 - 5  RBC/hpf   WBC, UA 0-5 0 - 5 WBC/hpf   Bacteria, UA RARE (A) NONE SEEN   Squamous Epithelial / HPF 0-5 0 - 5 /HPF   Mucus PRESENT     Comment: Performed at [facility], [address], [place], [place] [address]  Rapid urine drug screen (hospital performed)     Status: None   Collection Time: 04/17/23  2:00 AM  Result Value Ref Range   Opiates NONE DETECTED NONE DETECTED   Cocaine NONE DETECTED NONE DETECTED   Benzodiazepines NONE DETECTED NONE DETECTED   Amphetamines NONE DETECTED NONE DETECTED   Tetrahydrocannabinol NONE DETECTED NONE DETECTED   Barbiturates NONE DETECTED NONE DETECTED    Comment: (NOTE) DRUG SCREEN FOR MEDICAL PURPOSES ONLY.  IF CONFIRMATION IS NEEDED FOR ANY PURPOSE, NOTIFY LAB WITHIN 5 DAYS.  LOWEST DETECTABLE LIMITS FOR URINE DRUG SCREEN Drug Class                     Cutoff (ng/mL) Amphetamine and metabolites    1000 Barbiturate and metabolites    200 Benzodiazepine                 200 Opiates and metabolites        300 Cocaine and metabolites        300 THC                            50 Performed at [facility], [address], [place], [place] [address]   ABO/Rh     Status: None   Collection Time: 04/17/23  2:00 AM  Result Value Ref Range   ABO/RH(D)      A POS Performed at [facility], [address] [place], [place] [address]   Type and screen Ordered by PROVIDER DEFAULT     Status: None (Preliminary result)   Collection Time: 04/17/23  2:43 AM  Result Value Ref Range   ABO/RH(D) A POS    Antibody Screen NEG    Sample  Expiration 04/20/2023,2359    Unit Number [id number]    Blood Component Type RED CELLS,LR    Unit division 00    Status of Unit REL FROM [facility]    Unit tag comment VERBAL ORDERS PER DR    Transfusion Status OK TO TRANSFUSE    Crossmatch Result NOT NEEDED    Unit Number [id number]    Blood Component Type RED CELLS,LR    Unit division 00    Status of Unit REL FROM [facility]    Unit tag comment VERBAL ORDERS PER DR    Transfusion Status OK TO TRANSFUSE    Crossmatch Result NOT NEEDED    Unit Number [id number]    Blood Component Type RED CELLS,LR    Unit division 00    Status of Unit REL FROM [facility]    Unit tag comment VERBAL ORDERS PER DR    Transfusion Status OK TO TRANSFUSE    Crossmatch Result NOT NEEDED    Unit Number [id number]    Blood Component Type RED CELLS,LR    Unit division 00    Status of Unit REL FROM [facility]    Unit tag comment VERBAL ORDERS PER DR    Transfusion Status OK TO TRANSFUSE    Crossmatch Result NOT NEEDED    Unit Number [id number]    Blood Component Type RED CELLS,LR    Unit division 00  Status of Unit REL FROM [facility]    Unit tag comment VERBAL ORDERS PER DR    Transfusion Status OK TO TRANSFUSE    Crossmatch Result NOT NEEDED    Unit Number [id number]    Blood Component Type RED CELLS,LR    Unit division 00    Status of Unit REL FROM [facility]    Unit tag comment VERBAL ORDERS PER DR    Transfusion Status OK TO TRANSFUSE    Crossmatch Result NOT NEEDED    Unit Number [id number]    Blood Component Type RED CELLS,LR    Unit division 00    Status of Unit REL FROM [facility]    Unit tag comment VERBAL ORDERS PER DR    Transfusion Status OK TO TRANSFUSE    Crossmatch Result NOT NEEDED    Unit Number [id number]    Blood Component Type RED CELLS,LR    Unit division 00    Status of Unit REL FROM [facility]    Unit tag comment VERBAL ORDERS PER DR    Transfusion Status OK TO TRANSFUSE    Crossmatch Result NOT NEEDED    Unit Number  [id number]    Blood Component Type RED CELLS,LR    Unit division 00    Status of Unit ISSUED    Transfusion Status OK TO TRANSFUSE    Crossmatch Result COMPATIBLE    Unit Number [id number]    Blood Component Type RBC LR PHER1    Unit division 00    Status of Unit REL FROM [facility]    Unit tag comment EMERGENCY RELEASE    Transfusion Status OK TO TRANSFUSE    Crossmatch Result NOT NEEDED    Unit Number [id number]    Blood Component Type RED CELLS,LR    Unit division 00    Status of Unit REL FROM [facility]    Unit tag comment EMERGENCY RELEASE    Transfusion Status OK TO TRANSFUSE    Crossmatch Result NOT NEEDED    Unit Number [id number]    Blood Component Type RBC LR PHER2    Unit division 00    Status of Unit REL FROM [facility]    Unit tag comment EMERGENCY RELEASE    Transfusion Status OK TO TRANSFUSE    Crossmatch Result NOT NEEDED    Unit Number [id number]    Blood Component Type RED CELLS,LR    Unit division 00    Status of Unit REL FROM [facility]    Unit tag comment EMERGENCY RELEASE    Transfusion Status OK TO TRANSFUSE    Crossmatch Result NOT NEEDED    Unit Number [id number]    Blood Component Type RED CELLS,LR    Unit division 00    Status of Unit ISSUED    Transfusion Status OK TO TRANSFUSE    Crossmatch Result COMPATIBLE   Lactic acid, plasma     Status: Abnormal   Collection Time: 04/17/23  2:49 AM  Result Value Ref Range   Lactic Acid, Venous >9.0 (HH) 0.5 - 1.9 mmol/L    Comment: CRITICAL RESULT CALLED TO, READ BACK BY AND VERIFIED WITH G. TATE, RN AT 0410 ON 04/17/23 BY H.HOWARD. Performed at [facility], [address] [place], [place] [address]   I-Stat arterial blood gas, ED     Status: Abnormal   Collection Time: 04/17/23  2:50 AM  Result Value Ref Range   pH, Arterial 6.943 (LL) 7.35 - 7.45   pCO2 arterial 20.0 (L) 32 -  48 mmHg   pO2, Arterial 571 (H) 83 - 108 mmHg   Bicarbonate 4.7 (L) 20.0 - 28.0 mmol/L   TCO2 5 (L) 22 - 32 mmol/L    O2 Saturation 100 %   Acid-base deficit 27.0 (H) 0.0 - 2.0 mmol/L   Sodium 133 (L) 135 - 145 mmol/L   Potassium 3.6 3.5 - 5.1 mmol/L   Calcium, Ion 0.96 (L) 1.15 - 1.40 mmol/L   HCT 27.0 (L) 39.0 - 52.0 %   Hemoglobin 9.2 (L) 13.0 - 17.0 g/dL   Patient temperature 40.9 F    Collection site RADIAL, ALLEN'S TEST ACCEPTABLE    Drawn by Operator    Sample type ARTERIAL    Comment NOTIFIED PHYSICIAN   Prepare RBC     Status: None   Collection Time: 04/17/23  3:03 AM  Result Value Ref Range   Order Confirmation      BB SAMPLE OR UNITS ALREADY AVAILABLE Performed at [facility], [address] [place], [place] [address]     CT CHEST ABDOMEN PELVIS W CONTRAST  Addendum Date: 04/17/2023   ADDENDUM REPORT: 04/17/2023 02:55 ADDENDUM: Close clinical follow-up and monitoring of renal function is recommended. Electronically Signed   By: Almira Bar M.D.   On: 04/17/2023 02:55   Result Date: 04/17/2023 CLINICAL DATA:  Fall injury with blunt polytrauma. EXAM: CT CHEST, ABDOMEN, AND PELVIS WITH CONTRAST TECHNIQUE: Multidetector CT imaging of the chest, abdomen and pelvis was performed following the standard protocol during bolus administration of intravenous contrast. RADIATION DOSE REDUCTION: This exam was performed according to the departmental dose-optimization program which includes automated exposure control, adjustment of the mA and/or kV according to patient size and/or use of iterative reconstruction technique. CONTRAST:  75mL OMNIPAQUE IOHEXOL 350 MG/ML SOLN COMPARISON:  CT chest, abdomen and pelvis with contrast 09/21/2022, and CTA chest, abdomen and pelvis 06/27/2022. Both of these were performed at [facility] and therefore reports for these studies are unavailable. FINDINGS: CT CHEST FINDINGS Cardiovascular: The cardiac size is stable, slightly enlarged, with again noted apical myocardial thinning and fatty infiltration consistent with a chronic apical infarct.  There are similar chronic changes to the left lateral ventricular wall as well. The pulmonary veins are decompressed. There are moderate to heavy three-vessel coronary artery calcifications. The pulmonary arteries are normal in caliber and are centrally clear on this nondedicated exam. Thoracic aorta is tortuous, with scattered calcification along the valve leaflets. There is moderate to heavy calcific plaque in the aorta and great vessels but no appreciable great vessel stenosis greater than 50%. There is a patent normal variant origin of left vertebral artery from the aortic arch. There is no aortic aneurysm, stenosis or dissection. Mediastinum/Nodes: ETT is in place with tip 2.4 cm from the carina. The trachea is clear. The main bronchi are clear. NGT is in place entering the stomach and directed to the left with the tip abutting the wall of the proximal fundus. There is retained fluid in the mid to distal esophagus versus reflux. No wall thickening. There is heterogeneous thyroid without a visible focal nodule. No axillary or intrathoracic adenopathy is seen. Lungs/Pleura: There is a small low-density layering left pleural effusion. There is no right pleural effusion. There is no pneumothorax. There is mild posterior atelectasis in the lung bases. Scattered chronic linear scar-like opacities in the lower lobes. There is a calcified granuloma in the right middle lobe laterally. No noncalcified nodules, contusions or infiltrates are seen. Musculoskeletal:  There are mild acute anterior wedge compression fractures of the C7 and T1 vertebral bodies. No spinal canal hematoma is seen. There is mild wedging of the T3 vertebral body which was noted previously and is unchanged. There are multilevel thoracic spine bridging syndesmophytes, contiguously from down to T12 where there is a again noted a mild chronic anterior wedge compression deformity. There are subacute partially healed fractures of the right posterior tenth  and eleventh ribs and chronic healed fracture deformities of the right posterolateral sixth and seventh ribs. On the left, there are partially healed subacute fractures of the posterior second rib, of the posterior eleventh and twelfth ribs and of the posterolateral tenth rib, with chronic healed overriding fracture deformity of the anterior second rib. No acute displaced rib fracture is suspected. CT ABDOMEN PELVIS FINDINGS Hepatobiliary: In the lower medial aspect of hepatic segment 5, there is an ovoid hypodense lesion again noted, larger than on 09/21/2022 and was not seen on 06/27/2022. This currently measures 2.7 x 1.6 cm with a Hounsfield density of 37 and previously measured 2.3 x 1.3 cm. There is a 5 mm stable too small to characterize hypodensity anteriorly in segment 3 on 3:59. MRI without and with contrast is recommended for further evaluation. Remaining liver is homogeneous with no evidence of an acute injury or perihepatic hematoma. Gallbladder is absent and there is no biliary dilatation. Pancreas: The head and neck of pancreas have been previously removed and there are surgical clips in the area. There is either a pancreaticojejunostomy or pancreaticoduodenostomy anterior to the pancreatic resection. There is slight edema around the body and tail of the pancreas concerning for acute interstitial pancreatitis. There is increased pancreatic ductal prominence now measuring 4 mm. No mass is seen. Spleen: No mass enhancement or acute injury are suspected. Adrenals/Urinary Tract: There is no adrenal mass. Both kidneys are abnormal compared to prior studies with bilateral diffuse patchy hypoenhancement which could be due to multifocal infarctions, contrast nephrotoxicity or an infectious process. Multiple tiny too small to characterize hypodensities were previously noted in the kidneys but are obscured in the areas of hypoenhancement today. There is no urinary stone or obstruction. The posterior half of the  bladder is obscured by spray artifact from bilateral hip replacements. The anterior bladder is mildly thickened which was seen on both of the prior studies and could be due to hypertrophy or cystitis. 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 Thickened folds are again noted in the left abdominal small bowel which were noted previously. There are mildly dilated mid abdominal small bowel segments up to 3.2 cm without visible transition and could be due to ileus or low-grade obstruction. Rest of the small bowel is normal caliber. An appendix is not seen in this patient. There is diverticulosis without evidence of diverticulitis. Vascular/Lymphatic: There is extensive heavy aortic, iliac and branch vessel atherosclerosis including both renal arteries. No AAA is seen. The pelvic sidewalls are obscured by the hip replacements, otherwise, there is no visible adenopathy. Reproductive: The prostate is obscured by the hip replacements but it was previously not enlarged. Other: There is minimal ascites in the abdomen. No ascites in the pelvis. There are mild mesenteric congestive changes. Musculoskeletal: Osteopenia. No lumbar compression fracture. No regional skeletal fracture is seen. Right hip replacement is new from 09/21/2022. IMPRESSION: 1. Mild acute anterior wedge compression fractures of the C7 and T1 vertebral bodies. 2. No other acute trauma related findings in the chest, abdomen or pelvis. 3.  Small left pleural effusion. 4. 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 5. Aortic and coronary artery atherosclerosis. 6. Retained fluid in the mid to distal esophagus versus reflux. 7. Prior Whipple procedure with slight edema around the body and tail of the pancreas concerning for acute interstitial pancreatitis, with increased prominence of the pancreatic duct since the last CT. 8. Interval development of diffuse patchy hypoenhancement of both kidneys which  could be due to multifocal infarctions, contrast nephrotoxicity or an infectious process. 9. Increased size of a homogeneous low-density lesion in the lower medial aspect of segment 5 of the liver. MRI without and with contrast is recommended. 10. Thickened folds in the left abdominal small bowel which could be due to enteritis, with mildly dilated mid abdominal small bowel segments up to 3.2 cm without visible transition. This could be due to ileus or low-grade obstruction. 11. Minimal ascites and mild mesenteric edema. 12. Osteopenia and degenerative change. 13. Critical Value/emergent results were called by telephone at the time of interpretation on 04/17/2023 at 1:51 am to provider Dr. Cliffton Asters, who verbally acknowledged these results. Electronically Signed: By: Almira Bar M.D. On: 04/17/2023 02:33   CT HEAD WO CONTRAST  Result Date: 04/17/2023 CLINICAL DATA:  Trauma EXAM: CT HEAD WITHOUT CONTRAST CT CERVICAL SPINE WITHOUT CONTRAST TECHNIQUE: Multidetector CT imaging of the head and cervical spine was performed following the standard protocol without intravenous contrast. Multiplanar CT image reconstructions of the cervical spine were also generated. RADIATION DOSE REDUCTION: This exam was performed according to the departmental dose-optimization program which includes automated exposure control, adjustment of the mA and/or kV according to patient size and/or use of iterative reconstruction technique. COMPARISON:  03/04/2022 CT head and cervical spine, CT chest 09/21/2022 FINDINGS: CT HEAD FINDINGS Brain: Hypodense collection overlying the left frontoparietal convexity measures up to 1.2 cm (series 5, image 49). Interval decrease in the size of the previously noted right frontal convexity collection, which measures up to 0.5 cm, previously 0.8 cm when remeasured similarly. No evidence of acute infarct, hemorrhage, mass, mass effect, or midline shift. No hydrocephalus. Small focus of encephalomalacia in the  right frontal lobe is unchanged. Vascular: No hyperdense vessel. Atherosclerotic calcifications in the intracranial carotid and vertebral arteries. Skull: Negative for fracture or focal lesion. Biparietal and bifrontal burr holes. Sinuses/Orbits: No acute finding. Status post bilateral lens replacements. Other: The mastoid air cells are well aerated. CT CERVICAL SPINE FINDINGS Alignment: No traumatic listhesis. Skull base and vertebrae: Acute fractures of C7 and T1, with 15% vertebral body height loss at C7 and 20% vertebral body height loss at T1. The T1 fracture appears to extend from the anterior through the posterior cortex, without significant retropulsion. The C7 fracture is more anterior than posterior. Soft tissues and spinal canal: No significant prevertebral fluid or swelling. No visible canal hematoma. Disc levels: Degenerative changes in the cervical spine. No significant spinal canal stenosis. Upper chest: For findings in the thorax, please see same day CT chest. IMPRESSION: 1. Acute fractures of C7 and T1, with 15% vertebral body height loss at C7 and 20% vertebral body height loss at T1. The T1 fracture appears to extend from the anterior through the posterior cortex, without significant retropulsion. 2. 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 3. 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 4. No additional acute intracranial process. Imaging  results were communicated on 04/17/2023 at 1:52 am to provider Airrion Otting via secure text paging. Electronically Signed   By: Wiliam Ke M.D.   On: 04/17/2023 01:53   CT CERVICAL SPINE WO CONTRAST  Result Date: 04/17/2023 CLINICAL DATA:  Trauma EXAM: CT HEAD WITHOUT CONTRAST CT CERVICAL SPINE WITHOUT CONTRAST TECHNIQUE: Multidetector CT imaging of the head and cervical  spine was performed following the standard protocol without intravenous contrast. Multiplanar CT image reconstructions of the cervical spine were also generated. RADIATION DOSE REDUCTION: This exam was performed according to the departmental dose-optimization program which includes automated exposure control, adjustment of the mA and/or kV according to patient size and/or use of iterative reconstruction technique. COMPARISON:  03/04/2022 CT head and cervical spine, CT chest 09/21/2022 FINDINGS: CT HEAD FINDINGS Brain: Hypodense collection overlying the left frontoparietal convexity measures up to 1.2 cm (series 5, image 49). Interval decrease in the size of the previously noted right frontal convexity collection, which measures up to 0.5 cm, previously 0.8 cm when remeasured similarly. No evidence of acute infarct, hemorrhage, mass, mass effect, or midline shift. No hydrocephalus. Small focus of encephalomalacia in the right frontal lobe is unchanged. Vascular: No hyperdense vessel. Atherosclerotic calcifications in the intracranial carotid and vertebral arteries. Skull: Negative for fracture or focal lesion. Biparietal and bifrontal burr holes. Sinuses/Orbits: No acute finding. Status post bilateral lens replacements. Other: The mastoid air cells are well aerated. CT CERVICAL SPINE FINDINGS Alignment: No traumatic listhesis. Skull base and vertebrae: Acute fractures of C7 and T1, with 15% vertebral body height loss at C7 and 20% vertebral body height loss at T1. The T1 fracture appears to extend from the anterior through the posterior cortex, without significant retropulsion. The C7 fracture is more anterior than posterior. Soft tissues and spinal canal: No significant prevertebral fluid or swelling. No visible canal hematoma. Disc levels: Degenerative changes in the cervical spine. No significant spinal canal stenosis. Upper chest: For findings in the thorax, please see same day CT chest. IMPRESSION: 1. Acute  fractures of C7 and T1, with 15% vertebral body height loss at C7 and 20% vertebral body height loss at T1. The T1 fracture appears to extend from the anterior through the posterior cortex, without significant retropulsion. 2. 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 3. 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 4. No additional acute intracranial process. Imaging results were communicated on 04/17/2023 at 1:52 am to provider Sameer Teeple via secure text paging. Electronically Signed   By: Wiliam Ke M.D.   On: 04/17/2023 01:53   DG Chest Port 1 View  Result Date: 04/17/2023 CLINICAL DATA:  Fall, chest trauma EXAM: PORTABLE CHEST 1 VIEW COMPARISON:  None Available. FINDINGS: Left costophrenic angle excluded from view. Endotracheal tube 3.2 cm above the carina. Nasogastric tube tip within the gastric fundus. Visualized lungs are clear. No pneumothorax. No pleural effusion. Cardiac size within normal limits. Pulmonary vascularity is normal. Healed right rib fractures noted. No acute bone abnormality. IMPRESSION: 1. Endotracheal tube 3.2 cm above the carina. Nasogastric tube tip within the gastric fundus. Electronically Signed   By: Helyn Numbers M.D.   On: 04/17/2023 01:25   DG Pelvis Portable  Result Date: 04/17/2023 CLINICAL DATA:  Fall, pelvic trauma EXAM: PORTABLE PELVIS 1-2 VIEWS COMPARISON:  None Available. 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 No acute fracture  or dislocation. Bilateral common iliac artery stents noted. Vascular calcifications noted. IMPRESSION: 1. Bilateral total hip arthroplasty. No acute fracture or dislocation. Electronically Signed   By: Helyn Numbers M.D.   On: 04/17/2023 01:23    ROS -Unable to obtain due to condition of patient  PE Blood pressure (!) 94/50, pulse  72, temperature (!) 91.4 F (33 C), resp. rate 19, height 6' (1.829 m), weight 68 kg, SpO2 100 %. Physical Exam Constitutional: somewhat combative, unresponsive, GCS 7; ecchymoses and wounds in varios stages of healing; no obviousy deformities Eyes: Moist conjunctiva; anicteric; pupils sluggish Neck: Trachea midline Lungs: Normal respiratory effort; CTAB CV: RRR; no pitting edema GI: Abd soft, nondistended MSK: Moving all 4 ext;  no deformities Psychiatric: unable to assess  Results for orders placed or performed during the hospital encounter of 04/17/23 (from the past 48 hour(s))  Prepare fresh frozen plasma     Status: None   Collection Time: 04/17/23  1:00 AM  Result Value Ref Range   Unit Number [id number]    Blood Component Type LIQ PLASMA    Unit division 00    Status of Unit REL FROM [facility]    Unit tag comment VERBAL ORDERS PER DR    Transfusion Status OK TO TRANSFUSE    Unit Number [id number]    Blood Component Type LIQ PLASMA    Unit division 00    Status of Unit REL FROM [facility]    Unit tag comment VERBAL ORDERS PER DR    Transfusion Status OK TO TRANSFUSE    Unit Number [id number]    Blood Component Type LIQ PLASMA    Unit division 00    Status of Unit REL FROM [facility]    Unit tag comment VERBAL ORDERS PER DR    Transfusion Status OK TO TRANSFUSE    Unit Number [id number]    Blood Component Type LIQ PLASMA    Unit division 00    Status of Unit REL FROM [facility]    Unit tag comment VERBAL ORDERS PER DR    Transfusion Status OK TO TRANSFUSE    Unit Number [id number]    Blood Component Type LIQ PLASMA    Unit division 00    Status of Unit REL FROM [facility]    Unit tag comment VERBAL ORDERS PER DR    Transfusion Status OK TO TRANSFUSE    Unit Number [id number]    Blood Component Type LIQ PLASMA    Unit division 00    Status of Unit REL FROM [facility]    Unit tag comment VERBAL ORDERS PER DR    Transfusion Status OK TO TRANSFUSE    Unit Number  [id number]    Blood Component Type LIQ PLASMA    Unit division 00    Status of Unit REL FROM [facility]    Unit tag comment VERBAL ORDERS PER DR    Transfusion Status OK TO TRANSFUSE    Unit Number [id number]    Blood Component Type LIQ PLASMA    Unit division 00    Status of Unit REL FROM [facility]    Unit tag comment VERBAL ORDERS PER DR    Transfusion Status OK TO TRANSFUSE    Unit Number [id number]    Blood Component Type LIQ PLASMA    Unit division 00    Status of Unit REL FROM [facility]    Unit tag comment EMERGENCY RELEASE    Transfusion Status      OK TO TRANSFUSE Performed at  [facility], [address] [place], [place] [address]    Unit Number [id number]    Blood Component Type LIQ PLASMA    Unit division 00    Status of Unit REL FROM [facility]    Unit tag comment EMERGENCY RELEASE    Transfusion Status OK TO TRANSFUSE    Unit Number [id number]    Blood Component Type LIQ PLASMA    Unit division 00    Status of Unit REL FROM [facility]    Unit tag comment EMERGENCY RELEASE    Transfusion Status OK TO TRANSFUSE    Unit Number [id number]    Blood Component Type LIQ PLASMA    Unit division 00    Status of Unit REL FROM [facility]    Unit tag comment EMERGENCY RELEASE    Transfusion Status OK TO TRANSFUSE   Prepare platelet pheresis     Status: None   Collection Time: 04/17/23  1:00 AM  Result Value Ref Range   Unit Number [id number]    Blood Component Type PLTP1 PSORALEN TREATED    Unit division 00    Status of Unit REL FROM [facility]    Unit tag comment EMERGENCY RELEASE    Transfusion Status OK TO TRANSFUSE   Prepare cryoprecipitate     Status: None   Collection Time: 04/17/23  1:00 AM  Result Value Ref Range   Unit Number [id number]    Blood Component Type POOL FIBR CMPLX 2D THW    Unit division 00    Status of Unit REL FROM [facility]    Unit tag comment EMERGENCY RELEASE    Transfusion Status OK TO TRANSFUSE   Ethanol     Status: Abnormal   Collection  Time: 04/17/23  1:03 AM  Result Value Ref Range   Alcohol, Ethyl (B) 122 (H) <10 mg/dL    Comment: (NOTE) Lowest detectable limit for serum alcohol is 10 mg/dL.  For medical purposes only. Performed at [facility], [address] [place], [place] [address]   CBG monitoring, ED     Status: Abnormal   Collection Time: 04/17/23  1:03 AM  Result Value Ref Range   Glucose-Capillary 102 (H) 70 - 99 mg/dL    Comment: Glucose reference range applies only to samples taken after fasting for at least 8 hours.  I-Stat Chem 8, ED     Status: Abnormal   Collection Time: 04/17/23  1:07 AM  Result Value Ref Range   Sodium 133 (L) 135 - 145 mmol/L   Potassium 5.4 (H) 3.5 - 5.1 mmol/L   Chloride 108 98 - 111 mmol/L   BUN 30 (H) 8 - 23 mg/dL   Creatinine, Ser 4.33 (H) 0.61 - 1.24 mg/dL   Glucose, Bld 87 70 - 99 mg/dL    Comment: Glucose reference range applies only to samples taken after fasting for at least 8 hours.   Calcium, Ion 0.78 (LL) 1.15 - 1.40 mmol/L   TCO2 8 (L) 22 - 32 mmol/L   Hemoglobin 10.2 (L) 13.0 - 17.0 g/dL   HCT 29.5 (L) 18.8 - 41.6 %   Comment NOTIFIED PHYSICIAN   I-Stat venous blood gas, ED     Status: Abnormal   Collection Time: 04/17/23  1:07 AM  Result Value Ref Range   pH, Ven 6.721 (LL) 7.25 - 7.43   pCO2, Ven 30.8 (L) 44 - 60 mmHg   pO2, Ven 87 (H) 32 - 45 mmHg   Bicarbonate 4.0 (L)  20.0 - 28.0 mmol/L   TCO2 <5 (L) 22 - 32 mmol/L   O2 Saturation 80 %   Acid-base deficit 30.0 (H) 0.0 - 2.0 mmol/L   Sodium 132 (L) 135 - 145 mmol/L   Potassium 5.4 (H) 3.5 - 5.1 mmol/L   Calcium, Ion 0.80 (LL) 1.15 - 1.40 mmol/L   HCT 30.0 (L) 39.0 - 52.0 %   Hemoglobin 10.2 (L) 13.0 - 17.0 g/dL   Sample type VENOUS    Comment NOTIFIED PHYSICIAN   Comprehensive metabolic panel     Status: Abnormal   Collection Time: 04/17/23  2:00 AM  Result Value Ref Range   Sodium 134 (L) 135 - 145 mmol/L   Potassium 3.3 (L) 3.5 - 5.1 mmol/L   Chloride 98 98 - 111 mmol/L   CO2 <7  (L) 22 - 32 mmol/L   Glucose, Bld 83 70 - 99 mg/dL    Comment: Glucose reference range applies only to samples taken after fasting for at least 8 hours.   BUN 28 (H) 8 - 23 mg/dL   Creatinine, Ser 0.45 (H) 0.61 - 1.24 mg/dL   Calcium 7.4 (L) 8.9 - 10.3 mg/dL   Total Protein 4.7 (L) 6.5 - 8.1 g/dL   Albumin 1.8 (L) 3.5 - 5.0 g/dL   AST 409 (H) 15 - 41 U/L   ALT 350 (H) 0 - 44 U/L   Alkaline Phosphatase 111 38 - 126 U/L   Total Bilirubin 0.4 0.3 - 1.2 mg/dL   GFR, Estimated 34 (L) >60 mL/min    Comment: (NOTE) Calculated using the CKD-EPI Creatinine Equation (2021)    Anion gap NOT CALCULATED 5 - 15    Comment: Performed at [facility], [address] [place], [place] [address]  CBC     Status: Abnormal   Collection Time: 04/17/23  2:00 AM  Result Value Ref Range   WBC 15.2 (H) 4.0 - 10.5 K/uL   RBC 3.17 (L) 4.22 - 5.81 MIL/uL   Hemoglobin 9.6 (L) 13.0 - 17.0 g/dL   HCT 47.8 (L) 29.5 - 62.1 %   MCV 98.1 80.0 - 100.0 fL   MCH 30.3 26.0 - 34.0 pg   MCHC 30.9 30.0 - 36.0 g/dL   RDW 30.8 (H) 65.7 - 84.6 %   Platelets 189 150 - 400 K/uL   nRBC 0.0 0.0 - 0.2 %    Comment: Performed at [facility], [address], [place], [place] [address]  Urinalysis, Routine w reflex microscopic -Urine, Clean Catch     Status: Abnormal   Collection Time: 04/17/23  2:00 AM  Result Value Ref Range   Color, Urine YELLOW YELLOW   APPearance CLEAR CLEAR   Specific Gravity, Urine 1.011 1.005 - 1.030   pH 6.0 5.0 - 8.0   Glucose, UA NEGATIVE NEGATIVE mg/dL   Hgb urine dipstick NEGATIVE NEGATIVE   Bilirubin Urine NEGATIVE NEGATIVE   Ketones, ur NEGATIVE NEGATIVE mg/dL   Protein, ur >=284 (A) NEGATIVE mg/dL   Nitrite NEGATIVE NEGATIVE   Leukocytes,Ua NEGATIVE NEGATIVE   RBC / HPF 0-5 0 - 5 RBC/hpf   WBC, UA 0-5 0 - 5 WBC/hpf   Bacteria, UA RARE (A) NONE SEEN   Squamous Epithelial / HPF 0-5 0 - 5 /HPF   Mucus PRESENT     Comment: Performed at [facility], [address]  [place], [place] [address]  Rapid urine drug screen (hospital performed)     Status:  None   Collection Time: 04/17/23  2:00 AM  Result Value Ref Range   Opiates NONE DETECTED NONE DETECTED   Cocaine NONE DETECTED NONE DETECTED   Benzodiazepines NONE DETECTED NONE DETECTED   Amphetamines NONE DETECTED NONE DETECTED   Tetrahydrocannabinol NONE DETECTED NONE DETECTED   Barbiturates NONE DETECTED NONE DETECTED    Comment: (NOTE) DRUG SCREEN FOR MEDICAL PURPOSES ONLY.  IF CONFIRMATION IS NEEDED FOR ANY PURPOSE, NOTIFY LAB WITHIN 5 DAYS.  LOWEST DETECTABLE LIMITS FOR URINE DRUG SCREEN Drug Class                     Cutoff (ng/mL) Amphetamine and metabolites    1000 Barbiturate and metabolites    200 Benzodiazepine                 200 Opiates and metabolites        300 Cocaine and metabolites        300 THC                            50 Performed at [facility], [address], [place], [place] [address]   ABO/Rh     Status: None   Collection Time: 04/17/23  2:00 AM  Result Value Ref Range   ABO/RH(D)      A POS Performed at [facility], [address] [place], [place] [address]   Type and screen Ordered by PROVIDER DEFAULT     Status: None (Preliminary result)   Collection Time: 04/17/23  2:43 AM  Result Value Ref Range   ABO/RH(D) A POS    Antibody Screen NEG    Sample Expiration 04/20/2023,2359    Unit Number [id number]    Blood Component Type RED CELLS,LR    Unit division 00    Status of Unit REL FROM [facility]    Unit tag comment VERBAL ORDERS PER DR    Transfusion Status OK TO TRANSFUSE    Crossmatch Result NOT NEEDED    Unit Number [id number]    Blood Component Type RED CELLS,LR    Unit division 00    Status of Unit REL FROM [facility]    Unit tag comment VERBAL ORDERS PER DR    Transfusion Status OK TO TRANSFUSE    Crossmatch Result NOT NEEDED    Unit Number [id number]    Blood Component Type RED CELLS,LR    Unit division 00    Status of Unit REL  FROM [facility] [place] [facility]    Unit tag comment VERBAL ORDERS PER DR    Transfusion Status OK TO TRANSFUSE    Crossmatch Result NOT NEEDED    Unit Number [id number]    Blood Component Type RED CELLS,LR    Unit division 00    Status of Unit REL FROM [facility]    Unit tag comment VERBAL ORDERS PER DR    Transfusion Status OK TO TRANSFUSE    Crossmatch Result NOT NEEDED    Unit Number [id number]    Blood Component Type RED CELLS,LR    Unit division 00    Status of Unit REL FROM [facility]    Unit tag comment VERBAL ORDERS PER DR    Transfusion Status OK TO TRANSFUSE    Crossmatch Result NOT NEEDED    Unit Number [id number]    Blood Component Type RED CELLS,LR    Unit division 00    Status of Unit REL FROM [facility]  Unit tag comment VERBAL ORDERS PER DR    Transfusion Status OK TO TRANSFUSE    Crossmatch Result NOT NEEDED    Unit Number [id number]    Blood Component Type RED CELLS,LR    Unit division 00    Status of Unit REL FROM [facility]    Unit tag comment VERBAL ORDERS PER DR    Transfusion Status OK TO TRANSFUSE    Crossmatch Result NOT NEEDED    Unit Number [id number]    Blood Component Type RED CELLS,LR    Unit division 00    Status of Unit REL FROM [facility]    Unit tag comment VERBAL ORDERS PER DR    Transfusion Status OK TO TRANSFUSE    Crossmatch Result NOT NEEDED    Unit Number [id number]    Blood Component Type RED CELLS,LR    Unit division 00    Status of Unit ISSUED    Transfusion Status OK TO TRANSFUSE    Crossmatch Result COMPATIBLE    Unit Number [id number]    Blood Component Type RBC LR PHER1    Unit division 00    Status of Unit REL FROM [facility]    Unit tag comment EMERGENCY RELEASE    Transfusion Status OK TO TRANSFUSE    Crossmatch Result NOT NEEDED    Unit Number [id number]    Blood Component Type RED CELLS,LR    Unit division 00    Status of Unit REL FROM [facility]    Unit tag comment EMERGENCY RELEASE    Transfusion Status OK TO TRANSFUSE    Crossmatch  Result NOT NEEDED    Unit Number [id number]    Blood Component Type RBC LR PHER2    Unit division 00    Status of Unit REL FROM [facility]    Unit tag comment EMERGENCY RELEASE    Transfusion Status OK TO TRANSFUSE    Crossmatch Result NOT NEEDED    Unit Number [id number]    Blood Component Type RED CELLS,LR    Unit division 00    Status of Unit REL FROM [facility]    Unit tag comment EMERGENCY RELEASE    Transfusion Status OK TO TRANSFUSE    Crossmatch Result NOT NEEDED    Unit Number [id number]    Blood Component Type RED CELLS,LR    Unit division 00    Status of Unit ISSUED    Transfusion Status OK TO TRANSFUSE    Crossmatch Result COMPATIBLE   Lactic acid, plasma     Status: Abnormal   Collection Time: 04/17/23  2:49 AM  Result Value Ref Range   Lactic Acid, Venous >9.0 (HH) 0.5 - 1.9 mmol/L    Comment: CRITICAL RESULT CALLED TO, READ BACK BY AND VERIFIED WITH G. TATE, RN AT 0410 ON 04/17/23 BY H.HOWARD. Performed at [facility], [address], [place], [place] [address]   I-Stat arterial blood gas, ED     Status: Abnormal   Collection Time: 04/17/23  2:50 AM  Result Value Ref Range   pH, Arterial 6.943 (LL) 7.35 - 7.45   pCO2 arterial 20.0 (L) 32 - 48 mmHg   pO2, Arterial 571 (H) 83 - 108 mmHg   Bicarbonate 4.7 (L) 20.0 - 28.0 mmol/L   TCO2 5 (L) 22 - 32 mmol/L   O2 Saturation 100 %   Acid-base deficit 27.0 (H) 0.0 - 2.0 mmol/L   Sodium 133 (L) 135 - 145 mmol/L   Potassium 3.6 3.5 - 5.1 mmol/L  Calcium, Ion 0.96 (L) 1.15 - 1.40 mmol/L   HCT 27.0 (L) 39.0 - 52.0 %   Hemoglobin 9.2 (L) 13.0 - 17.0 g/dL   Patient temperature 16.1 F    Collection site RADIAL, ALLEN'S TEST ACCEPTABLE    Drawn by Operator    Sample type ARTERIAL    Comment NOTIFIED PHYSICIAN   Prepare RBC     Status: None   Collection Time: 04/17/23  3:03 AM  Result Value Ref Range   Order Confirmation      BB SAMPLE OR UNITS ALREADY AVAILABLE Performed at [facility], [address], [place], [place] [address]     CT CHEST ABDOMEN PELVIS W CONTRAST  Addendum Date: 04/17/2023   ADDENDUM REPORT: 04/17/2023 02:55 ADDENDUM: Close clinical follow-up and monitoring of renal function is recommended. Electronically Signed   By: Almira Bar M.D.   On: 04/17/2023 02:55   Result Date: 04/17/2023 CLINICAL DATA:  Fall injury with blunt polytrauma. EXAM: CT CHEST, ABDOMEN, AND PELVIS WITH CONTRAST TECHNIQUE: Multidetector CT imaging of the chest, abdomen and pelvis was performed following the standard protocol during bolus administration of intravenous contrast. RADIATION DOSE REDUCTION: This exam was performed according to the departmental dose-optimization program which includes automated exposure control, adjustment of the mA and/or kV according to patient size and/or use of iterative reconstruction technique. CONTRAST:  75mL OMNIPAQUE IOHEXOL 350 MG/ML SOLN COMPARISON:  CT chest, abdomen and pelvis with contrast 09/21/2022, and CTA chest, abdomen and pelvis 06/27/2022. Both of these were performed at [facility] and therefore reports for these studies are unavailable. FINDINGS: CT CHEST FINDINGS Cardiovascular: The cardiac size is stable, slightly enlarged, with again noted apical myocardial thinning and fatty infiltration consistent with a chronic apical infarct. There are similar chronic changes to the left lateral ventricular wall as well. The pulmonary veins are decompressed. There are moderate to heavy three-vessel coronary artery calcifications. The pulmonary arteries are normal in caliber and are centrally clear on this nondedicated exam. Thoracic aorta is tortuous, with scattered calcification along the valve leaflets. There is moderate to heavy calcific plaque in the aorta and great vessels but no appreciable great vessel stenosis greater than 50%. There is a patent normal variant origin of left vertebral artery from the aortic arch. There is no aortic aneurysm,  stenosis or dissection. Mediastinum/Nodes: ETT is in place with tip 2.4 cm from the carina. The trachea is clear. The main bronchi are clear. NGT is in place entering the stomach and directed to the left with the tip abutting the wall of the proximal fundus. There is retained fluid in the mid to distal esophagus versus reflux. No wall thickening. There is heterogeneous thyroid without a visible focal nodule. No axillary or intrathoracic adenopathy is seen. Lungs/Pleura: There is a small low-density layering left pleural effusion. There is no right pleural effusion. There is no pneumothorax. There is mild posterior atelectasis in the lung bases. Scattered chronic linear scar-like opacities in the lower lobes. There is a calcified granuloma in the right middle lobe laterally. No noncalcified nodules, contusions or infiltrates are seen. Musculoskeletal: There are mild acute anterior wedge compression fractures of the C7 and T1 vertebral bodies. No spinal canal hematoma is seen. There is mild wedging of the T3 vertebral body which was noted previously and is unchanged. There are multilevel thoracic spine bridging syndesmophytes, contiguously from down to T12 where there is a again noted a mild chronic anterior wedge compression deformity. There are subacute  partially healed fractures of the right posterior tenth and eleventh ribs and chronic healed fracture deformities of the right posterolateral sixth and seventh ribs. On the left, there are partially healed subacute fractures of the posterior second rib, of the posterior eleventh and twelfth ribs and of the posterolateral tenth rib, with chronic healed overriding fracture deformity of the anterior second rib. No acute displaced rib fracture is suspected. CT ABDOMEN PELVIS FINDINGS Hepatobiliary: In the lower medial aspect of hepatic segment 5, there is an ovoid hypodense lesion again noted, larger than on 09/21/2022 and was not seen on 06/27/2022. This currently  measures 2.7 x 1.6 cm with a Hounsfield density of 37 and previously measured 2.3 x 1.3 cm. There is a 5 mm stable too small to characterize hypodensity anteriorly in segment 3 on 3:59. MRI without and with contrast is recommended for further evaluation. Remaining liver is homogeneous with no evidence of an acute injury or perihepatic hematoma. Gallbladder is absent and there is no biliary dilatation. Pancreas: The head and neck of pancreas have been previously removed and there are surgical clips in the area. There is either a pancreaticojejunostomy or pancreaticoduodenostomy anterior to the pancreatic resection. There is slight edema around the body and tail of the pancreas concerning for acute interstitial pancreatitis. There is increased pancreatic ductal prominence now measuring 4 mm. No mass is seen. Spleen: No mass enhancement or acute injury are suspected. Adrenals/Urinary Tract: There is no adrenal mass. Both kidneys are abnormal compared to prior studies with bilateral diffuse patchy hypoenhancement which could be due to multifocal infarctions, contrast nephrotoxicity or an infectious process. Multiple tiny too small to characterize hypodensities were previously noted in the kidneys but are obscured in the areas of hypoenhancement today. There is no urinary stone or obstruction. The posterior half of the bladder is obscured by spray artifact from bilateral hip replacements. The anterior bladder is mildly thickened which was seen on both of the prior studies and could be due to hypertrophy or cystitis. 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 Thickened folds are again noted in the left abdominal small bowel which were noted previously. There are mildly dilated mid abdominal small bowel segments up to 3.2 cm without visible transition and could be due to ileus or low-grade obstruction. Rest of the small bowel is normal caliber. An appendix is not seen in this  patient. There is diverticulosis without evidence of diverticulitis. Vascular/Lymphatic: There is extensive heavy aortic, iliac and branch vessel atherosclerosis including both renal arteries. No AAA is seen. The pelvic sidewalls are obscured by the hip replacements, otherwise, there is no visible adenopathy. Reproductive: The prostate is obscured by the hip replacements but it was previously not enlarged. Other: There is minimal ascites in the abdomen. No ascites in the pelvis. There are mild mesenteric congestive changes. Musculoskeletal: Osteopenia. No lumbar compression fracture. No regional skeletal fracture is seen. Right hip replacement is new from 09/21/2022. IMPRESSION: 1. Mild acute anterior wedge compression fractures of the C7 and T1 vertebral bodies. 2. No other acute trauma related findings in the chest, abdomen or pelvis. 3. Small left pleural effusion. 4. 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 5. Aortic and coronary artery atherosclerosis. 6. Retained fluid in the mid to distal esophagus versus reflux. 7. Prior Whipple procedure with slight edema around the body and tail of the pancreas concerning for acute interstitial pancreatitis, with increased prominence of the pancreatic duct since the last CT. 8. Interval development of  diffuse patchy hypoenhancement of both kidneys which could be due to multifocal infarctions, contrast nephrotoxicity or an infectious process. 9. Increased size of a homogeneous low-density lesion in the lower medial aspect of segment 5 of the liver. MRI without and with contrast is recommended. 10. Thickened folds in the left abdominal small bowel which could be due to enteritis, with mildly dilated mid abdominal small bowel segments up to 3.2 cm without visible transition. This could be due to ileus or low-grade obstruction. 11. Minimal ascites and mild mesenteric edema. 12. Osteopenia and degenerative change. 13. Critical Value/emergent results were  called by telephone at the time of interpretation on 04/17/2023 at 1:51 am to provider Dr. Cliffton Asters, who verbally acknowledged these results. Electronically Signed: By: Almira Bar M.D. On: 04/17/2023 02:33   CT HEAD WO CONTRAST  Result Date: 04/17/2023 CLINICAL DATA:  Trauma EXAM: CT HEAD WITHOUT CONTRAST CT CERVICAL SPINE WITHOUT CONTRAST TECHNIQUE: Multidetector CT imaging of the head and cervical spine was performed following the standard protocol without intravenous contrast. Multiplanar CT image reconstructions of the cervical spine were also generated. RADIATION DOSE REDUCTION: This exam was performed according to the departmental dose-optimization program which includes automated exposure control, adjustment of the mA and/or kV according to patient size and/or use of iterative reconstruction technique. COMPARISON:  03/04/2022 CT head and cervical spine, CT chest 09/21/2022 FINDINGS: CT HEAD FINDINGS Brain: Hypodense collection overlying the left frontoparietal convexity measures up to 1.2 cm (series 5, image 49). Interval decrease in the size of the previously noted right frontal convexity collection, which measures up to 0.5 cm, previously 0.8 cm when remeasured similarly. No evidence of acute infarct, hemorrhage, mass, mass effect, or midline shift. No hydrocephalus. Small focus of encephalomalacia in the right frontal lobe is unchanged. Vascular: No hyperdense vessel. Atherosclerotic calcifications in the intracranial carotid and vertebral arteries. Skull: Negative for fracture or focal lesion. Biparietal and bifrontal burr holes. Sinuses/Orbits: No acute finding. Status post bilateral lens replacements. Other: The mastoid air cells are well aerated. CT CERVICAL SPINE FINDINGS Alignment: No traumatic listhesis. Skull base and vertebrae: Acute fractures of C7 and T1, with 15% vertebral body height loss at C7 and 20% vertebral body height loss at T1. The T1 fracture appears to extend from the anterior  through the posterior cortex, without significant retropulsion. The C7 fracture is more anterior than posterior. Soft tissues and spinal canal: No significant prevertebral fluid or swelling. No visible canal hematoma. Disc levels: Degenerative changes in the cervical spine. No significant spinal canal stenosis. Upper chest: For findings in the thorax, please see same day CT chest. IMPRESSION: 1. Acute fractures of C7 and T1, with 15% vertebral body height loss at C7 and 20% vertebral body height loss at T1. The T1 fracture appears to extend from the anterior through the posterior cortex, without significant retropulsion. 2. 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 3. 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 4. No additional acute intracranial process. Imaging results were communicated on 04/17/2023 at 1:52 am to provider Daric Koren via secure text paging. Electronically Signed   By: Wiliam Ke M.D.   On: 04/17/2023 01:53   CT CERVICAL SPINE WO CONTRAST  Result Date: 04/17/2023 CLINICAL DATA:  Trauma EXAM: CT HEAD WITHOUT CONTRAST CT CERVICAL SPINE WITHOUT CONTRAST TECHNIQUE: Multidetector CT imaging of the head and cervical spine was  performed following the standard protocol without intravenous contrast. Multiplanar CT image reconstructions of the cervical spine were also generated. RADIATION DOSE REDUCTION: This exam was performed according to the departmental dose-optimization program which includes automated exposure control, adjustment of the mA and/or kV according to patient size and/or use of iterative reconstruction technique. COMPARISON:  03/04/2022 CT head and cervical spine, CT chest 09/21/2022 FINDINGS: CT HEAD FINDINGS Brain: Hypodense collection overlying the left frontoparietal convexity measures up to 1.2 cm  (series 5, image 49). Interval decrease in the size of the previously noted right frontal convexity collection, which measures up to 0.5 cm, previously 0.8 cm when remeasured similarly. No evidence of acute infarct, hemorrhage, mass, mass effect, or midline shift. No hydrocephalus. Small focus of encephalomalacia in the right frontal lobe is unchanged. Vascular: No hyperdense vessel. Atherosclerotic calcifications in the intracranial carotid and vertebral arteries. Skull: Negative for fracture or focal lesion. Biparietal and bifrontal burr holes. Sinuses/Orbits: No acute finding. Status post bilateral lens replacements. Other: The mastoid air cells are well aerated. CT CERVICAL SPINE FINDINGS Alignment: No traumatic listhesis. Skull base and vertebrae: Acute fractures of C7 and T1, with 15% vertebral body height loss at C7 and 20% vertebral body height loss at T1. The T1 fracture appears to extend from the anterior through the posterior cortex, without significant retropulsion. The C7 fracture is more anterior than posterior. Soft tissues and spinal canal: No significant prevertebral fluid or swelling. No visible canal hematoma. Disc levels: Degenerative changes in the cervical spine. No significant spinal canal stenosis. Upper chest: For findings in the thorax, please see same day CT chest. IMPRESSION: 1. Acute fractures of C7 and T1, with 15% vertebral body height loss at C7 and 20% vertebral body height loss at T1. The T1 fracture appears to extend from the anterior through the posterior cortex, without significant retropulsion. 2. 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 3. 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 4. No additional acute intracranial process. Imaging results were communicated on 04/17/2023 at 1:52 am to  provider Peter Daquila via secure text paging. Electronically Signed   By: Wiliam Ke M.D.   On: 04/17/2023 01:53   DG Chest Port 1 View  Result Date: 04/17/2023 CLINICAL DATA:  Fall, chest trauma EXAM: PORTABLE CHEST 1 VIEW COMPARISON:  None Available. FINDINGS: Left costophrenic angle excluded from view. Endotracheal tube 3.2 cm above the carina. Nasogastric tube tip within the gastric fundus. Visualized lungs are clear. No pneumothorax. No pleural effusion. Cardiac size within normal limits. Pulmonary vascularity is normal. Healed right rib fractures noted. No acute bone abnormality. IMPRESSION: 1. Endotracheal tube 3.2 cm above the carina. Nasogastric tube tip within the gastric fundus. Electronically Signed   By: Helyn Numbers M.D.   On: 04/17/2023 01:25   DG Pelvis Portable  Result Date: 04/17/2023 CLINICAL DATA:  Fall, pelvic trauma EXAM: PORTABLE PELVIS 1-2 VIEWS COMPARISON:  None Available. 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 No acute fracture or dislocation. Bilateral common iliac artery stents noted. Vascular calcifications noted. IMPRESSION: 1. Bilateral total hip arthroplasty. No acute fracture or dislocation. Electronically Signed   By: Helyn Numbers M.D.   On: 04/17/2023 01:23      Assessment/Plan: 76yoM found down with based on imaging history - numerous prior falls.  Acute fxs of C7, T1 - as per nsgy Dr.  Thomas - Dr. Madilyn Hook is consulting L chronic SDH - as per nsgy Dr. Maisie Fus R chronic SDH -  as per nsgy  Hypotension - FAST negative. CT chest/abd/pelvis shows no evidence of hemorrhage. Given 2U PRBC in trauma bay on arrival for hypotension presumed acute blood loss; given no evidence of acute blood loss on imaging, would continue with IVF support, vasopressor support as needed. May consider infectious workup  Acute encephalopathy - have recommended admission to ICU with CCM  Dispo - ICU  I spent a total of 90 minutes in both face-to-face and  non-face-to-face activities, excluding procedures performed, for this visit on the date of this encounter.   Marin Olp, MD [facility]

## 2023-04-17 NOTE — Progress Notes (Signed)
77 year old male with alcohol abuse was brought into the emergency department s/p fall, on workup he was noted to be hypothermic, hypotensive.  Requiring endotracheal intubation and mechanical ventilation   Patient is anuric now, serum bicarbonate has improved to 18    Physical exam: General: Crtitically ill-appearing male, orally intubated HEENT: [place]/AT, eyes anicteric.  ETT and OGT in place Neuro: Opens eyes with vocal stimuli, intermittently following commands, agitated and restless Chest: Coarse breath sounds, no wheezes or rhonchi Heart: Regular rate and rhythm, no murmurs or gallops Abdomen: Soft, nondistended, bowel sounds present Skin: No rash  Labs and images were reviewed  Assessment plan: Septic shock likely due to enteritis Severe metabolic acidosis Anuric AKI Shock liver Status post fall with C7/T1 fractures Chronic bilateral subdural hemorrhage Acute/metabolic septic encephalopathy Lactic acidosis Hypokalemia Possible ileus  Continue vasopressor support MAP goal 65 Continue aggressive IV fluid resuscitation Patient is on bicarbonate infusion Serum bicarbonate has improved to 18, continue bicarbonate He is not making urine Urinary bladder was a skin which showed no urine output Trend LFTs Neurosurgery is following Lactate remain elevated >, continue to monitor Continue thiamine, folate and multivitamin Continue aggressive electrolyte supplement  My critical care time 42 minutes   Cheri Fowler, MD [facility] See Amion for pager If no response to pager, please call [phone] until 7pm After 7pm, Please call E-link [phone]

## 2023-04-17 NOTE — ED Provider Notes (Signed)
INTUBATION Performed by: Antony Madura  Required items: required blood products, implants, devices, and special equipment available Patient identity confirmed: provided demographic data and hospital-assigned identification number Time out: Immediately prior to procedure a "time out" was called to verify the correct patient, procedure, equipment, support staff and site/side marked as required.  Indications: AMS; trauma  Intubation method: Glidescope Laryngoscopy   Preoxygenation: BVM  Sedatives: 20mg  Etomidate Paralytic: 100mg  Succinylcholine  Tube Size: 7.5 cuffed  Post-procedure assessment: chest rise and ETCO2 monitor Breath sounds: equal and absent over the epigastrium Tube secured with: ETT holder Chest x-ray interpreted by radiologist and me.  Chest x-ray findings: endotracheal tube in appropriate position  Patient tolerated the procedure well with no immediate complications.     Antony Madura, PA-C 04/17/23 0214    Tilden Fossa, MD 04/17/23 1610    Tilden Fossa, MD 04/18/23 [phone]

## 2023-04-17 NOTE — Progress Notes (Signed)
   04/17/23 0046  Spiritual Encounters  Type of Visit Attempt (pt unavailable)  Reason for visit Urgent spiritual support  OnCall Visit Yes   Responded to page. Unable to provide care, medical team caring for Patient. Patient taken for CT scan. No family available. Patient originally listed as "[place] Doe" , will follow up

## 2023-04-17 NOTE — ED Provider Notes (Signed)
[place] EMERGENCY DEPARTMENT AT [facility] Provider Note   CSN: [id number] Arrival date & time: 04/17/23  0046     History  Chief Complaint  Patient presents with   Level 1    Chad Winters is a 77 y.o. male.  The history is provided by the EMS personnel.  Chad Winters is a 77 y.o. male who presents to the Emergency Department complaining of trauma.  He presents to the ED by EMS after being found in his back yard.  He was called out for "unknown problem." EMS reports on their arrival pt responding to verbal stimuli, GCS 11 on their arrival with decline in mental status.     Home Medications Prior to Admission medications   Not on File      Allergies    Ciprofloxacin, Codeine, and Lisinopril    Review of Systems   Review of Systems  Unable to perform ROS: Patient unresponsive    Physical Exam Updated Vital Signs BP (!) 105/58   Pulse 85   Temp (!) 93.9 F (34.4 C)   Resp (!) 30   Ht 6' (1.829 m)   Wt 62.1 kg   SpO2 100%   BMI 18.57 kg/m  Physical Exam Vitals and nursing note reviewed.  Constitutional:      General: He is in acute distress.     Appearance: He is ill-appearing.  HENT:     Head:     Comments: Pupils dilated bilaterally.  There is a large laceration to the left frontal and parietal scalp that is approximately 7 cm long and curved. Cardiovascular:     Rate and Rhythm: Regular rhythm. Bradycardia present.     Heart sounds: Normal heart sounds.  Pulmonary:     Comments: Inadequate respiratory effort with poor air movement bilaterally.  Breath sounds bilaterally. Abdominal:     Palpations: Abdomen is soft.     Tenderness: There is no abdominal tenderness. There is no guarding.  Musculoskeletal:     Comments: Abrasions and skin tears to all 4 extremities.  No clear deformities.  Skin:    Capillary Refill: Capillary refill takes more than 3 seconds.     Comments: Cool extremities.  There is delayed cap refill in bilateral  lower extremities.  Absent pedal pulses bilaterally.  Faint femoral pulses bilaterally  Neurological:     Comments: Localizes to pain with bilateral upper extremities.  Right upper extremity appears stronger than the left upper extremity.  He does spontaneously move the right lower extremity and localizes to pain.  He also spontaneously moves the left lower extremity but this appears to be less active. GCS 1-1-5     ED Results / Procedures / Treatments   Labs (all labs ordered are listed, but only abnormal results are displayed) Labs Reviewed  COMPREHENSIVE METABOLIC PANEL - Abnormal; Notable for the following components:      Result Value   Sodium 134 (*)    Potassium 3.3 (*)    CO2 <7 (*)    BUN 28 (*)    Creatinine, Ser 2.03 (*)    Calcium 7.4 (*)    Total Protein 4.7 (*)    Albumin 1.8 (*)    AST 927 (*)    ALT 350 (*)    GFR, Estimated 34 (*)    All other components within normal limits  CBC - Abnormal; Notable for the following components:   WBC 15.2 (*)    RBC 3.17 (*)    Hemoglobin  9.6 (*)    HCT 31.1 (*)    RDW 16.0 (*)    All other components within normal limits  ETHANOL - Abnormal; Notable for the following components:   Alcohol, Ethyl (B) 122 (*)    All other components within normal limits  URINALYSIS, ROUTINE W REFLEX MICROSCOPIC - Abnormal; Notable for the following components:   Protein, ur >=300 (*)    Bacteria, UA RARE (*)    All other components within normal limits  LACTIC ACID, PLASMA - Abnormal; Notable for the following components:   Lactic Acid, Venous >9.0 (*)    All other components within normal limits  MAGNESIUM - Abnormal; Notable for the following components:   Magnesium 2.6 (*)    All other components within normal limits  AMMONIA - Abnormal; Notable for the following components:   Ammonia 143 (*)    All other components within normal limits  LACTIC ACID, PLASMA - Abnormal; Notable for the following components:   Lactic Acid, Venous  >9.0 (*)    All other components within normal limits  LACTIC ACID, PLASMA - Abnormal; Notable for the following components:   Lactic Acid, Venous >9.0 (*)    All other components within normal limits  BRAIN NATRIURETIC PEPTIDE - Abnormal; Notable for the following components:   B Natriuretic Peptide 197.4 (*)    All other components within normal limits  LIPASE, BLOOD - Abnormal; Notable for the following components:   Lipase 197 (*)    All other components within normal limits  APTT - Abnormal; Notable for the following components:   aPTT 45 (*)    All other components within normal limits  PROTIME-INR - Abnormal; Notable for the following components:   Prothrombin Time 18.7 (*)    INR 1.6 (*)    All other components within normal limits  GLUCOSE, CAPILLARY - Abnormal; Notable for the following components:   Glucose-Capillary 133 (*)    All other components within normal limits  I-STAT CHEM 8, ED - Abnormal; Notable for the following components:   Sodium 133 (*)    Potassium 5.4 (*)    BUN 30 (*)    Creatinine, Ser 2.20 (*)    Calcium, Ion 0.78 (*)    TCO2 8 (*)    Hemoglobin 10.2 (*)    HCT 30.0 (*)    All other components within normal limits  CBG MONITORING, ED - Abnormal; Notable for the following components:   Glucose-Capillary 102 (*)    All other components within normal limits  I-STAT VENOUS BLOOD GAS, ED - Abnormal; Notable for the following components:   pH, Ven 6.721 (*)    pCO2, Ven 30.8 (*)    pO2, Ven 87 (*)    Bicarbonate 4.0 (*)    TCO2 <5 (*)    Acid-base deficit 30.0 (*)    Sodium 132 (*)    Potassium 5.4 (*)    Calcium, Ion 0.80 (*)    HCT 30.0 (*)    Hemoglobin 10.2 (*)    All other components within normal limits  I-STAT ARTERIAL BLOOD GAS, ED - Abnormal; Notable for the following components:   pH, Arterial 6.943 (*)    pCO2 arterial 20.0 (*)    pO2, Arterial 571 (*)    Bicarbonate 4.7 (*)    TCO2 5 (*)    Acid-base deficit 27.0 (*)     Sodium 133 (*)    Calcium, Ion 0.96 (*)    HCT 27.0 (*)  Hemoglobin 9.2 (*)    All other components within normal limits  POCT I-STAT 7, (LYTES, BLD GAS, ICA,H+H) - Abnormal; Notable for the following components:   pH, Arterial 7.033 (*)    pCO2 arterial 25.5 (*)    pO2, Arterial 184 (*)    Bicarbonate 7.1 (*)    TCO2 8 (*)    Acid-base deficit 23.0 (*)    Sodium 133 (*)    Calcium, Ion 0.94 (*)    HCT 25.0 (*)    Hemoglobin 8.5 (*)    All other components within normal limits  TROPONIN I (HIGH SENSITIVITY) - Abnormal; Notable for the following components:   Troponin I (High Sensitivity) 35 (*)    All other components within normal limits  MRSA NEXT GEN BY PCR, NASAL  CULTURE, BLOOD (ROUTINE X 2)  CULTURE, BLOOD (ROUTINE X 2)  URINE CULTURE  RAPID URINE DRUG SCREEN, HOSP PERFORMED  CK  TSH  PROCALCITONIN  CORTISOL  BLOOD GAS, ARTERIAL  COMPREHENSIVE METABOLIC PANEL  CBC  BLOOD GAS, ARTERIAL  HEPATITIS PANEL, ACUTE  TYPE AND SCREEN  PREPARE FRESH FROZEN PLASMA  PREPARE PLATELET PHERESIS  PREPARE CRYOPRECIPITATE  PREPARE RBC (CROSSMATCH)  ABO/RH  TROPONIN I (HIGH SENSITIVITY)    EKG None  Radiology US Abdomen Limited RUQ (LIVER/GB)  Result Date: 04/17/2023 CLINICAL DATA:  Elevated liver enzymes. EXAM: ULTRASOUND ABDOMEN LIMITED RIGHT UPPER QUADRANT COMPARISON:  CT with IV contrast earlier today. FINDINGS: Gallbladder: Surgically absent. Common bile duct: Diameter: Within normal limits.  Measures 4 mm. Liver: No focal mass identified. There is increased echogenicity consistent with fatty replacement as noted on CT. Corresponding to the low-density lesion in the lower medial right lobe of the liver noted on CT and above the Hounsfield density of simple fluid, there is a thin walled anechoic uncomplicated simple cyst measuring 2.3 x 2.1 x 1.7 cm. No other focal abnormality is seen in the liver. Portal vein is patent on color Doppler imaging with normal direction of blood  flow towards the liver. Other: There is trace perihepatic free fluid. IMPRESSION: 1. No acute findings in the right upper quadrant. 2. Hepatic steatosis. 3. Simple anechoic cyst in the right lobe of the liver. 4. Trace perihepatic free fluid. Electronically Signed   By: Almira Bar M.D.   On: 04/17/2023 07:32   CT CHEST ABDOMEN PELVIS W CONTRAST  Addendum Date: 04/17/2023   ADDENDUM REPORT: 04/17/2023 02:55 ADDENDUM: Close clinical follow-up and monitoring of renal function is recommended. Electronically Signed   By: Almira Bar M.D.   On: 04/17/2023 02:55   Result Date: 04/17/2023 CLINICAL DATA:  Fall injury with blunt polytrauma. EXAM: CT CHEST, ABDOMEN, AND PELVIS WITH CONTRAST TECHNIQUE: Multidetector CT imaging of the chest, abdomen and pelvis was performed following the standard protocol during bolus administration of intravenous contrast. RADIATION DOSE REDUCTION: This exam was performed according to the departmental dose-optimization program which includes automated exposure control, adjustment of the mA and/or kV according to patient size and/or use of iterative reconstruction technique. CONTRAST:  75mL OMNIPAQUE IOHEXOL 350 MG/ML SOLN COMPARISON:  CT chest, abdomen and pelvis with contrast 09/21/2022, and CTA chest, abdomen and pelvis 06/27/2022. Both of these were performed at [facility] and therefore reports for these studies are unavailable. FINDINGS: CT CHEST FINDINGS Cardiovascular: The cardiac size is stable, slightly enlarged, with again noted apical myocardial thinning and fatty infiltration consistent with a chronic apical infarct. There are similar chronic changes to the left lateral ventricular wall  as well. The pulmonary veins are decompressed. There are moderate to heavy three-vessel coronary artery calcifications. The pulmonary arteries are normal in caliber and are centrally clear on this nondedicated exam. Thoracic aorta is tortuous, with scattered  calcification along the valve leaflets. There is moderate to heavy calcific plaque in the aorta and great vessels but no appreciable great vessel stenosis greater than 50%. There is a patent normal variant origin of left vertebral artery from the aortic arch. There is no aortic aneurysm, stenosis or dissection. Mediastinum/Nodes: ETT is in place with tip 2.4 cm from the carina. The trachea is clear. The main bronchi are clear. NGT is in place entering the stomach and directed to the left with the tip abutting the wall of the proximal fundus. There is retained fluid in the mid to distal esophagus versus reflux. No wall thickening. There is heterogeneous thyroid without a visible focal nodule. No axillary or intrathoracic adenopathy is seen. Lungs/Pleura: There is a small low-density layering left pleural effusion. There is no right pleural effusion. There is no pneumothorax. There is mild posterior atelectasis in the lung bases. Scattered chronic linear scar-like opacities in the lower lobes. There is a calcified granuloma in the right middle lobe laterally. No noncalcified nodules, contusions or infiltrates are seen. Musculoskeletal: There are mild acute anterior wedge compression fractures of the C7 and T1 vertebral bodies. No spinal canal hematoma is seen. There is mild wedging of the T3 vertebral body which was noted previously and is unchanged. There are multilevel thoracic spine bridging syndesmophytes, contiguously from down to T12 where there is a again noted a mild chronic anterior wedge compression deformity. There are subacute partially healed fractures of the right posterior tenth and eleventh ribs and chronic healed fracture deformities of the right posterolateral sixth and seventh ribs. On the left, there are partially healed subacute fractures of the posterior second rib, of the posterior eleventh and twelfth ribs and of the posterolateral tenth rib, with chronic healed overriding fracture deformity of  the anterior second rib. No acute displaced rib fracture is suspected. CT ABDOMEN PELVIS FINDINGS Hepatobiliary: In the lower medial aspect of hepatic segment 5, there is an ovoid hypodense lesion again noted, larger than on 09/21/2022 and was not seen on 06/27/2022. This currently measures 2.7 x 1.6 cm with a Hounsfield density of 37 and previously measured 2.3 x 1.3 cm. There is a 5 mm stable too small to characterize hypodensity anteriorly in segment 3 on 3:59. MRI without and with contrast is recommended for further evaluation. Remaining liver is homogeneous with no evidence of an acute injury or perihepatic hematoma. Gallbladder is absent and there is no biliary dilatation. Pancreas: The head and neck of pancreas have been previously removed and there are surgical clips in the area. There is either a pancreaticojejunostomy or pancreaticoduodenostomy anterior to the pancreatic resection. There is slight edema around the body and tail of the pancreas concerning for acute interstitial pancreatitis. There is increased pancreatic ductal prominence now measuring 4 mm. No mass is seen. Spleen: No mass enhancement or acute injury are suspected. Adrenals/Urinary Tract: There is no adrenal mass. Both kidneys are abnormal compared to prior studies with bilateral diffuse patchy hypoenhancement which could be due to multifocal infarctions, contrast nephrotoxicity or an infectious process. Multiple tiny too small to characterize hypodensities were previously noted in the kidneys but are obscured in the areas of hypoenhancement today. There is no urinary stone or obstruction. The posterior half of the bladder is obscured by spray  artifact from bilateral hip replacements. The anterior bladder is mildly thickened which was seen on both of the prior studies and could be due to hypertrophy or cystitis. 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 Thickened folds are again noted in the  left abdominal small bowel which were noted previously. There are mildly dilated mid abdominal small bowel segments up to 3.2 cm without visible transition and could be due to ileus or low-grade obstruction. Rest of the small bowel is normal caliber. An appendix is not seen in this patient. There is diverticulosis without evidence of diverticulitis. Vascular/Lymphatic: There is extensive heavy aortic, iliac and branch vessel atherosclerosis including both renal arteries. No AAA is seen. The pelvic sidewalls are obscured by the hip replacements, otherwise, there is no visible adenopathy. Reproductive: The prostate is obscured by the hip replacements but it was previously not enlarged. Other: There is minimal ascites in the abdomen. No ascites in the pelvis. There are mild mesenteric congestive changes. Musculoskeletal: Osteopenia. No lumbar compression fracture. No regional skeletal fracture is seen. Right hip replacement is new from 09/21/2022. IMPRESSION: 1. Mild acute anterior wedge compression fractures of the C7 and T1 vertebral bodies. 2. No other acute trauma related findings in the chest, abdomen or pelvis. 3. Small left pleural effusion. 4. 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 5. Aortic and coronary artery atherosclerosis. 6. Retained fluid in the mid to distal esophagus versus reflux. 7. Prior Whipple procedure with slight edema around the body and tail of the pancreas concerning for acute interstitial pancreatitis, with increased prominence of the pancreatic duct since the last CT. 8. Interval development of diffuse patchy hypoenhancement of both kidneys which could be due to multifocal infarctions, contrast nephrotoxicity or an infectious process. 9. Increased size of a homogeneous low-density lesion in the lower medial aspect of segment 5 of the liver. MRI without and with contrast is recommended. 10. Thickened folds in the left abdominal small bowel which could be due to enteritis,  with mildly dilated mid abdominal small bowel segments up to 3.2 cm without visible transition. This could be due to ileus or low-grade obstruction. 11. Minimal ascites and mild mesenteric edema. 12. Osteopenia and degenerative change. 13. Critical Value/emergent results were called by telephone at the time of interpretation on 04/17/2023 at 1:51 am to provider Dr. Cliffton Asters, who verbally acknowledged these results. Electronically Signed: By: Almira Bar M.D. On: 04/17/2023 02:33   CT HEAD WO CONTRAST  Result Date: 04/17/2023 CLINICAL DATA:  Trauma EXAM: CT HEAD WITHOUT CONTRAST CT CERVICAL SPINE WITHOUT CONTRAST TECHNIQUE: Multidetector CT imaging of the head and cervical spine was performed following the standard protocol without intravenous contrast. Multiplanar CT image reconstructions of the cervical spine were also generated. RADIATION DOSE REDUCTION: This exam was performed according to the departmental dose-optimization program which includes automated exposure control, adjustment of the mA and/or kV according to patient size and/or use of iterative reconstruction technique. COMPARISON:  03/04/2022 CT head and cervical spine, CT chest 09/21/2022 FINDINGS: CT HEAD FINDINGS Brain: Hypodense collection overlying the left frontoparietal convexity measures up to 1.2 cm (series 5, image 49). Interval decrease in the size of the previously noted right frontal convexity collection, which measures up to 0.5 cm, previously 0.8 cm when remeasured similarly. No evidence of acute infarct, hemorrhage, mass, mass effect, or midline shift. No hydrocephalus. Small focus of encephalomalacia in the right frontal lobe is unchanged. Vascular: No hyperdense vessel. Atherosclerotic calcifications in the intracranial carotid and  vertebral arteries. Skull: Negative for fracture or focal lesion. Biparietal and bifrontal burr holes. Sinuses/Orbits: No acute finding. Status post bilateral lens replacements. Other: The mastoid air cells  are well aerated. CT CERVICAL SPINE FINDINGS Alignment: No traumatic listhesis. Skull base and vertebrae: Acute fractures of C7 and T1, with 15% vertebral body height loss at C7 and 20% vertebral body height loss at T1. The T1 fracture appears to extend from the anterior through the posterior cortex, without significant retropulsion. The C7 fracture is more anterior than posterior. Soft tissues and spinal canal: No significant prevertebral fluid or swelling. No visible canal hematoma. Disc levels: Degenerative changes in the cervical spine. No significant spinal canal stenosis. Upper chest: For findings in the thorax, please see same day CT chest. IMPRESSION: 1. Acute fractures of C7 and T1, with 15% vertebral body height loss at C7 and 20% vertebral body height loss at T1. The T1 fracture appears to extend from the anterior through the posterior cortex, without significant retropulsion. 2. 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 3. 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 4. No additional acute intracranial process. Imaging results were communicated on 04/17/2023 at 1:52 am to provider WHITE via secure text paging. Electronically Signed   By: Wiliam Ke M.D.   On: 04/17/2023 01:53   CT CERVICAL SPINE WO CONTRAST  Result Date: 04/17/2023 CLINICAL DATA:  Trauma EXAM: CT HEAD WITHOUT CONTRAST CT CERVICAL SPINE WITHOUT CONTRAST TECHNIQUE: Multidetector CT imaging of the head and cervical spine was performed following the standard protocol without intravenous contrast. Multiplanar CT image reconstructions of the cervical spine were also generated. RADIATION DOSE REDUCTION: This exam was performed according to the departmental dose-optimization program which includes automated exposure control, adjustment of the mA and/or  kV according to patient size and/or use of iterative reconstruction technique. COMPARISON:  03/04/2022 CT head and cervical spine, CT chest 09/21/2022 FINDINGS: CT HEAD FINDINGS Brain: Hypodense collection overlying the left frontoparietal convexity measures up to 1.2 cm (series 5, image 49). Interval decrease in the size of the previously noted right frontal convexity collection, which measures up to 0.5 cm, previously 0.8 cm when remeasured similarly. No evidence of acute infarct, hemorrhage, mass, mass effect, or midline shift. No hydrocephalus. Small focus of encephalomalacia in the right frontal lobe is unchanged. Vascular: No hyperdense vessel. Atherosclerotic calcifications in the intracranial carotid and vertebral arteries. Skull: Negative for fracture or focal lesion. Biparietal and bifrontal burr holes. Sinuses/Orbits: No acute finding. Status post bilateral lens replacements. Other: The mastoid air cells are well aerated. CT CERVICAL SPINE FINDINGS Alignment: No traumatic listhesis. Skull base and vertebrae: Acute fractures of C7 and T1, with 15% vertebral body height loss at C7 and 20% vertebral body height loss at T1. The T1 fracture appears to extend from the anterior through the posterior cortex, without significant retropulsion. The C7 fracture is more anterior than posterior. Soft tissues and spinal canal: No significant prevertebral fluid or swelling. No visible canal hematoma. Disc levels: Degenerative changes in the cervical spine. No significant spinal canal stenosis. Upper chest: For findings in the thorax, please see same day CT chest. IMPRESSION: 1. Acute fractures of C7 and T1, with 15% vertebral body height loss at C7 and 20% vertebral body height loss at T1. The T1 fracture appears to extend from the anterior through the posterior cortex, without  significant retropulsion. 2. 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 3. 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 4. No additional acute intracranial process. Imaging results were communicated on 04/17/2023 at 1:52 am to provider WHITE via secure text paging. Electronically Signed   By: Wiliam Ke M.D.   On: 04/17/2023 01:53   DG Chest Port 1 View  Result Date: 04/17/2023 CLINICAL DATA:  Fall, chest trauma EXAM: PORTABLE CHEST 1 VIEW COMPARISON:  None Available. FINDINGS: Left costophrenic angle excluded from view. Endotracheal tube 3.2 cm above the carina. Nasogastric tube tip within the gastric fundus. Visualized lungs are clear. No pneumothorax. No pleural effusion. Cardiac size within normal limits. Pulmonary vascularity is normal. Healed right rib fractures noted. No acute bone abnormality. IMPRESSION: 1. Endotracheal tube 3.2 cm above the carina. Nasogastric tube tip within the gastric fundus. Electronically Signed   By: Helyn Numbers M.D.   On: 04/17/2023 01:25   DG Pelvis Portable  Result Date: 04/17/2023 CLINICAL DATA:  Fall, pelvic trauma EXAM: PORTABLE PELVIS 1-2 VIEWS COMPARISON:  None Available. 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 No acute fracture or dislocation. Bilateral common iliac artery stents noted. Vascular calcifications noted. IMPRESSION: 1. Bilateral total hip arthroplasty. No acute fracture or dislocation. Electronically Signed   By: Helyn Numbers M.D.   On: 04/17/2023 01:23    Procedures .Marland KitchenLaceration Repair  Date/Time: 04/17/2023 3:33 AM  Performed by: Tilden Fossa, MD Authorized by: Tilden Fossa, MD   Consent:    Consent obtained:  Emergent situation Laceration details:    Length (cm):  7 Exploration:    Hemostasis achieved with:  Direct pressure Treatment:    Area cleansed with:  Chlorhexidine and saline   Amount of cleaning:  Standard   Irrigation solution:  Sterile  saline Skin repair:    Repair method:  Staples   Number of staples:  7 Approximation:    Approximation:  Close    CRITICAL CARE Performed by: Tilden Fossa   Total critical care time: 65 minutes  Critical care time was exclusive of separately billable procedures and treating other patients.  Critical care was necessary to treat or prevent imminent or life-threatening deterioration.  Critical care was time spent personally by me on the following activities: development of treatment plan with patient and/or surrogate as well as nursing, discussions with consultants, evaluation of patient's response to treatment, examination of patient, obtaining history from patient or surrogate, ordering and performing treatments and interventions, ordering and review of laboratory studies, ordering and review of radiographic studies, pulse oximetry and re-evaluation of patient's condition.  Medications Ordered in ED Medications  norepinephrine (LEVOPHED) 4mg  in (0.016 mg/mL) premix infusion (24 mcg/min Intravenous New Bag/Given 04/17/23 1610)  thiamine (VITAMIN B1) injection 100 mg (has no administration in time range)  folic acid injection 1 mg (has no administration in time range)  multivitamin with minerals tablet 1 tablet (has no administration in time range)  lactated ringers infusion ( Intravenous New Bag/Given 04/17/23 0412)  sodium bicarbonate 150 mEq in sterile water 1,150 mL infusion ( Intravenous Rate/Dose Change 04/17/23 0532)  docusate (COLACE) 50 MG/5ML liquid 100 mg (has no administration in time range)  polyethylene glycol (MIRALAX / GLYCOLAX) packet 17 g (has no administration in time range)  pantoprazole (PROTONIX) injection 40 mg (has no administration in time range)  fentaNYL  in NS (13mcg/ml) infusion-PREMIX (75 mcg/hr Intravenous Rate/Dose Change 04/17/23 0445)  fentaNYL (SUBLIMAZE) bolus via infusion 25-100 mcg (100 mcg Intravenous Bolus from Bag 04/17/23 0423)   propofol (DIPRIVAN) 1000 MG/100ML infusion (has no administration in time range)  vancomycin (VANCOCIN) IVPB 1000 mg/200 mL premix (has no administration in time range)  piperacillin-tazobactam (ZOSYN) IVPB 3.375 g (has no administration in time range)  0.9 %  sodium chloride infusion (has no administration in time range)  Chlorhexidine Gluconate Cloth 2 % PADS 6 each (6 each Topical Given 04/17/23 0505)  Oral care mouth rinse (has no administration in time range)  Oral care mouth rinse (has no administration in time range)  vasopressin (PITRESSIN) 20 Units in sodium chloride 0.9 % 100 mL infusion-*FOR SHOCK* (0.04 Units/min Intravenous New Bag/Given 04/17/23 0642)  succinylcholine (ANECTINE) injection (100 mg Intravenous Given 04/17/23 0054)  ceFAZolin (ANCEF) IVPB 2g/100 mL premix (0 g Intravenous Stopped 04/17/23 0205)  Tdap (BOOSTRIX) injection 0.5 mL (0.5 mLs Intramuscular Given 04/17/23 0108)  levETIRAcetam (KEPPRA) IVPB 1000 mg/100 mL premix (0 mg Intravenous Stopped 04/17/23 0233)  lactated ringers bolus 1,000 mL (0 mLs Intravenous Stopped 04/17/23 0313)  sodium bicarbonate injection 100 mEq (100 mEq Intravenous Given 04/17/23 0115)  iohexol (OMNIPAQUE) 350 MG/ML injection 75 mL (75 mLs Intravenous Contrast Given 04/17/23 0139)  fentaNYL (SUBLIMAZE) injection 50 mcg (50 mcg Intravenous Given 04/17/23 0215)  piperacillin-tazobactam (ZOSYN) IVPB 3.375 g (0 g Intravenous Stopped 04/17/23 0313)  vancomycin (VANCOREADY) IVPB 1250 mg/250 mL (1,250 mg Intravenous New Bag/Given 04/17/23 0311)  lactated ringers bolus 1,000 mL (0 mLs Intravenous Stopped 04/17/23 0313)  fentaNYL (SUBLIMAZE) injection 100 mcg (100 mcg Intravenous Given 04/17/23 0145)  0.9 %  sodium chloride infusion (Manually program via Guardrails IV Fluids) (0 mLs Intravenous Stopped 04/17/23 0100)  lactated ringers bolus 1,000 mL (1,000 mLs Intravenous New Bag/Given 04/17/23 0349)  sodium bicarbonate injection 100 mEq (100 mEq Intravenous Given 04/17/23 0344)   potassium chloride (KLOR-CON) packet 40 mEq (40 mEq Per Tube Given 04/17/23 0419)  magnesium sulfate IVPB 2 g 50 mL (2 g Intravenous New Bag/Given 04/17/23 0413)  potassium chloride 10 mEq in 100 mL IVPB (10 mEq Intravenous New Bag/Given 04/17/23 0643)  fentaNYL (SUBLIMAZE) injection 25 mcg (25 mcg Intravenous Given 04/17/23 0358)  sodium bicarbonate injection 50 mEq (50 mEq Intravenous Given 04/17/23 0617)  lactulose (CHRONULAC) 10 GM/15ML solution 30 g (30 g Per Tube Given 04/17/23 0746)    ED Course/ Medical Decision Making/ A&P                             Medical Decision Making Amount and/or Complexity of Data Reviewed Labs: ordered. Radiology: ordered.  Risk Prescription drug management. Decision regarding hospitalization.  Patient brought in as a level 1 trauma alert following being found down with a head wound, unresponsive.  Patient with inappropriate respiratory effort and required RSI at time of ED arrival for respiratory support and airway protection.  RSI performed per PA note and supervised directly by myself.  He was also hypotensive and hypothermic at time of ED presentation.  Initial concern for possible hemorrhage secondary to trauma and he was treated with PRBC.  After additional trauma information available without evidence of severe hemorrhage he was treated with IV fluid resuscitation due to concern for alternative cause such as metabolic or infectious source of his hypotension.  He was also started on broad-spectrum antibiotics for possible sepsis.  VBG with  profound acidosis and he was treated with bicarb.  On repeat evaluation after intubation he is waking up and reaching for the endotracheal tube with bilateral upper extremities.  Scalp laceration repaired per note.  He was evaluated by trauma surgery in the emergency department.  Discussed with Dr. Maisie Fus with neurosurgery regarding cervical and thoracic compression fractures-recommend cervical collar, no need for repeat imaging  at this time.  He will see the patient in consult.  Critical care consulted for admission for ongoing care.  Attempted to reach patient's daughter over the phone-message was left.        Final Clinical Impression(s) / ED Diagnoses Final diagnoses:  None    Rx / DC Orders ED Discharge Orders     None         Tilden Fossa, MD 04/17/23 [phone]

## 2023-04-17 NOTE — ED Triage Notes (Signed)
[name] gcems as level 1 trauma. EMS was called out for "unknown problem" and found patient lying 10-55ft from house in a backyard. Initial gcs 11 - response to verbal stimuli only. Pt then had decorticate posturing and became unresponsive. EMS reports right blown pupil. Ventilations assisted on arrival. EMS unable to obtain BP in field. Skin tears to left arm and deformity noted to left occipital area.

## 2023-04-17 NOTE — Progress Notes (Signed)
Initial Nutrition Assessment  DOCUMENTATION CODES:   Not applicable  INTERVENTION:   -TF via OGT:  Pivot 1.5 @ 60 ml/hr (1440 ml daily)  Tube feeding regimen provides 2160 kcal (100% of needs), 135 grams of protein, and 1080 ml of H2O.    NUTRITION DIAGNOSIS:   Inadequate oral intake related to inability to eat as evidenced by NPO status.  GOAL:   Patient will meet greater than or equal to 90% of their needs  MONITOR:   TF tolerance  REASON FOR ASSESSMENT:   Consult Assessment of nutrition requirement/status, Enteral/tube feeding initiation and management  ASSESSMENT:   Pt with PMH of ETOH abuse, found down by unknown bystander. He has a tarumatic laceration on his scalp. He was intubated secondary to depressed GCS  Patient is currently intubated on ventilator support. 18 french OGT present; tip of tube in stomach per KUB on 04/17/23.  MV: 15.3 L/min Temp (24hrs), Avg:95.3 F (35.2 C), Min:90.7 F (32.6 C), Max:100.9 F (38.3 C)  Propofol: 12.24 ml/hr (provides 646 kcals)  Reviewed I/O's: +2.6 L x 24 hours and +6.1 L since admission  UOP: 65 ml x 24 hours  Per trauma notes, pt with acute fracture of C7 and T1 and rt and lt chronic SDH.   Medications reviewed and include folic acid, protonix, thiamine, levophed, and vasopressin.   No results found for: "HGBA1C" PTA DM medications are none.   Labs reviewed: Na: 132, CBGS: 157 (inpatient orders for glycemic control are none).    Diet Order:   Diet Order             Diet NPO time specified  Diet effective now                   EDUCATION NEEDS:   No education needs have been identified at this time  Skin:  Skin Assessment: Skin Integrity Issues: Skin Integrity Issues:: Incisions Incisions: closed posterior upper head  Last BM:  Unknown  Height:   Ht Readings from Last 1 Encounters:  04/17/23 6' (1.829 m)    Weight:   Wt Readings from Last 1 Encounters:  04/17/23 62.1 kg    Ideal Body  Weight:  80.9 kg  BMI:  Body mass index is 18.57 kg/m.  Estimated Nutritional Needs:   Kcal:  2002  Protein:  125-150 grams  Fluid:  > 2 L    Levada Schilling, RD, LDN, CDCES Registered Dietitian II Certified Diabetes Care and Education Specialist Please refer to [facility] for RD and/or RD on-call/weekend/after hours pager

## 2023-04-17 NOTE — ED Notes (Signed)
Unable to obtain manual BP

## 2023-04-17 NOTE — Progress Notes (Signed)
eLink Physician-Brief Progress Note Patient Name: Nazaret Neufer DOB: July 29, 1946 MRN: [id number]   Date of Service  04/17/2023  HPI/Events of Note  77 year old male with unknown past medical history close found down, transported to the hospital via EMS with multiple lacerations on the scalp, bradycardic, hypotensive, and hypothermic.  He received IV fluids and 2 units of blood.  He was intubated.  Patient remains hypothermic, tachypneic, hypotensive.  He received LR bolus, has been on norepinephrine and severe metabolic acidosis.  Initial laboratory studies consistent with acute kidney injury, shock liver, severe metabolic acidosis with lactic acidosis and CBC consistent with acute anemia.  Alcohol level 122 on arrival.  UDS negative.  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  eICU Interventions  Increase in respiratory rate to 30 from 18  Increase bicarb drip to 175 cc/h  Ampoule bicarb administration now  Increase vasopressors, add vasopressin  Pending line placement  May need renal replacement therapy down the line.  GI prophylaxis with pantoprazole.  SCDs for DVT prophylaxis in the setting of brain bleed.     Intervention Category Evaluation Type: New Patient Evaluation  Nicholad Kautzman 04/17/2023, 5:12 AM

## 2023-04-17 NOTE — Progress Notes (Signed)
Pt with minimal UOP since arrival to unit. Anuric x2 hrs. Dr. Merrily Pew notified. Bladder U/S shows empty bladder. LR bolus ordered.

## 2023-04-17 NOTE — ED Notes (Signed)
Negative fast per Dr. Cliffton Asters

## 2023-04-17 NOTE — TOC CAGE-AID Note (Signed)
Transition of Care [facility]) - CAGE-AID Screening   Patient Details  Name: Chad Winters MRN: [id number] Date of Birth: 30-Jan-1946  Transition of Care [facility]) CM/SW Contact:    Katha Hamming, RN Phone [phone] 04/17/2023, 5:32 AM  CAGE-AID Screening: Substance Abuse Screening unable to be completed due to: : Patient unable to participate (intubated)

## 2023-04-17 NOTE — H&P (Addendum)
NAME:  Chad Winters, MRN:  [id number], DOB:  1946/07/17, LOS: 0 ADMISSION DATE:  04/17/2023, CONSULTATION DATE:  5/4 REFERRING MD:  Dr. Madilyn Hook, CHIEF COMPLAINT:  SDH/facial fx   History of Present Illness:  Patient is a 77 year old male with no history on file presents to [facility] ED on 5/4 with AMS.  5/4 patient found down in backyard.  EMS called and patient transported to [facility] as a level 1 trauma.  Patient w/ multiple lacerations on scalp. BP soft, brady 50s, and hypothermic 90.8 F. Patient given IV fluids and 2 units of blood for presumed bleeding. Patient MAE spontaneously but did not respond to stimuli or voice.  Due to poor GCS patient intubated. Cbg 102. EKG sinus brady w/ qtc prolongation. CXR no acute abnormality. CT head/c-spine showing acute fractures of c7 and t1; Hypodense collection on left frontoparietal 1.2 favoring a chronic SDH. NSG consulted. CT chest/abd/pelvis w/ prior whipple procedure w/ slight edema around pancreatic body and tail concerncing for acute interstitial pancreatitis; possible enteritis; and possible ileus vs. Low-grade obstruction of small bowel; low-density lesion in lower medial aspect of segment 6 of liver recommending MRI w/ and wo contrast. Ethanol level 122. Vbg 6.72, 30, 87, 4. Wbc 15.2, hgb 9.6. Given 2 amps of bicarb. UA unremarkable. Cultures obtained and patient started on broad spectrum abx. Patient remained hypotensive and started on levo. PCCM consulted.   Pertinent ED Labs: na 134, k 3.3, co2 <7, creat 2.03, AST 927, ALT 350  Pertinent  Medical History  No medical hx on file   Significant Hospital Events: Including procedures, antibiotic start and stop dates in addition to other pertinent events   5/4 patient found down w/ AMS; multiple head lacs and abrasions BLE and BUE; Intubated for airway protection; hypotensive on levo; pccm consulted  Interim History / Subjective:  See above  Objective   Blood pressure 94/60, pulse (!) 55, temperature (!)  90.8 F (32.7 C), resp. rate 14, height 6' (1.829 m), weight 68 kg, SpO2 100 %.    Vent Mode: PRVC FiO2 (%):  [100 %] 100 % Set Rate:  [18 bmp] 18 bmp Vt Set:  [600 mL] 600 mL PEEP:  [5 cmH20] 5 cmH20 Plateau Pressure:  [15 cmH20] 15 cmH20   Intake/Output Summary (Last 24 hours) at 04/17/2023 0250 Last data filed at 04/17/2023 6387 Gross per 24 hour  Intake 200 ml  Output --  Net 200 ml   Filed Weights   04/17/23 0204  Weight: 68 kg    Examination: General:  critically ill appearing on mech vent HEENT: MM pink/moist; multiple head lacs with staple in place; ETT in place Neuro: sedated; MAE spontaneously; pupils 4mm sluggish to light CV: s1s2, brady 50s, no m/r/g PULM:  dim clear BS bilaterally; on mech vent PRVC GI: soft, bsx4 diminished  Extremities: warm/dry, no edema; multiple abrasions/skin tears on BLE and BUE    Resolved Hospital Problem list     Assessment & Plan:  Found down C7 and T1 Fractures Head lacerations ABLA Chronic SDH Multiple abrasions/skin tears on BLE and BUE P: -Trauma and NSG following; appreciate recs -sutures in place -trend cbc and transfuse for hgb <7 -woc consult -scds for dvt ppx -check PTT, PT, INR -continue c-collar -Unknown how long patient down in back yard; check ck; cont iv fluids  Septic Shock: hypothermic/bradycardic on arrival; unknown source of infection; Possible enteritis P: -continue bair hugger for normothermia -wean levo for map goal >65 -will give another lr bolus  and continue IV fluids -f/u cultures -cont broad spectrum abx -metabolic acidosis likely contributing to shock; continue bicarb -trend troponin and LA -trend wbc/fever curve -trend cbc and transfuse for hgb <7  Acute encephalopathy -ethanol 122 -CT head w/ no acute abnormality P: -uds pending -check ammonia and tsh -thiamine, folic acid, and mvi -limit sedating meds -continue treatment for sepsis as above -wean sedation for rass 0 to  -1  Hypokalemia AGMA AKI P: -replete K w/ mag -Trend BMP / urinary output -Replace electrolytes as indicated -Avoid nephrotoxic agents, ensure adequate renal perfusion  Possible ileus vs. SBO P: -continue OG tube to LIS -NPO -IV fluids  Prolonged qtc P: -avoid qtc prolonging agents -replete K w/ mag; trend and replete electrolytes as needed  Possible pancreatitis: CT abd/pelvis showing prior whipple procedure w/ sligh edema around pancreatic body and tail concerncing for acute interstitial pancreatitis P: -iv fluids -check lipase -fentanyl for pain  Transaminitis Liver lesion: CT showing low-density lesion in lower medial aspect of segment 6 of liver recommending MRI w/ and wo contrast P: -check ruq [place] and hepatitis panel -MRI when stable -avoid hepatotoxic meds   Best Practice (right click and "Reselect all SmartList Selections" daily)   Diet/type: NPO DVT prophylaxis: SCD GI prophylaxis: PPI Lines: N/A Foley:  Yes, and it is still needed Code Status:  full code Last date of multidisciplinary goals of care discussion [5/4 attempted to call daughter Dorina Hoyer over phone but no answer]  Labs   CBC: Recent Labs  Lab 04/17/23 0107 04/17/23 0200  WBC  --  15.2*  HGB 10.2*  10.2* 9.6*  HCT 30.0*  30.0* 31.1*  MCV  --  98.1  PLT  --  189    Basic Metabolic Panel: Recent Labs  Lab 04/17/23 0107  NA 133*  132*  K 5.4*  5.4*  CL 108  GLUCOSE 87  BUN 30*  CREATININE 2.20*   GFR: Estimated Creatinine Clearance: 27.9 mL/min (A) (by C-G formula based on SCr of 2.2 mg/dL (H)). Recent Labs  Lab 04/17/23 0200  WBC 15.2*    Liver Function Tests: No results for input(s): "AST", "ALT", "ALKPHOS", "BILITOT", "PROT", "ALBUMIN" in the last 168 hours. No results for input(s): "LIPASE", "AMYLASE" in the last 168 hours. No results for input(s): "AMMONIA" in the last 168 hours.  ABG    Component Value Date/Time   HCO3 4.0 (L) 04/17/2023 0107   TCO2 8 (L)  04/17/2023 0107   TCO2 <5 (L) 04/17/2023 0107   ACIDBASEDEF 30.0 (H) 04/17/2023 0107   O2SAT 80 04/17/2023 0107     Coagulation Profile: No results for input(s): "INR", "PROTIME" in the last 168 hours.  Cardiac Enzymes: No results for input(s): "CKTOTAL", "CKMB", "CKMBINDEX", "TROPONINI" in the last 168 hours.  HbA1C: No results found for: "HGBA1C"  CBG: Recent Labs  Lab 04/17/23 0103  GLUCAP 102*    Review of Systems:   Patient is encephalopathic and/or intubated; therefore, history has been obtained from chart review.    Past Medical History:  He,  has no past medical history on file.   Surgical History:  History reviewed. No pertinent surgical history.   Social History:      Family History:  His family history is not on file.   Allergies Allergies  Allergen Reactions   Ciprofloxacin Hives and Itching   Codeine Nausea And Vomiting    stiffness   Lisinopril     cough     Home Medications  Prior to Admission  medications   Not on File     Critical care time: 45 minutes    JD Anselm Lis [facility] 04/17/2023, 2:51 AM  Please see [email or web address] for pager details.  From 7A-7P if no response, please call [phone]. After hours, please call ELink [phone].

## 2023-04-17 NOTE — Consult Note (Signed)
Reason for Consult: C7-T1 compression fractures Referring Physician: Trauma  Chad Winters is an 77 y.o. male.  HPI: 77 year old gentleman found down in his backyard initially combative but nonfocal.  Patient was brought to the ER was worked up and noted to have some chronic subdural fluid collections as well as compression fractures of C7 and T1.  History reviewed. No pertinent past medical history.  History reviewed. No pertinent surgical history.  History reviewed. No pertinent family history.  Social History:  has no history on file for tobacco use, alcohol use, and drug use.  Allergies:  Allergies  Allergen Reactions   Ciprofloxacin Hives and Itching   Codeine Nausea And Vomiting    stiffness   Lisinopril     cough    Medications: I have reviewed the patient's current medications.  Results for orders placed or performed during the hospital encounter of 04/17/23 (from the past 48 hour(s))  Prepare fresh frozen plasma     Status: None   Collection Time: 04/17/23  1:00 AM  Result Value Ref Range   Unit Number [id number]    Blood Component Type LIQ PLASMA    Unit division 00    Status of Unit REL FROM [facility]    Unit tag comment VERBAL ORDERS PER DR    Transfusion Status OK TO TRANSFUSE    Unit Number [id number]    Blood Component Type LIQ PLASMA    Unit division 00    Status of Unit REL FROM [facility]    Unit tag comment VERBAL ORDERS PER DR    Transfusion Status OK TO TRANSFUSE    Unit Number [id number]    Blood Component Type LIQ PLASMA    Unit division 00    Status of Unit REL FROM [facility]    Unit tag comment VERBAL ORDERS PER DR    Transfusion Status OK TO TRANSFUSE    Unit Number [id number]    Blood Component Type LIQ PLASMA    Unit division 00    Status of Unit REL FROM [facility]    Unit tag comment VERBAL ORDERS PER DR    Transfusion Status OK TO TRANSFUSE    Unit Number [id number]    Blood Component Type LIQ PLASMA    Unit division 00    Status  of Unit REL FROM [facility]    Unit tag comment VERBAL ORDERS PER DR    Transfusion Status OK TO TRANSFUSE    Unit Number [id number]    Blood Component Type LIQ PLASMA    Unit division 00    Status of Unit REL FROM [facility]    Unit tag comment VERBAL ORDERS PER DR    Transfusion Status OK TO TRANSFUSE    Unit Number [id number]    Blood Component Type LIQ PLASMA    Unit division 00    Status of Unit REL FROM [facility]    Unit tag comment VERBAL ORDERS PER DR    Transfusion Status OK TO TRANSFUSE    Unit Number [id number]    Blood Component Type LIQ PLASMA    Unit division 00    Status of Unit REL FROM [facility]    Unit tag comment VERBAL ORDERS PER DR    Transfusion Status OK TO TRANSFUSE    Unit Number [id number]    Blood Component Type LIQ PLASMA    Unit division 00    Status of Unit REL FROM [facility]    Unit tag comment EMERGENCY RELEASE    Transfusion Status  OK TO TRANSFUSE Performed at [facility], [address] [place], [place] [address]    Unit Number [id number]    Blood Component Type LIQ PLASMA    Unit division 00    Status of Unit REL FROM [facility]    Unit tag comment EMERGENCY RELEASE    Transfusion Status OK TO TRANSFUSE    Unit Number [id number]    Blood Component Type LIQ PLASMA    Unit division 00    Status of Unit REL FROM [facility]    Unit tag comment EMERGENCY RELEASE    Transfusion Status OK TO TRANSFUSE    Unit Number [id number]    Blood Component Type LIQ PLASMA    Unit division 00    Status of Unit REL FROM [facility]    Unit tag comment EMERGENCY RELEASE    Transfusion Status OK TO TRANSFUSE   Prepare platelet pheresis     Status: None   Collection Time: 04/17/23  1:00 AM  Result Value Ref Range   Unit Number [id number]    Blood Component Type PLTP1 PSORALEN TREATED    Unit division 00    Status of Unit REL FROM [facility]    Unit tag comment EMERGENCY RELEASE    Transfusion Status OK TO TRANSFUSE   Prepare cryoprecipitate     Status:  None   Collection Time: 04/17/23  1:00 AM  Result Value Ref Range   Unit Number [id number]    Blood Component Type POOL FIBR CMPLX 2D THW    Unit division 00    Status of Unit REL FROM [facility]    Unit tag comment EMERGENCY RELEASE    Transfusion Status OK TO TRANSFUSE   Ethanol     Status: Abnormal   Collection Time: 04/17/23  1:03 AM  Result Value Ref Range   Alcohol, Ethyl (B) 122 (H) <10 mg/dL    Comment: (NOTE) Lowest detectable limit for serum alcohol is 10 mg/dL.  For medical purposes only. Performed at [facility], [address], [place], [place] [address]   CBG monitoring, ED     Status: Abnormal   Collection Time: 04/17/23  1:03 AM  Result Value Ref Range   Glucose-Capillary 102 (H) 70 - 99 mg/dL    Comment: Glucose reference range applies only to samples taken after fasting for at least 8 hours.  I-Stat Chem 8, ED     Status: Abnormal   Collection Time: 04/17/23  1:07 AM  Result Value Ref Range   Sodium 133 (L) 135 - 145 mmol/L   Potassium 5.4 (H) 3.5 - 5.1 mmol/L   Chloride 108 98 - 111 mmol/L   BUN 30 (H) 8 - 23 mg/dL   Creatinine, Ser 6.30 (H) 0.61 - 1.24 mg/dL   Glucose, Bld 87 70 - 99 mg/dL    Comment: Glucose reference range applies only to samples taken after fasting for at least 8 hours.   Calcium, Ion 0.78 (LL) 1.15 - 1.40 mmol/L   TCO2 8 (L) 22 - 32 mmol/L   Hemoglobin 10.2 (L) 13.0 - 17.0 g/dL   HCT 16.0 (L) 10.9 - 32.3 %   Comment NOTIFIED PHYSICIAN   I-Stat venous blood gas, ED     Status: Abnormal   Collection Time: 04/17/23  1:07 AM  Result Value Ref Range   pH, Ven 6.721 (LL) 7.25 - 7.43   pCO2, Ven 30.8 (L) 44 - 60 mmHg   pO2, Ven 87 (H) 32 - 45 mmHg  Bicarbonate 4.0 (L) 20.0 - 28.0 mmol/L   TCO2 <5 (L) 22 - 32 mmol/L   O2 Saturation 80 %   Acid-base deficit 30.0 (H) 0.0 - 2.0 mmol/L   Sodium 132 (L) 135 - 145 mmol/L   Potassium 5.4 (H) 3.5 - 5.1 mmol/L   Calcium, Ion 0.80 (LL) 1.15 - 1.40 mmol/L   HCT 30.0 (L) 39.0 - 52.0 %    Hemoglobin 10.2 (L) 13.0 - 17.0 g/dL   Sample type VENOUS    Comment NOTIFIED PHYSICIAN   Comprehensive metabolic panel     Status: Abnormal   Collection Time: 04/17/23  2:00 AM  Result Value Ref Range   Sodium 134 (L) 135 - 145 mmol/L   Potassium 3.3 (L) 3.5 - 5.1 mmol/L   Chloride 98 98 - 111 mmol/L   CO2 <7 (L) 22 - 32 mmol/L   Glucose, Bld 83 70 - 99 mg/dL    Comment: Glucose reference range applies only to samples taken after fasting for at least 8 hours.   BUN 28 (H) 8 - 23 mg/dL   Creatinine, Ser 1.61 (H) 0.61 - 1.24 mg/dL   Calcium 7.4 (L) 8.9 - 10.3 mg/dL   Total Protein 4.7 (L) 6.5 - 8.1 g/dL   Albumin 1.8 (L) 3.5 - 5.0 g/dL   AST 096 (H) 15 - 41 U/L   ALT 350 (H) 0 - 44 U/L   Alkaline Phosphatase 111 38 - 126 U/L   Total Bilirubin 0.4 0.3 - 1.2 mg/dL   GFR, Estimated 34 (L) >60 mL/min    Comment: (NOTE) Calculated using the CKD-EPI Creatinine Equation (2021)    Anion gap NOT CALCULATED 5 - 15    Comment: Performed at [facility], [address], [place], [place] [address]  CBC     Status: Abnormal   Collection Time: 04/17/23  2:00 AM  Result Value Ref Range   WBC 15.2 (H) 4.0 - 10.5 K/uL   RBC 3.17 (L) 4.22 - 5.81 MIL/uL   Hemoglobin 9.6 (L) 13.0 - 17.0 g/dL   HCT 98.1 (L) 19.1 - 47.8 %   MCV 98.1 80.0 - 100.0 fL   MCH 30.3 26.0 - 34.0 pg   MCHC 30.9 30.0 - 36.0 g/dL   RDW 29.5 (H) 62.1 - 30.8 %   Platelets 189 150 - 400 K/uL   nRBC 0.0 0.0 - 0.2 %    Comment: Performed at [facility], [address] [place], [place] [address]  Urinalysis, Routine w reflex microscopic -Urine, Clean Catch     Status: Abnormal   Collection Time: 04/17/23  2:00 AM  Result Value Ref Range   Color, Urine YELLOW YELLOW   APPearance CLEAR CLEAR   Specific Gravity, Urine 1.011 1.005 - 1.030   pH 6.0 5.0 - 8.0   Glucose, UA NEGATIVE NEGATIVE mg/dL   Hgb urine dipstick NEGATIVE NEGATIVE   Bilirubin Urine NEGATIVE NEGATIVE   Ketones, ur NEGATIVE NEGATIVE mg/dL    Protein, ur >=696 (A) NEGATIVE mg/dL   Nitrite NEGATIVE NEGATIVE   Leukocytes,Ua NEGATIVE NEGATIVE   RBC / HPF 0-5 0 - 5 RBC/hpf   WBC, UA 0-5 0 - 5 WBC/hpf   Bacteria, UA RARE (A) NONE SEEN   Squamous Epithelial / HPF 0-5 0 - 5 /HPF   Mucus PRESENT     Comment: Performed at [facility], [address], [place], [place] [address]  Rapid urine drug screen (hospital performed)  Status: None   Collection Time: 04/17/23  2:00 AM  Result Value Ref Range   Opiates NONE DETECTED NONE DETECTED   Cocaine NONE DETECTED NONE DETECTED   Benzodiazepines NONE DETECTED NONE DETECTED   Amphetamines NONE DETECTED NONE DETECTED   Tetrahydrocannabinol NONE DETECTED NONE DETECTED   Barbiturates NONE DETECTED NONE DETECTED    Comment: (NOTE) DRUG SCREEN FOR MEDICAL PURPOSES ONLY.  IF CONFIRMATION IS NEEDED FOR ANY PURPOSE, NOTIFY LAB WITHIN 5 DAYS.  LOWEST DETECTABLE LIMITS FOR URINE DRUG SCREEN Drug Class                     Cutoff (ng/mL) Amphetamine and metabolites    1000 Barbiturate and metabolites    200 Benzodiazepine                 200 Opiates and metabolites        300 Cocaine and metabolites        300 THC                            50 Performed at [facility], [address] [place], [place] [address]   ABO/Rh     Status: None   Collection Time: 04/17/23  2:00 AM  Result Value Ref Range   ABO/RH(D)      A POS Performed at [facility], [address], [place], [place] [address]   Type and screen Ordered by PROVIDER DEFAULT     Status: None (Preliminary result)   Collection Time: 04/17/23  2:43 AM  Result Value Ref Range   ABO/RH(D) A POS    Antibody Screen NEG    Sample Expiration 04/20/2023,2359    Unit Number [id number]    Blood Component Type RED CELLS,LR    Unit division 00    Status of Unit REL FROM [facility]    Unit tag comment VERBAL ORDERS PER DR    Transfusion Status OK TO TRANSFUSE    Crossmatch Result NOT NEEDED    Unit Number  [id number]    Blood Component Type RED CELLS,LR    Unit division 00    Status of Unit REL FROM [facility]    Unit tag comment VERBAL ORDERS PER DR    Transfusion Status OK TO TRANSFUSE    Crossmatch Result NOT NEEDED    Unit Number [id number]    Blood Component Type RED CELLS,LR    Unit division 00    Status of Unit REL FROM [facility]    Unit tag comment VERBAL ORDERS PER DR    Transfusion Status OK TO TRANSFUSE    Crossmatch Result NOT NEEDED    Unit Number [id number]    Blood Component Type RED CELLS,LR    Unit division 00    Status of Unit REL FROM [facility]    Unit tag comment VERBAL ORDERS PER DR    Transfusion Status OK TO TRANSFUSE    Crossmatch Result NOT NEEDED    Unit Number [id number]    Blood Component Type RED CELLS,LR    Unit division 00    Status of Unit REL FROM [facility]    Unit tag comment VERBAL ORDERS PER DR    Transfusion Status OK TO TRANSFUSE    Crossmatch Result NOT NEEDED    Unit Number [id number]    Blood Component Type RED CELLS,LR    Unit division 00    Status of Unit REL FROM  [facility]    Unit tag comment VERBAL ORDERS PER DR    Transfusion Status OK TO TRANSFUSE    Crossmatch Result NOT NEEDED    Unit Number [id number]    Blood Component Type RED CELLS,LR    Unit division 00    Status of Unit REL FROM [facility]    Unit tag comment VERBAL ORDERS PER DR    Transfusion Status OK TO TRANSFUSE    Crossmatch Result NOT NEEDED    Unit Number [id number]    Blood Component Type RED CELLS,LR    Unit division 00    Status of Unit REL FROM [facility]    Unit tag comment VERBAL ORDERS PER DR    Transfusion Status OK TO TRANSFUSE    Crossmatch Result NOT NEEDED    Unit Number [id number]    Blood Component Type RED CELLS,LR    Unit division 00    Status of Unit ISSUED    Transfusion Status OK TO TRANSFUSE    Crossmatch Result COMPATIBLE    Unit Number [id number]    Blood Component Type RBC LR PHER1    Unit division 00    Status of Unit REL FROM  [facility]    Unit tag comment EMERGENCY RELEASE    Transfusion Status OK TO TRANSFUSE    Crossmatch Result NOT NEEDED    Unit Number [id number]    Blood Component Type RED CELLS,LR    Unit division 00    Status of Unit REL FROM [facility]    Unit tag comment EMERGENCY RELEASE    Transfusion Status OK TO TRANSFUSE    Crossmatch Result NOT NEEDED    Unit Number [id number]    Blood Component Type RBC LR PHER2    Unit division 00    Status of Unit REL FROM [facility]    Unit tag comment EMERGENCY RELEASE    Transfusion Status OK TO TRANSFUSE    Crossmatch Result NOT NEEDED    Unit Number [id number]    Blood Component Type RED CELLS,LR    Unit division 00    Status of Unit REL FROM [facility]    Unit tag comment EMERGENCY RELEASE    Transfusion Status OK TO TRANSFUSE    Crossmatch Result NOT NEEDED    Unit Number [id number]    Blood Component Type RED CELLS,LR    Unit division 00    Status of Unit ISSUED    Transfusion Status OK TO TRANSFUSE    Crossmatch Result COMPATIBLE   Lactic acid, plasma     Status: Abnormal   Collection Time: 04/17/23  2:49 AM  Result Value Ref Range   Lactic Acid, Venous >9.0 (HH) 0.5 - 1.9 mmol/L    Comment: CRITICAL RESULT CALLED TO, READ BACK BY AND VERIFIED WITH G. TATE, RN AT 0410 ON 04/17/23 BY H.HOWARD. Performed at [facility], [address] [place], [place] [address]   I-Stat arterial blood gas, ED     Status: Abnormal   Collection Time: 04/17/23  2:50 AM  Result Value Ref Range   pH, Arterial 6.943 (LL) 7.35 - 7.45   pCO2 arterial 20.0 (L) 32 - 48 mmHg   pO2, Arterial 571 (H) 83 - 108 mmHg   Bicarbonate 4.7 (L) 20.0 - 28.0 mmol/L   TCO2 5 (L) 22 - 32 mmol/L   O2 Saturation 100 %   Acid-base deficit 27.0 (H) 0.0 - 2.0 mmol/L   Sodium 133 (L) 135 - 145 mmol/L   Potassium 3.6  3.5 - 5.1 mmol/L   Calcium, Ion 0.96 (L) 1.15 - 1.40 mmol/L   HCT 27.0 (L) 39.0 - 52.0 %   Hemoglobin 9.2 (L) 13.0 - 17.0 g/dL   Patient temperature 16.1 F     Collection site RADIAL, ALLEN'S TEST ACCEPTABLE    Drawn by Operator    Sample type ARTERIAL    Comment NOTIFIED PHYSICIAN   Prepare RBC     Status: None   Collection Time: 04/17/23  3:03 AM  Result Value Ref Range   Order Confirmation      BB SAMPLE OR UNITS ALREADY AVAILABLE Performed at [facility], [address], [place], [place] [address]   CK     Status: None   Collection Time: 04/17/23  4:15 AM  Result Value Ref Range   Total CK 359 49 - 397 U/L    Comment: Performed at [facility], [address], [place], [place] [address]  Magnesium     Status: Abnormal   Collection Time: 04/17/23  4:15 AM  Result Value Ref Range   Magnesium 2.6 (H) 1.7 - 2.4 mg/dL    Comment: Performed at [facility], [address], [place], [place] [address]  TSH     Status: None   Collection Time: 04/17/23  4:15 AM  Result Value Ref Range   TSH 2.027 0.350 - 4.500 uIU/mL    Comment: Performed by a 3rd Generation assay with a functional sensitivity of <=0.01 uIU/mL. Performed at [facility], [address], [place], [place] [address]   Ammonia     Status: Abnormal   Collection Time: 04/17/23  4:15 AM  Result Value Ref Range   Ammonia 143 (H) 9 - 35 umol/L    Comment: Performed at [facility], [address], [place], [place] [address]  Lactic acid, plasma     Status: Abnormal   Collection Time: 04/17/23  4:15 AM  Result Value Ref Range   Lactic Acid, Venous >9.0 (HH) 0.5 - 1.9 mmol/L    Comment: CRITICAL VALUE NOTED. VALUE IS CONSISTENT WITH PREVIOUSLY REPORTED/CALLED VALUE Performed at [facility], [address], [place], [place] [address]   Procalcitonin     Status: None   Collection Time: 04/17/23  4:15 AM  Result Value Ref Range   Procalcitonin 0.13 ng/mL    Comment:        Interpretation: PCT (Procalcitonin) <= 0.5 ng/mL: Systemic infection (sepsis) is not likely. Local bacterial infection is possible. (NOTE)       Sepsis PCT Algorithm            Lower Respiratory Tract                                      Infection PCT Algorithm    ----------------------------     ----------------------------         PCT < 0.25 ng/mL                PCT < 0.10 ng/mL          Strongly encourage             Strongly discourage   discontinuation of antibiotics    initiation of antibiotics    ----------------------------     -----------------------------       PCT 0.25 - 0.50 ng/mL  PCT 0.10 - 0.25 ng/mL               OR       >80% decrease in PCT            Discourage initiation of                                            antibiotics      Encourage discontinuation           of antibiotics    ----------------------------     -----------------------------         PCT >= 0.50 ng/mL              PCT 0.26 - 0.50 ng/mL               AND        <80% decrease in PCT             Encourage initiation of                                             antibiotics       Encourage continuation           of antibiotics    ----------------------------     -----------------------------        PCT >= 0.50 ng/mL                  PCT > 0.50 ng/mL               AND         increase in PCT                  Strongly encourage                                      initiation of antibiotics    Strongly encourage escalation           of antibiotics                                     -----------------------------                                           PCT <= 0.25 ng/mL                                                 OR                                        > 80% decrease in PCT  Discontinue / Do not initiate                                             antibiotics  Performed at [facility], [address] [place], [place] [address]   Brain natriuretic peptide     Status: Abnormal   Collection Time: 04/17/23  4:15 AM  Result Value Ref Range   B Natriuretic Peptide 197.4 (H) 0.0 - 100.0 pg/mL     Comment: Performed at [facility], [address], [place], [place] [address]  Cortisol     Status: None   Collection Time: 04/17/23  4:15 AM  Result Value Ref Range   Cortisol, Plasma 20.6 ug/dL    Comment: (NOTE) AM    6.7 - 22.6 ug/dL PM   <09.8       ug/dL Performed at [facility], [address] [place], [place] [address]   Troponin I (High Sensitivity)     Status: Abnormal   Collection Time: 04/17/23  4:15 AM  Result Value Ref Range   Troponin I (High Sensitivity) 35 (H) <18 ng/L    Comment: (NOTE) Elevated high sensitivity troponin I (hsTnI) values and significant  changes across serial measurements may suggest ACS but many other  chronic and acute conditions are known to elevate hsTnI results.  Refer to the "Links" section for chest pain algorithms and additional  guidance. Performed at [facility], [address], [place], [place] [address]   Lipase, blood     Status: Abnormal   Collection Time: 04/17/23  4:15 AM  Result Value Ref Range   Lipase 197 (H) 11 - 51 U/L    Comment: Performed at [facility], [address], [place], [place] [address]  MRSA Next Gen by PCR, Nasal     Status: None   Collection Time: 04/17/23  5:24 AM   Specimen: Nasal Mucosa; Nasal Swab  Result Value Ref Range   MRSA by PCR Next Gen NOT DETECTED NOT DETECTED    Comment: (NOTE) The GeneXpert MRSA Assay (FDA approved for NASAL specimens only), is one component of a comprehensive MRSA colonization surveillance program. It is not intended to diagnose MRSA infection nor to guide or monitor treatment for MRSA infections. Test performance is not FDA approved in patients less than 72 years old. Performed at [facility], [address] [place], [place] [address]   I-STAT 7, (LYTES, BLD GAS, ICA, H+H)     Status: Abnormal   Collection Time: 04/17/23  5:27 AM  Result Value Ref Range   pH, Arterial 7.033 (LL) 7.35 - 7.45   pCO2 arterial 25.5 (L) 32 - 48 mmHg    pO2, Arterial 184 (H) 83 - 108 mmHg   Bicarbonate 7.1 (L) 20.0 - 28.0 mmol/L   TCO2 8 (L) 22 - 32 mmol/L   O2 Saturation 99 %   Acid-base deficit 23.0 (H) 0.0 - 2.0 mmol/L   Sodium 133 (L) 135 - 145 mmol/L   Potassium 3.9 3.5 - 5.1 mmol/L   Calcium, Ion 0.94 (L) 1.15 - 1.40 mmol/L   HCT 25.0 (L) 39.0 - 52.0 %   Hemoglobin 8.5 (L) 13.0 - 17.0 g/dL   Patient temperature 46.9 C    Sample type ARTERIAL    Comment NOTIFIED PHYSICIAN   Lactic acid, plasma  Status: Abnormal   Collection Time: 04/17/23  6:52 AM  Result Value Ref Range   Lactic Acid, Venous >9.0 (HH) 0.5 - 1.9 mmol/L    Comment: CRITICAL VALUE NOTED. VALUE IS CONSISTENT WITH PREVIOUSLY REPORTED/CALLED VALUE Performed at [facility], [address] [place], [place] [address]   APTT     Status: Abnormal   Collection Time: 04/17/23  6:52 AM  Result Value Ref Range   aPTT 45 (H) 24 - 36 seconds    Comment:        IF BASELINE aPTT IS ELEVATED, SUGGEST PATIENT RISK ASSESSMENT BE USED TO DETERMINE APPROPRIATE ANTICOAGULANT THERAPY. Performed at [facility], [address], [place], [place] [address]   Protime-INR     Status: Abnormal   Collection Time: 04/17/23  6:52 AM  Result Value Ref Range   Prothrombin Time 18.7 (H) 11.4 - 15.2 seconds   INR 1.6 (H) 0.8 - 1.2    Comment: (NOTE) INR goal varies based on device and disease states. Performed at [facility], [address] [place], [place] [address]   Troponin I (High Sensitivity)     Status: Abnormal   Collection Time: 04/17/23  6:52 AM  Result Value Ref Range   Troponin I (High Sensitivity) 49 (H) <18 ng/L    Comment: (NOTE) Elevated high sensitivity troponin I (hsTnI) values and significant  changes across serial measurements may suggest ACS but many other  chronic and acute conditions are known to elevate hsTnI results.  Refer to the "Links" section for chest pain algorithms and additional  guidance. Performed at [facility]  Lab, [address] [place], [place] [address]   Glucose, capillary     Status: Abnormal   Collection Time: 04/17/23  7:35 AM  Result Value Ref Range   Glucose-Capillary 133 (H) 70 - 99 mg/dL    Comment: Glucose reference range applies only to samples taken after fasting for at least 8 hours.    US Abdomen Limited RUQ (LIVER/GB)  Result Date: 04/17/2023 CLINICAL DATA:  Elevated liver enzymes. EXAM: ULTRASOUND ABDOMEN LIMITED RIGHT UPPER QUADRANT COMPARISON:  CT with IV contrast earlier today. FINDINGS: Gallbladder: Surgically absent. Common bile duct: Diameter: Within normal limits.  Measures 4 mm. Liver: No focal mass identified. There is increased echogenicity consistent with fatty replacement as noted on CT. Corresponding to the low-density lesion in the lower medial right lobe of the liver noted on CT and above the Hounsfield density of simple fluid, there is a thin walled anechoic uncomplicated simple cyst measuring 2.3 x 2.1 x 1.7 cm. No other focal abnormality is seen in the liver. Portal vein is patent on color Doppler imaging with normal direction of blood flow towards the liver. Other: There is trace perihepatic free fluid. IMPRESSION: 1. No acute findings in the right upper quadrant. 2. Hepatic steatosis. 3. Simple anechoic cyst in the right lobe of the liver. 4. Trace perihepatic free fluid. Electronically Signed   By: Almira Bar M.D.   On: 04/17/2023 07:32   CT CHEST ABDOMEN PELVIS W CONTRAST  Addendum Date: 04/17/2023   ADDENDUM REPORT: 04/17/2023 02:55 ADDENDUM: Close clinical follow-up and monitoring of renal function is recommended. Electronically Signed   By: Almira Bar M.D.   On: 04/17/2023 02:55   Result Date: 04/17/2023 CLINICAL DATA:  Fall injury with blunt polytrauma. EXAM: CT CHEST, ABDOMEN, AND PELVIS WITH CONTRAST TECHNIQUE: Multidetector CT imaging of the chest, abdomen and pelvis was performed following  the standard protocol during bolus administration of intravenous  contrast. RADIATION DOSE REDUCTION: This exam was performed according to the departmental dose-optimization program which includes automated exposure control, adjustment of the mA and/or kV according to patient size and/or use of iterative reconstruction technique. CONTRAST:  75mL OMNIPAQUE IOHEXOL 350 MG/ML SOLN COMPARISON:  CT chest, abdomen and pelvis with contrast 09/21/2022, and CTA chest, abdomen and pelvis 06/27/2022. Both of these were performed at [facility] and therefore reports for these studies are unavailable. FINDINGS: CT CHEST FINDINGS Cardiovascular: The cardiac size is stable, slightly enlarged, with again noted apical myocardial thinning and fatty infiltration consistent with a chronic apical infarct. There are similar chronic changes to the left lateral ventricular wall as well. The pulmonary veins are decompressed. There are moderate to heavy three-vessel coronary artery calcifications. The pulmonary arteries are normal in caliber and are centrally clear on this nondedicated exam. Thoracic aorta is tortuous, with scattered calcification along the valve leaflets. There is moderate to heavy calcific plaque in the aorta and great vessels but no appreciable great vessel stenosis greater than 50%. There is a patent normal variant origin of left vertebral artery from the aortic arch. There is no aortic aneurysm, stenosis or dissection. Mediastinum/Nodes: ETT is in place with tip 2.4 cm from the carina. The trachea is clear. The main bronchi are clear. NGT is in place entering the stomach and directed to the left with the tip abutting the wall of the proximal fundus. There is retained fluid in the mid to distal esophagus versus reflux. No wall thickening. There is heterogeneous thyroid without a visible focal nodule. No axillary or intrathoracic adenopathy is seen. Lungs/Pleura: There is a small low-density layering left pleural effusion. There is no right pleural effusion. There  is no pneumothorax. There is mild posterior atelectasis in the lung bases. Scattered chronic linear scar-like opacities in the lower lobes. There is a calcified granuloma in the right middle lobe laterally. No noncalcified nodules, contusions or infiltrates are seen. Musculoskeletal: There are mild acute anterior wedge compression fractures of the C7 and T1 vertebral bodies. No spinal canal hematoma is seen. There is mild wedging of the T3 vertebral body which was noted previously and is unchanged. There are multilevel thoracic spine bridging syndesmophytes, contiguously from down to T12 where there is a again noted a mild chronic anterior wedge compression deformity. There are subacute partially healed fractures of the right posterior tenth and eleventh ribs and chronic healed fracture deformities of the right posterolateral sixth and seventh ribs. On the left, there are partially healed subacute fractures of the posterior second rib, of the posterior eleventh and twelfth ribs and of the posterolateral tenth rib, with chronic healed overriding fracture deformity of the anterior second rib. No acute displaced rib fracture is suspected. CT ABDOMEN PELVIS FINDINGS Hepatobiliary: In the lower medial aspect of hepatic segment 5, there is an ovoid hypodense lesion again noted, larger than on 09/21/2022 and was not seen on 06/27/2022. This currently measures 2.7 x 1.6 cm with a Hounsfield density of 37 and previously measured 2.3 x 1.3 cm. There is a 5 mm stable too small to characterize hypodensity anteriorly in segment 3 on 3:59. MRI without and with contrast is recommended for further evaluation. Remaining liver is homogeneous with no evidence of an acute injury or perihepatic hematoma. Gallbladder is absent and there is no biliary dilatation. Pancreas: The head and neck of pancreas have been previously removed and there are surgical clips in the  area. There is either a pancreaticojejunostomy or pancreaticoduodenostomy  anterior to the pancreatic resection. There is slight edema around the body and tail of the pancreas concerning for acute interstitial pancreatitis. There is increased pancreatic ductal prominence now measuring 4 mm. No mass is seen. Spleen: No mass enhancement or acute injury are suspected. Adrenals/Urinary Tract: There is no adrenal mass. Both kidneys are abnormal compared to prior studies with bilateral diffuse patchy hypoenhancement which could be due to multifocal infarctions, contrast nephrotoxicity or an infectious process. Multiple tiny too small to characterize hypodensities were previously noted in the kidneys but are obscured in the areas of hypoenhancement today. There is no urinary stone or obstruction. The posterior half of the bladder is obscured by spray artifact from bilateral hip replacements. The anterior bladder is mildly thickened which was seen on both of the prior studies and could be due to hypertrophy or cystitis. 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 Thickened folds are again noted in the left abdominal small bowel which were noted previously. There are mildly dilated mid abdominal small bowel segments up to 3.2 cm without visible transition and could be due to ileus or low-grade obstruction. Rest of the small bowel is normal caliber. An appendix is not seen in this patient. There is diverticulosis without evidence of diverticulitis. Vascular/Lymphatic: There is extensive heavy aortic, iliac and branch vessel atherosclerosis including both renal arteries. No AAA is seen. The pelvic sidewalls are obscured by the hip replacements, otherwise, there is no visible adenopathy. Reproductive: The prostate is obscured by the hip replacements but it was previously not enlarged. Other: There is minimal ascites in the abdomen. No ascites in the pelvis. There are mild mesenteric congestive changes. Musculoskeletal: Osteopenia. No lumbar compression  fracture. No regional skeletal fracture is seen. Right hip replacement is new from 09/21/2022. IMPRESSION: 1. Mild acute anterior wedge compression fractures of the C7 and T1 vertebral bodies. 2. No other acute trauma related findings in the chest, abdomen or pelvis. 3. Small left pleural effusion. 4. 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 5. Aortic and coronary artery atherosclerosis. 6. Retained fluid in the mid to distal esophagus versus reflux. 7. Prior Whipple procedure with slight edema around the body and tail of the pancreas concerning for acute interstitial pancreatitis, with increased prominence of the pancreatic duct since the last CT. 8. Interval development of diffuse patchy hypoenhancement of both kidneys which could be due to multifocal infarctions, contrast nephrotoxicity or an infectious process. 9. Increased size of a homogeneous low-density lesion in the lower medial aspect of segment 5 of the liver. MRI without and with contrast is recommended. 10. Thickened folds in the left abdominal small bowel which could be due to enteritis, with mildly dilated mid abdominal small bowel segments up to 3.2 cm without visible transition. This could be due to ileus or low-grade obstruction. 11. Minimal ascites and mild mesenteric edema. 12. Osteopenia and degenerative change. 13. Critical Value/emergent results were called by telephone at the time of interpretation on 04/17/2023 at 1:51 am to provider Dr. Cliffton Asters, who verbally acknowledged these results. Electronically Signed: By: Almira Bar M.D. On: 04/17/2023 02:33   CT HEAD WO CONTRAST  Result Date: 04/17/2023 CLINICAL DATA:  Trauma EXAM: CT HEAD WITHOUT CONTRAST CT CERVICAL SPINE WITHOUT CONTRAST TECHNIQUE: Multidetector CT imaging of the head and cervical spine was performed following the standard protocol without intravenous contrast. Multiplanar CT image reconstructions of the cervical spine were also generated. RADIATION DOSE  REDUCTION: This exam was performed according to the departmental dose-optimization program which includes automated exposure control, adjustment of the mA and/or kV according to patient size and/or use of iterative reconstruction technique. COMPARISON:  03/04/2022 CT head and cervical spine, CT chest 09/21/2022 FINDINGS: CT HEAD FINDINGS Brain: Hypodense collection overlying the left frontoparietal convexity measures up to 1.2 cm (series 5, image 49). Interval decrease in the size of the previously noted right frontal convexity collection, which measures up to 0.5 cm, previously 0.8 cm when remeasured similarly. No evidence of acute infarct, hemorrhage, mass, mass effect, or midline shift. No hydrocephalus. Small focus of encephalomalacia in the right frontal lobe is unchanged. Vascular: No hyperdense vessel. Atherosclerotic calcifications in the intracranial carotid and vertebral arteries. Skull: Negative for fracture or focal lesion. Biparietal and bifrontal burr holes. Sinuses/Orbits: No acute finding. Status post bilateral lens replacements. Other: The mastoid air cells are well aerated. CT CERVICAL SPINE FINDINGS Alignment: No traumatic listhesis. Skull base and vertebrae: Acute fractures of C7 and T1, with 15% vertebral body height loss at C7 and 20% vertebral body height loss at T1. The T1 fracture appears to extend from the anterior through the posterior cortex, without significant retropulsion. The C7 fracture is more anterior than posterior. Soft tissues and spinal canal: No significant prevertebral fluid or swelling. No visible canal hematoma. Disc levels: Degenerative changes in the cervical spine. No significant spinal canal stenosis. Upper chest: For findings in the thorax, please see same day CT chest. IMPRESSION: 1. Acute fractures of C7 and T1, with 15% vertebral body height loss at C7 and 20% vertebral body height loss at T1. The T1 fracture appears to extend from the anterior through the posterior  cortex, without significant retropulsion. 2. 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 3. 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 4. No additional acute intracranial process. Imaging results were communicated on 04/17/2023 at 1:52 am to provider WHITE via secure text paging. Electronically Signed   By: Wiliam Ke M.D.   On: 04/17/2023 01:53   CT CERVICAL SPINE WO CONTRAST  Result Date: 04/17/2023 CLINICAL DATA:  Trauma EXAM: CT HEAD WITHOUT CONTRAST CT CERVICAL SPINE WITHOUT CONTRAST TECHNIQUE: Multidetector CT imaging of the head and cervical spine was performed following the standard protocol without intravenous contrast. Multiplanar CT image reconstructions of the cervical spine were also generated. RADIATION DOSE REDUCTION: This exam was performed according to the departmental dose-optimization program which includes automated exposure control, adjustment of the mA and/or kV according to patient size and/or use of iterative reconstruction technique. COMPARISON:  03/04/2022 CT head and cervical spine, CT chest 09/21/2022 FINDINGS: CT HEAD FINDINGS Brain: Hypodense collection overlying the left frontoparietal convexity measures up to 1.2 cm (series 5, image 49). Interval decrease in the size of the previously noted right frontal convexity collection, which measures up to 0.5 cm, previously 0.8 cm when remeasured similarly. No evidence of acute infarct, hemorrhage, mass, mass effect, or midline shift. No hydrocephalus. Small focus of encephalomalacia in the right frontal lobe is unchanged. Vascular: No hyperdense vessel. Atherosclerotic calcifications in the intracranial carotid and vertebral arteries. Skull: Negative for fracture or focal lesion. Biparietal and bifrontal burr holes. Sinuses/Orbits: No acute finding.  Status post bilateral lens replacements. Other: The mastoid air cells are well aerated. CT CERVICAL SPINE FINDINGS Alignment: No traumatic listhesis. Skull base and vertebrae:  Acute fractures of C7 and T1, with 15% vertebral body height loss at C7 and 20% vertebral body height loss at T1. The T1 fracture appears to extend from the anterior through the posterior cortex, without significant retropulsion. The C7 fracture is more anterior than posterior. Soft tissues and spinal canal: No significant prevertebral fluid or swelling. No visible canal hematoma. Disc levels: Degenerative changes in the cervical spine. No significant spinal canal stenosis. Upper chest: For findings in the thorax, please see same day CT chest. IMPRESSION: 1. Acute fractures of C7 and T1, with 15% vertebral body height loss at C7 and 20% vertebral body height loss at T1. The T1 fracture appears to extend from the anterior through the posterior cortex, without significant retropulsion. 2. 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 3. 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 4. No additional acute intracranial process. Imaging results were communicated on 04/17/2023 at 1:52 am to provider WHITE via secure text paging. Electronically Signed   By: Wiliam Ke M.D.   On: 04/17/2023 01:53   DG Chest Port 1 View  Result Date: 04/17/2023 CLINICAL DATA:  Fall, chest trauma EXAM: PORTABLE CHEST 1 VIEW COMPARISON:  None Available. FINDINGS: Left costophrenic angle excluded from view. Endotracheal tube 3.2 cm above the carina. Nasogastric tube tip within the gastric fundus. Visualized lungs are clear. No pneumothorax. No pleural effusion. Cardiac size within normal limits. Pulmonary vascularity is normal. Healed right rib fractures noted. No acute bone  abnormality. IMPRESSION: 1. Endotracheal tube 3.2 cm above the carina. Nasogastric tube tip within the gastric fundus. Electronically Signed   By: Helyn Numbers M.D.   On: 04/17/2023 01:25   DG Pelvis Portable  Result Date: 04/17/2023 CLINICAL DATA:  Fall, pelvic trauma EXAM: PORTABLE PELVIS 1-2 VIEWS COMPARISON:  None Available. 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 No acute fracture or dislocation. Bilateral common iliac artery stents noted. Vascular calcifications noted. IMPRESSION: 1. Bilateral total hip arthroplasty. No acute fracture or dislocation. Electronically Signed   By: Helyn Numbers M.D.   On: 04/17/2023 01:23    Review of Systems  Unable to perform ROS: Intubated   Blood pressure (!) 99/57, pulse 93, temperature (!) 96.3 F (35.7 C), resp. rate (!) 30, height 6' (1.829 m), weight 62.1 kg, SpO2 95 %. Physical Exam Neurological:     Comments: Patient awakens easily to voice moves all extremities well     Assessment/Plan: Hospital day 1 patient with chronic subdural fluid collection/hygromas nonacute mild acute compression deformities of C7-T1.  No neurosurgical intervention needed continue cervical collar for now patient is awake hopefully can be extubated soon.  Chad Winters 04/17/2023, 9:10 AM

## 2023-04-18 DIAGNOSIS — J9601 Acute respiratory failure with hypoxia: Secondary | ICD-10-CM

## 2023-04-18 LAB — TYPE AND SCREEN
Unit division: 0
Unit division: 0

## 2023-04-18 LAB — POCT I-STAT 7, (LYTES, BLD GAS, ICA,H+H)
Acid-Base Excess: 5 mmol/L — ABNORMAL HIGH (ref 0.0–2.0)
Bicarbonate: 26 mmol/L (ref 20.0–28.0)
Calcium, Ion: 0.9 mmol/L — ABNORMAL LOW (ref 1.15–1.40)
HCT: 19 % — ABNORMAL LOW (ref 39.0–52.0)
Hemoglobin: 6.5 g/dL — CL (ref 13.0–17.0)
O2 Saturation: 100 %
Potassium: 3.8 mmol/L (ref 3.5–5.1)
Sodium: 133 mmol/L — ABNORMAL LOW (ref 135–145)
TCO2: 27 mmol/L (ref 22–32)
pCO2 arterial: 25.2 mmHg — ABNORMAL LOW (ref 32–48)
pH, Arterial: 7.622 (ref 7.35–7.45)
pO2, Arterial: 138 mmHg — ABNORMAL HIGH (ref 83–108)

## 2023-04-18 LAB — HEMOGLOBIN AND HEMATOCRIT, BLOOD
HCT: 26.3 % — ABNORMAL LOW (ref 39.0–52.0)
Hemoglobin: 9.7 g/dL — ABNORMAL LOW (ref 13.0–17.0)

## 2023-04-18 LAB — CBC WITH DIFFERENTIAL/PLATELET
Abs Immature Granulocytes: 0.07 10*3/uL (ref 0.00–0.07)
Basophils Absolute: 0 10*3/uL (ref 0.0–0.1)
Basophils Relative: 0 %
Eosinophils Absolute: 0 10*3/uL (ref 0.0–0.5)
Eosinophils Relative: 0 %
HCT: 18.4 % — ABNORMAL LOW (ref 39.0–52.0)
Hemoglobin: 6.6 g/dL — CL (ref 13.0–17.0)
Immature Granulocytes: 1 %
Lymphocytes Relative: 12 %
Lymphs Abs: 1.4 10*3/uL (ref 0.7–4.0)
MCH: 30 pg (ref 26.0–34.0)
MCHC: 35.9 g/dL (ref 30.0–36.0)
MCV: 83.6 fL (ref 80.0–100.0)
Monocytes Absolute: 0.4 10*3/uL (ref 0.1–1.0)
Monocytes Relative: 4 %
Neutro Abs: 9.8 10*3/uL — ABNORMAL HIGH (ref 1.7–7.7)
Neutrophils Relative %: 83 %
Platelets: 141 10*3/uL — ABNORMAL LOW (ref 150–400)
RBC: 2.2 MIL/uL — ABNORMAL LOW (ref 4.22–5.81)
RDW: 17.6 % — ABNORMAL HIGH (ref 11.5–15.5)
WBC: 11.7 10*3/uL — ABNORMAL HIGH (ref 4.0–10.5)
nRBC: 0 % (ref 0.0–0.2)

## 2023-04-18 LAB — DIC (DISSEMINATED INTRAVASCULAR COAGULATION)PANEL
D-Dimer, Quant: 5.72 ug/mL-FEU — ABNORMAL HIGH (ref 0.00–0.50)
Fibrinogen: 333 mg/dL (ref 210–475)
INR: 1.6 — ABNORMAL HIGH (ref 0.8–1.2)
Platelets: 125 10*3/uL — ABNORMAL LOW (ref 150–400)
Prothrombin Time: 18.6 seconds — ABNORMAL HIGH (ref 11.4–15.2)
Smear Review: NONE SEEN
aPTT: 36 seconds (ref 24–36)

## 2023-04-18 LAB — CULTURE, BLOOD (ROUTINE X 2): Special Requests: ADEQUATE

## 2023-04-18 LAB — BASIC METABOLIC PANEL
Anion gap: 18 — ABNORMAL HIGH (ref 5–15)
BUN: 43 mg/dL — ABNORMAL HIGH (ref 8–23)
CO2: 21 mmol/L — ABNORMAL LOW (ref 22–32)
Calcium: 6.5 mg/dL — ABNORMAL LOW (ref 8.9–10.3)
Chloride: 92 mmol/L — ABNORMAL LOW (ref 98–111)
Creatinine, Ser: 3.06 mg/dL — ABNORMAL HIGH (ref 0.61–1.24)
GFR, Estimated: 21 mL/min — ABNORMAL LOW (ref >60)
Glucose, Bld: 154 mg/dL — ABNORMAL HIGH (ref 70–99)
Potassium: 3.9 mmol/L (ref 3.5–5.1)
Sodium: 131 mmol/L — ABNORMAL LOW (ref 135–145)

## 2023-04-18 LAB — GLUCOSE, CAPILLARY
Glucose-Capillary: 140 mg/dL — ABNORMAL HIGH (ref 70–99)
Glucose-Capillary: 156 mg/dL — ABNORMAL HIGH (ref 70–99)
Glucose-Capillary: 162 mg/dL — ABNORMAL HIGH (ref 70–99)
Glucose-Capillary: 153 mg/dL — ABNORMAL HIGH (ref 70–99)
Glucose-Capillary: 137 mg/dL — ABNORMAL HIGH (ref 70–99)
Glucose-Capillary: 138 mg/dL — ABNORMAL HIGH (ref 70–99)

## 2023-04-18 LAB — PREPARE RBC (CROSSMATCH)

## 2023-04-18 LAB — MAGNESIUM: Magnesium: 2 mg/dL (ref 1.7–2.4)

## 2023-04-18 LAB — PHOSPHORUS: Phosphorus: 4.1 mg/dL (ref 2.5–4.6)

## 2023-04-18 LAB — LACTIC ACID, PLASMA
Lactic Acid, Venous: 3.1 mmol/L (ref 0.5–1.9)
Lactic Acid, Venous: 2.3 mmol/L (ref 0.5–1.9)

## 2023-04-18 LAB — TRIGLYCERIDES: Triglycerides: 287 mg/dL — ABNORMAL HIGH (ref <150)

## 2023-04-18 MED ORDER — SODIUM CHLORIDE 0.9% IV SOLUTION: Freq: Once | INTRAVENOUS | Status: AC

## 2023-04-18 MED ORDER — PIPERACILLIN-TAZOBACTAM IN DEX 2-0.25 GM/50ML IV SOLN
2.2500 g | Freq: Four times a day (QID) | INTRAVENOUS | Status: DC
  Administered 2023-04-18 – 2023-04-19 (×4): 2.25 g via INTRAVENOUS
  Filled 2023-04-18 (×5): qty 50

## 2023-04-18 MED ORDER — MAGNESIUM SULFATE 2 GM/50ML IV SOLN
INTRAVENOUS | Status: AC
  Administered 2023-04-18: 2 g via INTRAVENOUS
  Filled 2023-04-18: qty 50

## 2023-04-18 MED ORDER — CALCIUM CHLORIDE 10 % IV SOLN: 1.0000 g | Freq: Once | INTRAVENOUS | Status: DC

## 2023-04-18 MED ORDER — INSULIN ASPART 100 UNIT/ML IJ SOLN
0.0000 [IU] | INTRAMUSCULAR | Status: DC
  Administered 2023-04-18: 1 [IU] via SUBCUTANEOUS
  Administered 2023-04-18: 2 [IU] via SUBCUTANEOUS
  Administered 2023-04-19 (×4): 1 [IU] via SUBCUTANEOUS
  Administered 2023-04-19: 2 [IU] via SUBCUTANEOUS
  Administered 2023-04-19 – 2023-04-22 (×4): 1 [IU] via SUBCUTANEOUS
  Administered 2023-04-22 (×3): 2 [IU] via SUBCUTANEOUS
  Administered 2023-04-22: 3 [IU] via SUBCUTANEOUS
  Administered 2023-04-22: 2 [IU] via SUBCUTANEOUS
  Administered 2023-04-23 (×3): 1 [IU] via SUBCUTANEOUS
  Administered 2023-04-23: 2 [IU] via SUBCUTANEOUS
  Administered 2023-04-25 – 2023-04-26 (×3): 1 [IU] via SUBCUTANEOUS

## 2023-04-18 MED ORDER — SODIUM BICARBONATE 8.4 % IV SOLN
100.0000 meq | Freq: Once | INTRAVENOUS | Status: AC
  Administered 2023-04-18: 100 meq via INTRAVENOUS

## 2023-04-18 MED ORDER — LINEZOLID 600 MG/300ML IV SOLN
600.0000 mg | Freq: Two times a day (BID) | INTRAVENOUS | Status: DC
  Administered 2023-04-19: 600 mg via INTRAVENOUS
  Filled 2023-04-18: qty 300

## 2023-04-18 MED ORDER — AMIODARONE IV BOLUS ONLY 150 MG/100ML
150.0000 mg | Freq: Once | INTRAVENOUS | Status: DC
  Filled 2023-04-18: qty 100

## 2023-04-18 MED ORDER — MAGNESIUM SULFATE 2 GM/50ML IV SOLN: 2.0000 g | Freq: Once | INTRAVENOUS | Status: AC

## 2023-04-18 MED ORDER — CALCIUM GLUCONATE-NACL 1-0.675 GM/50ML-% IV SOLN
INTRAVENOUS | Status: AC
  Filled 2023-04-18: qty 50

## 2023-04-18 MED ORDER — CALCIUM CHLORIDE 10 % IV SOLN
INTRAVENOUS | Status: AC
  Filled 2023-04-18: qty 10

## 2023-04-18 MED ORDER — FUROSEMIDE 10 MG/ML IJ SOLN
120.0000 mg | Freq: Once | INTRAVENOUS | Status: AC
  Administered 2023-04-18: 120 mg via INTRAVENOUS
  Filled 2023-04-18: qty 10

## 2023-04-18 MED ORDER — CALCIUM GLUCONATE-NACL 2-0.675 GM/100ML-% IV SOLN
2.0000 g | Freq: Once | INTRAVENOUS | Status: AC
  Administered 2023-04-18: 2000 mg via INTRAVENOUS
  Filled 2023-04-18: qty 100

## 2023-04-18 MED ORDER — SODIUM BICARBONATE 8.4 % IV SOLN
INTRAVENOUS | Status: AC
  Filled 2023-04-18: qty 100

## 2023-04-18 NOTE — Progress Notes (Signed)
eLink Physician-Brief Progress Note Patient Name: Quintell Matters DOB: 1946-11-01 MRN: [id number]   Date of Service  04/18/2023  HPI/Events of Note  Hgb 6.6  eICU Interventions  Transfuse 1 unit prbc        Henry Russel, P 04/18/2023, 6:20 AM

## 2023-04-18 NOTE — Progress Notes (Signed)
NAME:  Chad Winters, MRN:  [id number], DOB:  [birth year], LOS: 1 ADMISSION DATE:  04/17/2023, CONSULTATION DATE:  5/4 REFERRING MD:  Dr. Madilyn Hook, CHIEF COMPLAINT:  SDH/facial fx   History of Present Illness:  Patient is a 77 year old male with no history on file presents to [facility] ED on 5/4 with AMS.  5/4 patient found down in backyard.  EMS called and patient transported to [facility] as a level 1 trauma.  Patient w/ multiple lacerations on scalp. BP soft, brady 50s, and hypothermic 90.8 F. Patient given IV fluids and 2 units of blood for presumed bleeding. Patient MAE spontaneously but did not respond to stimuli or voice.  Due to poor GCS patient intubated. Cbg 102. EKG sinus brady w/ qtc prolongation. CXR no acute abnormality. CT head/c-spine showing acute fractures of c7 and t1; Hypodense collection on left frontoparietal 1.2 favoring a chronic SDH. NSG consulted. CT chest/abd/pelvis w/ prior whipple procedure w/ slight edema around pancreatic body and tail concerncing for acute interstitial pancreatitis; possible enteritis; and possible ileus vs. Low-grade obstruction of small bowel; low-density lesion in lower medial aspect of segment 6 of liver recommending MRI w/ and wo contrast. Ethanol level 122. Vbg 6.72, 30, 87, 4. Wbc 15.2, hgb 9.6. Given 2 amps of bicarb. UA unremarkable. Cultures obtained and patient started on broad spectrum abx. Patient remained hypotensive and started on levo. PCCM consulted.   Pertinent ED Labs: na 134, k 3.3, co2 <7, creat 2.03, AST 927, ALT 350  Pertinent  Medical History  No medical hx on file   Significant Hospital Events: Including procedures, antibiotic start and stop dates in addition to other pertinent events   5/4 patient found down w/ AMS; multiple head lacs and abrasions BLE and BUE; Intubated for airway protection; hypotensive on levo; pccm consulted  Interim History / Subjective:  Patient started making some urine, total output was 375 in last 24 hours Serum  creatinine continue to trend up Still remain on bicarbonate infusion Hemoglobin dropped down to 6.6 Remained afebrile  Objective   Blood pressure (!) 97/55, pulse 61, temperature 99.3 F (37.4 C), resp. rate 19, height 6' (1.829 m), weight 69.6 kg, SpO2 100 %.    Vent Mode: PRVC FiO2 (%):  [40 %-50 %] 40 % Set Rate:  [22 bmp] 22 bmp Vt Set:  [600 mL] 600 mL PEEP:  [5 cmH20] 5 cmH20 Plateau Pressure:  [18 cmH20-22 cmH20] 20 cmH20   Intake/Output Summary (Last 24 hours) at 04/18/2023 0946 Last data filed at 04/18/2023 0700 Gross per 24 hour  Intake 4484.16 ml  Output 292 ml  Net 4192.16 ml   Filed Weights   04/17/23 0204 04/17/23 0505 04/18/23 0500  Weight: 68 kg 62.1 kg 69.6 kg    Examination: Physical exam: General: Crtitically ill-appearing male, orally intubated HEENT: [place]/AT, eyes anicteric.  ETT and OGT in place.  Hard neck collar in place Neuro: Opens eyes with vocal stimuli, following simple commands, gets easily agitated and restless  Chest: Coarse breath sounds, no wheezes or rhonchi Heart: Regular rate and rhythm, no murmurs or gallops Abdomen: Soft, nondistended, bowel sounds present Skin: Bruise mark noted on right upper extremity, multiple abrasions anterior marks noted on bilateral lower extremities  Labs and images were reviewed  Resolved Hospital Problem list     Assessment & Plan:  Status post fall Acute C7 and T1 Fractures Head lacerations ABLA Chronic bilateral SDH Multiple abrasions/skin tears on BLE and BUE Neurosurgery recommend no surgical intervention Trauma team has  signed off Continue hard neck collar in place H&H dropped down to 6.6 Will transfuse 2 unit PRBCs Monitor and transfuse if less than 7 Continue c-collar Acute rhabdomyolysis was ruled out  Septic Shock likely due to acute enteritis Lactic acidosis/severe metabolic acidosis Patient continued to require vasopressor support Continue Levophed with map goal 65 Continue  broad-spectrum antibiotics Lactate started trending down Repeat lactate is pending Temperature has normalized Continue bicarbonate infusion  Acute metabolic/toxic encephalopathy In the setting of alcohol abuse and acute kidney injury CT head w/ no acute abnormality but with chronic bilateral subdural hematoma UDS showed no substance abuse Continue thiamine and folate Minimize sedation with RASS goal -1  Acute respiratory insufficiency in the setting of encephalopathy Patient was found unresponsive He was not able to protect his airway he was intubated Continue lung protective ventilation VAP prevention bundle in place CT chest showed left-sided small pleural effusion but no infiltrates Not ready for SBT  Oliguric acute kidney injury due to ischemic ATN in the setting of shock Hypokalemia Hypocalcemia Serum creatinine continue to trend up He started making some urine made 375 in the last 24 hours Will try Lasix challenge with 100 mg x 1 Continue aggressive electrolyte supplement  Possible pancreatitis: CT abd/pelvis showing prior whipple procedure w/ sligh edema around pancreatic body and tail concerncing for acute interstitial pancreatitis Lipase is slightly elevated Continue IV fluid  Shock liver/alcoholic hepatitis Liver lesion: CT showing low-density lesion in lower medial aspect of segment 6 of liver Will need MRI w/ and wo contrast, liver protocol once stable Monitor LFTs Monitor coagulation panel  Best Practice (right click and "Reselect all SmartList Selections" daily)   Diet/type: NPO DVT prophylaxis: SCD GI prophylaxis: PPI Lines: N/A Foley:  Yes, and it is still needed Code Status:  full code Last date of multidisciplinary goals of care discussion [5/4: Patient's family was updated at bedside  Labs   CBC: Recent Labs  Lab 04/17/23 0200 04/17/23 0250 04/17/23 0527 04/17/23 1106 04/17/23 1123 04/18/23 0503  WBC 15.2*  --   --   --  10.4 11.7*   NEUTROABS  --   --   --   --   --  9.8*  HGB 9.6* 9.2* 8.5* 7.1* 7.3* 6.6*  HCT 31.1* 27.0* 25.0* 21.0* 20.8* 18.4*  MCV 98.1  --   --   --  85.2 83.6  PLT 189  --   --   --  153 125*  141*    Basic Metabolic Panel: Recent Labs  Lab 04/17/23 0107 04/17/23 0200 04/17/23 0250 04/17/23 0415 04/17/23 0527 04/17/23 1106 04/17/23 1123 04/17/23 1632 04/18/23 0501 04/18/23 0503  NA 133*  132* 134*   < >  --  133* 131* 132* 133* 131*  --   K 5.4*  5.4* 3.3*   < >  --  3.9 3.9 3.8 3.3* 3.9  --   CL 108 98  --   --   --   --  89* 92* 92*  --   CO2  --  <7*  --   --   --   --  18* 22 21*  --   GLUCOSE 87 83  --   --   --   --  164* 162* 154*  --   BUN 30* 28*  --   --   --   --  32* 35* 43*  --   CREATININE 2.20* 2.03*  --   --   --   --  2.48* 2.63* 3.06*  --   CALCIUM  --  7.4*  --   --   --   --  6.6* 7.1* 6.5*  --   MG  --   --   --  2.6*  --   --   --   --   --  2.0  PHOS  --   --   --   --   --   --   --   --   --  4.1   < > = values in this interval not displayed.   GFR: Estimated Creatinine Clearance: 20.5 mL/min (A) (by C-G formula based on SCr of 3.06 mg/dL (H)). Recent Labs  Lab 04/17/23 0200 04/17/23 0249 04/17/23 0415 04/17/23 0652 04/17/23 1123 04/17/23 1328 04/18/23 0503  PROCALCITON  --   --  0.13  --   --   --   --   WBC 15.2*  --   --   --  10.4  --  11.7*  LATICACIDVEN  --    < > >9.0* >9.0* >9.0* 6.5*  --    < > = values in this interval not displayed.    Liver Function Tests: Recent Labs  Lab 04/17/23 0200 04/17/23 1123  AST 927* 3,014*  ALT 350* 1,017*  ALKPHOS 111 111  BILITOT 0.4 1.0  PROT 4.7* 3.9*  ALBUMIN 1.8* 1.5*   Recent Labs  Lab 04/17/23 0415  LIPASE 197*   Recent Labs  Lab 04/17/23 0415  AMMONIA 143*    ABG    Component Value Date/Time   PHART 7.529 (H) 04/17/2023 1106   PCO2ART 19.4 (LL) 04/17/2023 1106   PO2ART 76 (L) 04/17/2023 1106   HCO3 16.2 (L) 04/17/2023 1106   TCO2 17 (L) 04/17/2023 1106   ACIDBASEDEF  6.0 (H) 04/17/2023 1106   O2SAT 97 04/17/2023 1106     Coagulation Profile: Recent Labs  Lab 04/17/23 0652 04/18/23 0503  INR 1.6* 1.6*    Cardiac Enzymes: Recent Labs  Lab 04/17/23 0415  CKTOTAL 359    HbA1C: No results found for: "HGBA1C"  CBG: Recent Labs  Lab 04/17/23 1625 04/17/23 2002 04/17/23 2318 04/18/23 0254 04/18/23 0803  GLUCAP 155* 125* 99 140* 156*    This patient is critically ill with multiple organ system failure which requires frequent high complexity decision making, assessment, support, evaluation, and titration of therapies. This was completed through the application of advanced monitoring technologies and extensive interpretation of multiple databases.  During this encounter critical care time was devoted to patient care services described in this note for 39 minutes.    Cheri Fowler, MD [facility] See Amion for pager If no response to pager, please call [phone] until 7pm After 7pm, Please call E-link [phone]

## 2023-04-18 NOTE — Progress Notes (Signed)
Subjective: NAEs o/n  Objective: Vital signs in last 24 hours: Temp:  [97.3 F (36.3 C)-100.9 F (38.3 C)] 99.3 F (37.4 C) (05/05 0845) Pulse Rate:  [59-96] 61 (05/05 0845) Resp:  [11-30] 19 (05/05 0845) BP: (70-141)/(46-84) 97/55 (05/05 0845) SpO2:  [94 %-100 %] 100 % (05/05 0845) FiO2 (%):  [40 %-50 %] 40 % (05/05 0749) Weight:  [69.6 kg] 69.6 kg (05/05 0500)  Intake/Output from previous day: 05/04 0701 - 05/05 0700 In: 5195.5 [I.V.:3085.7; NG/GT:857.3; IV Piggyback:1252.5] Out: 337 [Urine:337] Intake/Output this shift: No intake/output data recorded.  Intubated Eyes open to voice, PERRL FC x 4 and centrally C-collar in place  Lab Results: Recent Labs    04/17/23 1123 04/18/23 0503  WBC 10.4 11.7*  HGB 7.3* 6.6*  HCT 20.8* 18.4*  PLT 153 125*  141*   BMET Recent Labs    04/17/23 1632 04/18/23 0501  NA 133* 131*  K 3.3* 3.9  CL 92* 92*  CO2 22 21*  GLUCOSE 162* 154*  BUN 35* 43*  CREATININE 2.63* 3.06*  CALCIUM 7.1* 6.5*    Studies/Results: US Abdomen Limited RUQ (LIVER/GB)  Result Date: 04/17/2023 CLINICAL DATA:  Elevated liver enzymes. EXAM: ULTRASOUND ABDOMEN LIMITED RIGHT UPPER QUADRANT COMPARISON:  CT with IV contrast earlier today. FINDINGS: Gallbladder: Surgically absent. Common bile duct: Diameter: Within normal limits.  Measures 4 mm. Liver: No focal mass identified. There is increased echogenicity consistent with fatty replacement as noted on CT. Corresponding to the low-density lesion in the lower medial right lobe of the liver noted on CT and above the Hounsfield density of simple fluid, there is a thin walled anechoic uncomplicated simple cyst measuring 2.3 x 2.1 x 1.7 cm. No other focal abnormality is seen in the liver. Portal vein is patent on color Doppler imaging with normal direction of blood flow towards the liver. Other: There is trace perihepatic free fluid. IMPRESSION: 1. No acute findings in the right upper quadrant. 2. Hepatic  steatosis. 3. Simple anechoic cyst in the right lobe of the liver. 4. Trace perihepatic free fluid. Electronically Signed   By: Almira Bar M.D.   On: 04/17/2023 07:32   CT CHEST ABDOMEN PELVIS W CONTRAST  Addendum Date: 04/17/2023   ADDENDUM REPORT: 04/17/2023 02:55 ADDENDUM: Close clinical follow-up and monitoring of renal function is recommended. Electronically Signed   By: Almira Bar M.D.   On: 04/17/2023 02:55   Result Date: 04/17/2023 CLINICAL DATA:  Fall injury with blunt polytrauma. EXAM: CT CHEST, ABDOMEN, AND PELVIS WITH CONTRAST TECHNIQUE: Multidetector CT imaging of the chest, abdomen and pelvis was performed following the standard protocol during bolus administration of intravenous contrast. RADIATION DOSE REDUCTION: This exam was performed according to the departmental dose-optimization program which includes automated exposure control, adjustment of the mA and/or kV according to patient size and/or use of iterative reconstruction technique. CONTRAST:  75mL OMNIPAQUE IOHEXOL 350 MG/ML SOLN COMPARISON:  CT chest, abdomen and pelvis with contrast 09/21/2022, and CTA chest, abdomen and pelvis 06/27/2022. Both of these were performed at [facility] and therefore reports for these studies are unavailable. FINDINGS: CT CHEST FINDINGS Cardiovascular: The cardiac size is stable, slightly enlarged, with again noted apical myocardial thinning and fatty infiltration consistent with a chronic apical infarct. There are similar chronic changes to the left lateral ventricular wall as well. The pulmonary veins are decompressed. There are moderate to heavy three-vessel coronary artery calcifications. The pulmonary arteries are normal in caliber and are centrally clear on  this nondedicated exam. Thoracic aorta is tortuous, with scattered calcification along the valve leaflets. There is moderate to heavy calcific plaque in the aorta and great vessels but no appreciable great vessel  stenosis greater than 50%. There is a patent normal variant origin of left vertebral artery from the aortic arch. There is no aortic aneurysm, stenosis or dissection. Mediastinum/Nodes: ETT is in place with tip 2.4 cm from the carina. The trachea is clear. The main bronchi are clear. NGT is in place entering the stomach and directed to the left with the tip abutting the wall of the proximal fundus. There is retained fluid in the mid to distal esophagus versus reflux. No wall thickening. There is heterogeneous thyroid without a visible focal nodule. No axillary or intrathoracic adenopathy is seen. Lungs/Pleura: There is a small low-density layering left pleural effusion. There is no right pleural effusion. There is no pneumothorax. There is mild posterior atelectasis in the lung bases. Scattered chronic linear scar-like opacities in the lower lobes. There is a calcified granuloma in the right middle lobe laterally. No noncalcified nodules, contusions or infiltrates are seen. Musculoskeletal: There are mild acute anterior wedge compression fractures of the C7 and T1 vertebral bodies. No spinal canal hematoma is seen. There is mild wedging of the T3 vertebral body which was noted previously and is unchanged. There are multilevel thoracic spine bridging syndesmophytes, contiguously from down to T12 where there is a again noted a mild chronic anterior wedge compression deformity. There are subacute partially healed fractures of the right posterior tenth and eleventh ribs and chronic healed fracture deformities of the right posterolateral sixth and seventh ribs. On the left, there are partially healed subacute fractures of the posterior second rib, of the posterior eleventh and twelfth ribs and of the posterolateral tenth rib, with chronic healed overriding fracture deformity of the anterior second rib. No acute displaced rib fracture is suspected. CT ABDOMEN PELVIS FINDINGS Hepatobiliary: In the lower medial aspect of  hepatic segment 5, there is an ovoid hypodense lesion again noted, larger than on 09/21/2022 and was not seen on 06/27/2022. This currently measures 2.7 x 1.6 cm with a Hounsfield density of 37 and previously measured 2.3 x 1.3 cm. There is a 5 mm stable too small to characterize hypodensity anteriorly in segment 3 on 3:59. MRI without and with contrast is recommended for further evaluation. Remaining liver is homogeneous with no evidence of an acute injury or perihepatic hematoma. Gallbladder is absent and there is no biliary dilatation. Pancreas: The head and neck of pancreas have been previously removed and there are surgical clips in the area. There is either a pancreaticojejunostomy or pancreaticoduodenostomy anterior to the pancreatic resection. There is slight edema around the body and tail of the pancreas concerning for acute interstitial pancreatitis. There is increased pancreatic ductal prominence now measuring 4 mm. No mass is seen. Spleen: No mass enhancement or acute injury are suspected. Adrenals/Urinary Tract: There is no adrenal mass. Both kidneys are abnormal compared to prior studies with bilateral diffuse patchy hypoenhancement which could be due to multifocal infarctions, contrast nephrotoxicity or an infectious process. Multiple tiny too small to characterize hypodensities were previously noted in the kidneys but are obscured in the areas of hypoenhancement today. There is no urinary stone or obstruction. The posterior half of the bladder is obscured by spray artifact from bilateral hip replacements. The anterior bladder is mildly thickened which was seen on both of the prior studies and could be due to hypertrophy or cystitis.  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 Thickened folds are again noted in the left abdominal small bowel which were noted previously. There are mildly dilated mid abdominal small bowel segments up to 3.2 cm without  visible transition and could be due to ileus or low-grade obstruction. Rest of the small bowel is normal caliber. An appendix is not seen in this patient. There is diverticulosis without evidence of diverticulitis. Vascular/Lymphatic: There is extensive heavy aortic, iliac and branch vessel atherosclerosis including both renal arteries. No AAA is seen. The pelvic sidewalls are obscured by the hip replacements, otherwise, there is no visible adenopathy. Reproductive: The prostate is obscured by the hip replacements but it was previously not enlarged. Other: There is minimal ascites in the abdomen. No ascites in the pelvis. There are mild mesenteric congestive changes. Musculoskeletal: Osteopenia. No lumbar compression fracture. No regional skeletal fracture is seen. Right hip replacement is new from 09/21/2022. IMPRESSION: 1. Mild acute anterior wedge compression fractures of the C7 and T1 vertebral bodies. 2. No other acute trauma related findings in the chest, abdomen or pelvis. 3. Small left pleural effusion. 4. 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 5. Aortic and coronary artery atherosclerosis. 6. Retained fluid in the mid to distal esophagus versus reflux. 7. Prior Whipple procedure with slight edema around the body and tail of the pancreas concerning for acute interstitial pancreatitis, with increased prominence of the pancreatic duct since the last CT. 8. Interval development of diffuse patchy hypoenhancement of both kidneys which could be due to multifocal infarctions, contrast nephrotoxicity or an infectious process. 9. Increased size of a homogeneous low-density lesion in the lower medial aspect of segment 5 of the liver. MRI without and with contrast is recommended. 10. Thickened folds in the left abdominal small bowel which could be due to enteritis, with mildly dilated mid abdominal small bowel segments up to 3.2 cm without visible transition. This could be due to ileus or low-grade  obstruction. 11. Minimal ascites and mild mesenteric edema. 12. Osteopenia and degenerative change. 13. Critical Value/emergent results were called by telephone at the time of interpretation on 04/17/2023 at 1:51 am to provider Dr. Cliffton Asters, who verbally acknowledged these results. Electronically Signed: By: Almira Bar M.D. On: 04/17/2023 02:33   CT HEAD WO CONTRAST  Result Date: 04/17/2023 CLINICAL DATA:  Trauma EXAM: CT HEAD WITHOUT CONTRAST CT CERVICAL SPINE WITHOUT CONTRAST TECHNIQUE: Multidetector CT imaging of the head and cervical spine was performed following the standard protocol without intravenous contrast. Multiplanar CT image reconstructions of the cervical spine were also generated. RADIATION DOSE REDUCTION: This exam was performed according to the departmental dose-optimization program which includes automated exposure control, adjustment of the mA and/or kV according to patient size and/or use of iterative reconstruction technique. COMPARISON:  03/04/2022 CT head and cervical spine, CT chest 09/21/2022 FINDINGS: CT HEAD FINDINGS Brain: Hypodense collection overlying the left frontoparietal convexity measures up to 1.2 cm (series 5, image 49). Interval decrease in the size of the previously noted right frontal convexity collection, which measures up to 0.5 cm, previously 0.8 cm when remeasured similarly. No evidence of acute infarct, hemorrhage, mass, mass effect, or midline shift. No hydrocephalus. Small focus of encephalomalacia in the right frontal lobe is unchanged. Vascular: No hyperdense vessel. Atherosclerotic calcifications in the intracranial carotid and vertebral arteries. Skull: Negative for fracture or focal lesion. Biparietal and bifrontal burr holes. Sinuses/Orbits: No acute finding. Status post bilateral lens replacements. Other: The mastoid air cells  are well aerated. CT CERVICAL SPINE FINDINGS Alignment: No traumatic listhesis. Skull base and vertebrae: Acute fractures of C7 and T1,  with 15% vertebral body height loss at C7 and 20% vertebral body height loss at T1. The T1 fracture appears to extend from the anterior through the posterior cortex, without significant retropulsion. The C7 fracture is more anterior than posterior. Soft tissues and spinal canal: No significant prevertebral fluid or swelling. No visible canal hematoma. Disc levels: Degenerative changes in the cervical spine. No significant spinal canal stenosis. Upper chest: For findings in the thorax, please see same day CT chest. IMPRESSION: 1. Acute fractures of C7 and T1, with 15% vertebral body height loss at C7 and 20% vertebral body height loss at T1. The T1 fracture appears to extend from the anterior through the posterior cortex, without significant retropulsion. 2. 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 3. 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 4. No additional acute intracranial process. Imaging results were communicated on 04/17/2023 at 1:52 am to provider WHITE via secure text paging. Electronically Signed   By: Wiliam Ke M.D.   On: 04/17/2023 01:53   CT CERVICAL SPINE WO CONTRAST  Result Date: 04/17/2023 CLINICAL DATA:  Trauma EXAM: CT HEAD WITHOUT CONTRAST CT CERVICAL SPINE WITHOUT CONTRAST TECHNIQUE: Multidetector CT imaging of the head and cervical spine was performed following the standard protocol without intravenous contrast. Multiplanar CT image reconstructions of the cervical spine were also generated. RADIATION DOSE REDUCTION: This exam was performed according to the departmental dose-optimization program which includes automated exposure control, adjustment of the mA and/or kV according to patient size and/or use of iterative reconstruction technique. COMPARISON:  03/04/2022 CT head and cervical spine, CT  chest 09/21/2022 FINDINGS: CT HEAD FINDINGS Brain: Hypodense collection overlying the left frontoparietal convexity measures up to 1.2 cm (series 5, image 49). Interval decrease in the size of the previously noted right frontal convexity collection, which measures up to 0.5 cm, previously 0.8 cm when remeasured similarly. No evidence of acute infarct, hemorrhage, mass, mass effect, or midline shift. No hydrocephalus. Small focus of encephalomalacia in the right frontal lobe is unchanged. Vascular: No hyperdense vessel. Atherosclerotic calcifications in the intracranial carotid and vertebral arteries. Skull: Negative for fracture or focal lesion. Biparietal and bifrontal burr holes. Sinuses/Orbits: No acute finding. Status post bilateral lens replacements. Other: The mastoid air cells are well aerated. CT CERVICAL SPINE FINDINGS Alignment: No traumatic listhesis. Skull base and vertebrae: Acute fractures of C7 and T1, with 15% vertebral body height loss at C7 and 20% vertebral body height loss at T1. The T1 fracture appears to extend from the anterior through the posterior cortex, without significant retropulsion. The C7 fracture is more anterior than posterior. Soft tissues and spinal canal: No significant prevertebral fluid or swelling. No visible canal hematoma. Disc levels: Degenerative changes in the cervical spine. No significant spinal canal stenosis. Upper chest: For findings in the thorax, please see same day CT chest. IMPRESSION: 1. Acute fractures of C7 and T1, with 15% vertebral body height loss at C7 and 20% vertebral body height loss at T1. The T1 fracture appears to extend from the anterior through the posterior cortex, without significant retropulsion. 2. 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 3. Interval decrease in the previously  noted chronic right frontal convexity subdural hematoma, which measures  up to 0.5 cm, previously 0.8 cm. 4. No additional acute intracranial process. Imaging results were communicated on 04/17/2023 at 1:52 am to provider WHITE via secure text paging. Electronically Signed   By: Wiliam Ke M.D.   On: 04/17/2023 01:53   DG Chest Port 1 View  Result Date: 04/17/2023 CLINICAL DATA:  Fall, chest trauma EXAM: PORTABLE CHEST 1 VIEW COMPARISON:  None Available. FINDINGS: Left costophrenic angle excluded from view. Endotracheal tube 3.2 cm above the carina. Nasogastric tube tip within the gastric fundus. Visualized lungs are clear. No pneumothorax. No pleural effusion. Cardiac size within normal limits. Pulmonary vascularity is normal. Healed right rib fractures noted. No acute bone abnormality. IMPRESSION: 1. Endotracheal tube 3.2 cm above the carina. Nasogastric tube tip within the gastric fundus. Electronically Signed   By: Helyn Numbers M.D.   On: 04/17/2023 01:25   DG Pelvis Portable  Result Date: 04/17/2023 CLINICAL DATA:  Fall, pelvic trauma EXAM: PORTABLE PELVIS 1-2 VIEWS COMPARISON:  None Available. 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 No acute fracture or dislocation. Bilateral common iliac artery stents noted. Vascular calcifications noted. IMPRESSION: 1. Bilateral total hip arthroplasty. No acute fracture or dislocation. Electronically Signed   By: Helyn Numbers M.D.   On: 04/17/2023 01:23    Assessment/Plan: 77 yo M s/p fall from ladder, mild C7 and T1 compression fxs and chronic small bilateral subdural hygromas/hematomas - continue C-collar - f/u in neurosurgery clinic in 4 weeks with x-ray C-spine  LOS: 1 day     Bedelia Person 04/18/2023, 9:54 AM

## 2023-04-18 NOTE — Progress Notes (Signed)
Blood consent received from daughter, Dorina Hoyer, at bedside. Placed in shadow chart.   Daughter's number also updated in Epic.

## 2023-04-19 LAB — CBC WITH DIFFERENTIAL/PLATELET
Abs Immature Granulocytes: 0.09 10*3/uL — ABNORMAL HIGH (ref 0.00–0.07)
Basophils Absolute: 0 10*3/uL (ref 0.0–0.1)
Basophils Relative: 0 %
Eosinophils Absolute: 0.2 10*3/uL (ref 0.0–0.5)
Eosinophils Relative: 2 %
HCT: 27.7 % — ABNORMAL LOW (ref 39.0–52.0)
Hemoglobin: 9.5 g/dL — ABNORMAL LOW (ref 13.0–17.0)
Immature Granulocytes: 1 %
Lymphocytes Relative: 13 %
Lymphs Abs: 1.4 10*3/uL (ref 0.7–4.0)
MCH: 29 pg (ref 26.0–34.0)
MCHC: 34.3 g/dL (ref 30.0–36.0)
MCV: 84.5 fL (ref 80.0–100.0)
Monocytes Absolute: 0.6 10*3/uL (ref 0.1–1.0)
Monocytes Relative: 6 %
Neutro Abs: 8.1 10*3/uL — ABNORMAL HIGH (ref 1.7–7.7)
Neutrophils Relative %: 78 %
Platelets: 108 10*3/uL — ABNORMAL LOW (ref 150–400)
RBC: 3.28 MIL/uL — ABNORMAL LOW (ref 4.22–5.81)
RDW: 16.9 % — ABNORMAL HIGH (ref 11.5–15.5)
WBC: 10.5 10*3/uL (ref 4.0–10.5)
nRBC: 0 % (ref 0.0–0.2)

## 2023-04-19 LAB — TYPE AND SCREEN
Antibody Screen: NEGATIVE
Unit division: 0
Unit division: 0
Unit division: 0
Unit division: 0
Unit division: 0
Unit division: 0
Unit division: 0
Unit division: 0
Unit division: 0
Unit division: 0
Unit division: 0
Unit division: 0
Unit division: 0
Unit division: 0

## 2023-04-19 LAB — BASIC METABOLIC PANEL
Anion gap: 15 (ref 5–15)
BUN: 53 mg/dL — ABNORMAL HIGH (ref 8–23)
CO2: 33 mmol/L — ABNORMAL HIGH (ref 22–32)
Calcium: 6.5 mg/dL — ABNORMAL LOW (ref 8.9–10.3)
Chloride: 88 mmol/L — ABNORMAL LOW (ref 98–111)
Creatinine, Ser: 3.56 mg/dL — ABNORMAL HIGH (ref 0.61–1.24)
GFR, Estimated: 17 mL/min — ABNORMAL LOW (ref >60)
Glucose, Bld: 186 mg/dL — ABNORMAL HIGH (ref 70–99)
Potassium: 3.3 mmol/L — ABNORMAL LOW (ref 3.5–5.1)
Sodium: 136 mmol/L (ref 135–145)

## 2023-04-19 LAB — POCT I-STAT 7, (LYTES, BLD GAS, ICA,H+H)
Acid-Base Excess: 13 mmol/L — ABNORMAL HIGH (ref 0.0–2.0)
Bicarbonate: 36.1 mmol/L — ABNORMAL HIGH (ref 20.0–28.0)
Calcium, Ion: 0.88 mmol/L — CL (ref 1.15–1.40)
HCT: 27 % — ABNORMAL LOW (ref 39.0–52.0)
Hemoglobin: 9.2 g/dL — ABNORMAL LOW (ref 13.0–17.0)
O2 Saturation: 98 %
Potassium: 3.4 mmol/L — ABNORMAL LOW (ref 3.5–5.1)
Sodium: 135 mmol/L (ref 135–145)
TCO2: 37 mmol/L — ABNORMAL HIGH (ref 22–32)
pCO2 arterial: 39.9 mmHg (ref 32–48)
pH, Arterial: 7.564 — ABNORMAL HIGH (ref 7.35–7.45)
pO2, Arterial: 91 mmHg (ref 83–108)

## 2023-04-19 LAB — GLUCOSE, CAPILLARY
Glucose-Capillary: 131 mg/dL — ABNORMAL HIGH (ref 70–99)
Glucose-Capillary: 154 mg/dL — ABNORMAL HIGH (ref 70–99)
Glucose-Capillary: 132 mg/dL — ABNORMAL HIGH (ref 70–99)
Glucose-Capillary: 117 mg/dL — ABNORMAL HIGH (ref 70–99)
Glucose-Capillary: 124 mg/dL — ABNORMAL HIGH (ref 70–99)
Glucose-Capillary: 133 mg/dL — ABNORMAL HIGH (ref 70–99)

## 2023-04-19 LAB — BPAM RBC
Blood Product Expiration Date: 202405222359
Blood Product Expiration Date: 202405222359
Blood Product Expiration Date: 202405232359
Blood Product Expiration Date: 202405242359
Blood Product Expiration Date: 202406032359
Blood Product Expiration Date: 202406032359
Blood Product Expiration Date: 202406032359
Blood Product Expiration Date: 202406052359
Blood Product Expiration Date: 202406052359
Blood Product Expiration Date: 202406052359
Blood Product Expiration Date: 202406052359
Blood Product Expiration Date: 202406052359
Blood Product Expiration Date: 202406052359
Blood Product Expiration Date: 202406052359
Blood Product Expiration Date: 202406052359
Blood Product Expiration Date: 202406052359
ISSUE DATE / TIME: 202405040052
ISSUE DATE / TIME: 202405040053
ISSUE DATE / TIME: 202405040100
ISSUE DATE / TIME: 202405040100
ISSUE DATE / TIME: 202405040100
ISSUE DATE / TIME: 202405040100
ISSUE DATE / TIME: 202405040100
ISSUE DATE / TIME: 202405040100
ISSUE DATE / TIME: 202405040100
ISSUE DATE / TIME: 202405040100
ISSUE DATE / TIME: 202405040113
ISSUE DATE / TIME: 202405040719
ISSUE DATE / TIME: 202405040805
ISSUE DATE / TIME: 202405042135
ISSUE DATE / TIME: 202405051017
ISSUE DATE / TIME: 202405051017
Unit Type and Rh: 5100
Unit Type and Rh: 5100
Unit Type and Rh: 5100
Unit Type and Rh: 5100
Unit Type and Rh: 5100
Unit Type and Rh: 5100
Unit Type and Rh: 5100
Unit Type and Rh: 5100
Unit Type and Rh: 5100
Unit Type and Rh: 5100
Unit Type and Rh: 5100
Unit Type and Rh: 5100
Unit Type and Rh: 5100
Unit Type and Rh: 5100
Unit Type and Rh: 6200
Unit Type and Rh: 6200

## 2023-04-19 LAB — TRIGLYCERIDES: Triglycerides: 98 mg/dL (ref <150)

## 2023-04-19 LAB — MAGNESIUM: Magnesium: 2.5 mg/dL — ABNORMAL HIGH (ref 1.7–2.4)

## 2023-04-19 LAB — PHOSPHORUS: Phosphorus: 2.4 mg/dL — ABNORMAL LOW (ref 2.5–4.6)

## 2023-04-19 LAB — CALCIUM, IONIZED: Calcium, Ionized, Serum: 4.2 mg/dL — ABNORMAL LOW (ref 4.5–5.6)

## 2023-04-19 LAB — URINE CULTURE: Culture: NO GROWTH

## 2023-04-19 MED ORDER — POTASSIUM CHLORIDE 10 MEQ/100ML IV SOLN
10.0000 meq | INTRAVENOUS | Status: AC
  Administered 2023-04-19 (×4): 10 meq via INTRAVENOUS
  Filled 2023-04-19 (×4): qty 100

## 2023-04-19 MED ORDER — MUPIROCIN 2 % EX OINT
TOPICAL_OINTMENT | Freq: Two times a day (BID) | CUTANEOUS | Status: DC
  Administered 2023-04-23 – 2023-04-27 (×5): 1 via TOPICAL
  Filled 2023-04-19 (×5): qty 22

## 2023-04-19 MED ORDER — PIPERACILLIN-TAZOBACTAM 3.375 G IVPB
3.3750 g | Freq: Two times a day (BID) | INTRAVENOUS | Status: DC
  Administered 2023-04-19 – 2023-04-21 (×4): 3.375 g via INTRAVENOUS
  Filled 2023-04-19 (×4): qty 50

## 2023-04-19 MED ORDER — HYDROGEN PEROXIDE 3 % EX SOLN
Freq: Two times a day (BID) | CUTANEOUS | Status: DC
  Administered 2023-04-23 – 2023-04-27 (×4): 1 via TOPICAL
  Filled 2023-04-19: qty 473

## 2023-04-19 MED ORDER — CALCIUM GLUCONATE-NACL 2-0.675 GM/100ML-% IV SOLN
2.0000 g | Freq: Once | INTRAVENOUS | Status: AC
  Administered 2023-04-19: 2000 mg via INTRAVENOUS
  Filled 2023-04-19: qty 100

## 2023-04-19 NOTE — Progress Notes (Signed)
PHARMACY NOTE:  ANTIMICROBIAL RENAL DOSAGE ADJUSTMENT  Current antimicrobial regimen includes a mismatch between antimicrobial dosage and estimated renal function.  As per policy approved by the Pharmacy & Therapeutics and Medical Executive Committees, the antimicrobial dosage will be adjusted accordingly.  Current antimicrobial dosage:  Zosyn 2.25 Q6H   Indication: Sepsis   Renal Function:  Estimated Creatinine Clearance: 17.6 mL/min (A) (by C-G formula based on SCr of 3.56 mg/dL (H)).    Antimicrobial dosage has been changed to:  Zosyn 3.75g over 4 hours every 12 hours.   Thank you for allowing pharmacy to be a part of this patient's care.  Estill Batten, PharmD, BCCCP  04/19/2023 8:17 AM

## 2023-04-19 NOTE — Progress Notes (Signed)
Transition of Care [facility]) Screening Note   Patient Details  Name: Chad Winters Date of Birth: 02-07-1946   Transition of Care [facility]) CM/SW Contact:    Mearl Latin, LCSW Phone Number: 04/19/2023, 4:32 PM    Transition of Care Department [facility]) has reviewed patient from home alone. We will continue to monitor patient advancement through interdisciplinary progression rounds. If new patient transition needs arise, please place a TOC consult.

## 2023-04-19 NOTE — Progress Notes (Addendum)
NAME:  Chad Winters, MRN:  [id number], DOB:  03-25-1946, LOS: 2 ADMISSION DATE:  04/17/2023, CONSULTATION DATE:  5/4 REFERRING MD:  Dr. Madilyn Hook, CHIEF COMPLAINT:  SDH/facial fx   History of Present Illness:  Patient is a 77 year old male with no history on file presents to [facility] ED on 5/4 with AMS.  5/4 patient found down in backyard.  EMS called and patient transported to [facility] as a level 1 trauma.  Patient w/ multiple lacerations on scalp. BP soft, brady 50s, and hypothermic 90.8 F. Patient given IV fluids and 2 units of blood for presumed bleeding. Patient MAE spontaneously but did not respond to stimuli or voice.  Due to poor GCS patient intubated. Cbg 102. EKG sinus brady w/ qtc prolongation. CXR no acute abnormality. CT head/c-spine showing acute fractures of c7 and t1; Hypodense collection on left frontoparietal 1.2 favoring a chronic SDH. NSG consulted. CT chest/abd/pelvis w/ prior whipple procedure w/ slight edema around pancreatic body and tail concerncing for acute interstitial pancreatitis; possible enteritis; and possible ileus vs. Low-grade obstruction of small bowel; low-density lesion in lower medial aspect of segment 6 of liver recommending MRI w/ and wo contrast. Ethanol level 122. Vbg 6.72, 30, 87, 4. Wbc 15.2, hgb 9.6. Given 2 amps of bicarb. UA unremarkable. Cultures obtained and patient started on broad spectrum abx. Patient remained hypotensive and started on levo. PCCM consulted.   Pertinent ED Labs: na 134, k 3.3, co2 <7, creat 2.03, AST 927, ALT 350  Pertinent  Medical History  No medical hx on file   Significant Hospital Events: Including procedures, antibiotic start and stop dates in addition to other pertinent events   5/4 patient found down w/ AMS; multiple head lacs and abrasions BLE and BUE; Intubated for airway protection; hypotensive on levo; pccm consulted  Interim History / Subjective:  Patient made 1.5 L of urine yesterday after Lasix challenge Remained  afebrile Did not tolerate spontaneous breathing trial due to apnea Sedation was turned off Received 2 units of PRBCs  Objective   Blood pressure 104/60, pulse (!) 57, temperature 98.4 F (36.9 C), resp. rate 16, height 6' (1.829 m), weight 69.6 kg, SpO2 100 %.    Vent Mode: PRVC FiO2 (%):  [35 %-40 %] 35 % Set Rate:  [16 bmp-22 bmp] 16 bmp Vt Set:  [540 mL-600 mL] 540 mL PEEP:  [5 cmH20] 5 cmH20 Plateau Pressure:  [15 cmH20-20 cmH20] 19 cmH20   Intake/Output Summary (Last 24 hours) at 04/19/2023 0728 Last data filed at 04/19/2023 0600 Gross per 24 hour  Intake 5432.53 ml  Output 1555 ml  Net 3877.53 ml   Filed Weights   04/17/23 0204 04/17/23 0505 04/18/23 0500  Weight: 68 kg 62.1 kg 69.6 kg    Examination:   Physical exam: General: Crtitically ill-appearing male, orally intubated HEENT: [place]/AT, eyes anicteric.  ETT and OGT in place.  C-collar in place Neuro: Opens eyes with vocal stimuli, following simple commands, moving all 4 extremities Chest: Coarse breath sounds, no wheezes or rhonchi Heart: Regular rate and rhythm, no murmurs or gallops Abdomen: Soft, nondistended, bowel sounds present Skin: Bruise/abrasions marks noted on bilateral lower extremities and upper extremities  Labs and images were reviewed  Resolved Hospital Problem list     Assessment & Plan:  Status post fall Acute C7 and T1 Fractures Head lacerations ABLA Chronic bilateral SDH Multiple abrasions/skin tears on BLE and BUE Neurosurgery recommend no surgical intervention, outpatient follow-up in 4 weeks Continue c-collar H&H dropped down  to 6.6, received 2 units PRBCs Repeat hemoglobin 9.5 Monitor and transfuse if less than 7  Septic Shock likely due to acute enteritis Lactic acidosis/severe metabolic acidosis Iatrogenic metabolic alkalosis Patient continued to require low-dose vasopressor support Currently on 3 mics of Levophed, titrate with MAP goal 65 Continue broad-spectrum antibiotics  with linezolid and Zosyn Lactate continue to trend down Discontinue bicarbonate infusion, serum bicarbonate level at 33  Acute metabolic/toxic encephalopathy In the setting of alcohol abuse and acute kidney injury CT head w/ no acute abnormality but with chronic bilateral subdural hematoma UDS showed no substance abuse His blood alcohol level was 122 upon admission Continue thiamine and folate Minimize sedation with RASS goal -1  Acute respiratory insufficiency in the setting of encephalopathy Patient was found unresponsive He was not able to protect his airway he was intubated Continue lung protective ventilation VAP prevention bundle in place Did not tolerate spontaneous breathing trial due to apnea  Acute kidney injury due to ischemic ATN in the setting of shock Hypokalemia Hypocalcemia Serum creatinine continue to trend up, currently at 3.5 Patient made 1.5 L of urine in last 24-hour after Lasix challenge Monitor intake and output Avoid nephrotoxic agents Continue aggressive electrolyte supplement  Possible pancreatitis: CT abd/pelvis showing prior whipple procedure w/ sligh edema around pancreatic body and tail concerncing for acute interstitial pancreatitis Lipase is slightly elevated Discontinue IV fluid Continue tube feeds  Shock liver/alcoholic hepatitis Liver lesion: CT showing low-density lesion in lower medial aspect of segment 6 of liver Will need MRI w/ and wo contrast, liver protocol once stable Monitor LFTs Monitor coagulation panel  Thrombocytopenia critical illness Platelet count dropped to 108, closely monitor  Best Practice (right click and "Reselect all SmartList Selections" daily)   Diet/type: NPO tube feeds DVT prophylaxis: SCD GI prophylaxis: PPI Lines: N/A Foley:  Yes, and it is still needed Code Status:  full code Last date of multidisciplinary goals of care discussion [5/4: Patient's family was updated at bedside  Labs   CBC: Recent  Labs  Lab 04/17/23 0200 04/17/23 0250 04/17/23 1106 04/17/23 1123 04/18/23 0503 04/18/23 1035 04/18/23 1700  WBC 15.2*  --   --  10.4 11.7*  --   --   NEUTROABS  --   --   --   --  9.8*  --   --   HGB 9.6*   < > 7.1* 7.3* 6.6* 6.5* 9.7*  HCT 31.1*   < > 21.0* 20.8* 18.4* 19.0* 26.3*  MCV 98.1  --   --  85.2 83.6  --   --   PLT 189  --   --  153 125*  141*  --   --    < > = values in this interval not displayed.    Basic Metabolic Panel: Recent Labs  Lab 04/17/23 0107 04/17/23 0200 04/17/23 0250 04/17/23 0415 04/17/23 0527 04/17/23 1106 04/17/23 1123 04/17/23 1632 04/18/23 0501 04/18/23 0503 04/18/23 1035  NA 133*  132* 134*   < >  --    < > 131* 132* 133* 131*  --  133*  K 5.4*  5.4* 3.3*   < >  --    < > 3.9 3.8 3.3* 3.9  --  3.8  CL 108 98  --   --   --   --  89* 92* 92*  --   --   CO2  --  <7*  --   --   --   --  18* 22 21*  --   --  GLUCOSE 87 83  --   --   --   --  164* 162* 154*  --   --   BUN 30* 28*  --   --   --   --  32* 35* 43*  --   --   CREATININE 2.20* 2.03*  --   --   --   --  2.48* 2.63* 3.06*  --   --   CALCIUM  --  7.4*  --   --   --   --  6.6* 7.1* 6.5*  --   --   MG  --   --   --  2.6*  --   --   --   --   --  2.0  --   PHOS  --   --   --   --   --   --   --   --   --  4.1  --    < > = values in this interval not displayed.   GFR: Estimated Creatinine Clearance: 20.5 mL/min (A) (by C-G formula based on SCr of 3.06 mg/dL (H)). Recent Labs  Lab 04/17/23 0200 04/17/23 0249 04/17/23 0415 04/17/23 0652 04/17/23 1123 04/17/23 1328 04/18/23 0503 04/18/23 1124 04/18/23 1514  PROCALCITON  --   --  0.13  --   --   --   --   --   --   WBC 15.2*  --   --   --  10.4  --  11.7*  --   --   LATICACIDVEN  --    < > >9.0*   < > >9.0* 6.5*  --  3.1* 2.3*   < > = values in this interval not displayed.    Liver Function Tests: Recent Labs  Lab 04/17/23 0200 04/17/23 1123  AST 927* 3,014*  ALT 350* 1,017*  ALKPHOS 111 111  BILITOT 0.4 1.0   PROT 4.7* 3.9*  ALBUMIN 1.8* 1.5*   Recent Labs  Lab 04/17/23 0415  LIPASE 197*   Recent Labs  Lab 04/17/23 0415  AMMONIA 143*    ABG    Component Value Date/Time   PHART 7.622 (HH) 04/18/2023 1035   PCO2ART 25.2 (L) 04/18/2023 1035   PO2ART 138 (H) 04/18/2023 1035   HCO3 26.0 04/18/2023 1035   TCO2 27 04/18/2023 1035   ACIDBASEDEF 6.0 (H) 04/17/2023 1106   O2SAT 100 04/18/2023 1035     Coagulation Profile: Recent Labs  Lab 04/17/23 0652 04/18/23 0503  INR 1.6* 1.6*    Cardiac Enzymes: Recent Labs  Lab 04/17/23 0415  CKTOTAL 359    HbA1C: No results found for: "HGBA1C"  CBG: Recent Labs  Lab 04/18/23 1128 04/18/23 1507 04/18/23 1915 04/18/23 2328 04/19/23 0331  GLUCAP 162* 153* 137* 138* 131*    This patient is critically ill with multiple organ system failure which requires frequent high complexity decision making, assessment, support, evaluation, and titration of therapies. This was completed through the application of advanced monitoring technologies and extensive interpretation of multiple databases.  During this encounter critical care time was devoted to patient care services described in this note for 36 minutes.    Cheri Fowler, MD [facility] See Amion for pager If no response to pager, please call [phone] until 7pm After 7pm, Please call E-link [phone]

## 2023-04-19 NOTE — Progress Notes (Signed)
Trauma/Critical Care Follow Up Note  Subjective:    Overnight Issues:   Objective:  Vital signs for last 24 hours: Temp:  [97.9 F (36.6 C)-99.3 F (37.4 C)] 98.4 F (36.9 C) (05/06 0600) Pulse Rate:  [50-62] 57 (05/06 0600) Resp:  [14-22] 16 (05/06 0600) BP: (81-129)/(42-63) 104/60 (05/06 0600) SpO2:  [100 %] 100 % (05/06 0600) FiO2 (%):  [35 %-40 %] 35 % (05/06 0322)  Hemodynamic parameters for last 24 hours:    Intake/Output from previous day: 05/05 0701 - 05/06 0700 In: 5432.5 [I.V.:3026.4; Blood:630; NG/GT:1380; IV Piggyback:396.2] Out: 1555 [Urine:1555]  Intake/Output this shift: No intake/output data recorded.  Vent settings for last 24 hours: Vent Mode: PRVC FiO2 (%):  [35 %-40 %] 35 % Set Rate:  [16 bmp-18 bmp] 16 bmp Vt Set:  [540 mL] 540 mL PEEP:  [5 cmH20] 5 cmH20 Plateau Pressure:  [15 cmH20-19 cmH20] 19 cmH20  Physical Exam:  Gen: comfortable, no distress Neuro: not following commands, sedated on exam HEENT: PERRL Neck: c-collar in place CV: RRR Pulm: unlabored breathing on mechanical ventilation Abd: soft, NT    GU: urine clear and yellow  Extr: wwp, no edema  Results for orders placed or performed during the hospital encounter of 04/17/23 (from the past 24 hour(s))  Prepare RBC (crossmatch)     Status: None   Collection Time: 04/18/23 10:30 AM  Result Value Ref Range   Order Confirmation      ORDER PROCESSED BY BLOOD BANK Performed at [facility], [address], [place], [place] [address]   I-STAT 7, (LYTES, BLD GAS, ICA, H+H)     Status: Abnormal   Collection Time: 04/18/23 10:35 AM  Result Value Ref Range   pH, Arterial 7.622 (HH) 7.35 - 7.45   pCO2 arterial 25.2 (L) 32 - 48 mmHg   pO2, Arterial 138 (H) 83 - 108 mmHg   Bicarbonate 26.0 20.0 - 28.0 mmol/L   TCO2 27 22 - 32 mmol/L   O2 Saturation 100 %   Acid-Base Excess 5.0 (H) 0.0 - 2.0 mmol/L   Sodium 133 (L) 135 - 145 mmol/L   Potassium 3.8 3.5 - 5.1 mmol/L   Calcium,  Ion 0.90 (L) 1.15 - 1.40 mmol/L   HCT 19.0 (L) 39.0 - 52.0 %   Hemoglobin 6.5 (LL) 13.0 - 17.0 g/dL   Sample type ARTERIAL    Comment NOTIFIED PHYSICIAN   Lactic acid, plasma     Status: Abnormal   Collection Time: 04/18/23 11:24 AM  Result Value Ref Range   Lactic Acid, Venous 3.1 (HH) 0.5 - 1.9 mmol/L  Glucose, capillary     Status: Abnormal   Collection Time: 04/18/23 11:28 AM  Result Value Ref Range   Glucose-Capillary 162 (H) 70 - 99 mg/dL  Glucose, capillary     Status: Abnormal   Collection Time: 04/18/23  3:07 PM  Result Value Ref Range   Glucose-Capillary 153 (H) 70 - 99 mg/dL  Lactic acid, plasma     Status: Abnormal   Collection Time: 04/18/23  3:14 PM  Result Value Ref Range   Lactic Acid, Venous 2.3 (HH) 0.5 - 1.9 mmol/L  Hemoglobin and hematocrit, blood     Status: Abnormal   Collection Time: 04/18/23  5:00 PM  Result Value Ref Range   Hemoglobin 9.7 (L) 13.0 - 17.0 g/dL   HCT 60.4 (L) 54.0 - 98.1 %  Glucose, capillary     Status: Abnormal   Collection Time: 04/18/23  7:15 PM  Result Value Ref Range   Glucose-Capillary 137 (H) 70 - 99 mg/dL  Glucose, capillary     Status: Abnormal   Collection Time: 04/18/23 11:28 PM  Result Value Ref Range   Glucose-Capillary 138 (H) 70 - 99 mg/dL  Glucose, capillary     Status: Abnormal   Collection Time: 04/19/23  3:31 AM  Result Value Ref Range   Glucose-Capillary 131 (H) 70 - 99 mg/dL  CBC with Differential/Platelet     Status: Abnormal   Collection Time: 04/19/23  7:19 AM  Result Value Ref Range   WBC 10.5 4.0 - 10.5 K/uL   RBC 3.28 (L) 4.22 - 5.81 MIL/uL   Hemoglobin 9.5 (L) 13.0 - 17.0 g/dL   HCT 16.1 (L) 09.6 - 04.5 %   MCV 84.5 80.0 - 100.0 fL   MCH 29.0 26.0 - 34.0 pg   MCHC 34.3 30.0 - 36.0 g/dL   RDW 40.9 (H) 81.1 - 91.4 %   Platelets 108 (L) 150 - 400 K/uL   nRBC 0.0 0.0 - 0.2 %   Neutrophils Relative % 78 %   Neutro Abs 8.1 (H) 1.7 - 7.7 K/uL   Lymphocytes Relative 13 %   Lymphs Abs 1.4 0.7 - 4.0 K/uL    Monocytes Relative 6 %   Monocytes Absolute 0.6 0.1 - 1.0 K/uL   Eosinophils Relative 2 %   Eosinophils Absolute 0.2 0.0 - 0.5 K/uL   Basophils Relative 0 %   Basophils Absolute 0.0 0.0 - 0.1 K/uL   Immature Granulocytes 1 %   Abs Immature Granulocytes 0.09 (H) 0.00 - 0.07 K/uL    Assessment & Plan: The plan of care was discussed with the bedside nurse for the day,  Traci, who is in agreement with this plan and no additional concerns were raised.   Present on Admission:  Acute encephalopathy    LOS: 2 days   Additional comments:I reviewed the patient's new clinical lab test results.   and I reviewed the patients new imaging test results.    58M found down with based on imaging history - numerous prior falls.   Acute fxs of C7, T1 - as per nsgy Dr. Maisie Fus - Dr. Madilyn Hook is consulting L chronic SDH - as per nsgy Dr. Maisie Fus R chronic SDH -  as per nsgy Hypotension - FAST negative. CT chest/abd/pelvis shows no evidence of hemorrhage. Given 2U PRBC in trauma bay on arrival for hypotension presumed acute blood loss; hgb stable at 9.Cont vasopressor support as needed.  Acute encephalopathy - CCM managing FEN - recommend initiation of tube feeds DVT - SCDs, LMWH 30mg  BID recommended when cleared by NSGY Dispo - ICU, trauma to sign off, discussed with primary team  Diamantina Monks, MD Trauma & General Surgery Please use [email or web address] to contact on call provider  04/19/2023  *Care during the described time interval was provided by me. I have reviewed this patient's available data, including medical history, events of note, physical examination and test results as part of my evaluation.

## 2023-04-19 NOTE — Consult Note (Signed)
WOC Nurse Consult Note: Reason for Consult:Fall from ladder at home with compression fractures and subdural hematomas.  Remains intubated and sedeated. Multiple lacerations on scalp.  Skin tears/traumatic lesions to bilateral arms and knees.  In C collar and mittens on hands for protection Wound type: trauma after fall from ladder at home Pressure Injury POA: NA Measurement: Anterior scalp laceration with staples intact  Minimal serosanguinous oozing.  Nonapproximated lesion to left lateral scalp   Dorsal occipital area with dried crusted exudate and unclear if there is a lesion here or just coagulation.  Nursing to clean head wounds with hydrogen peroxide and assess further.  Skin tears to bilateral arms with bruising and weeping Bilateral knees with traumatic abrasions  Wound bed: Moist, red and weeping.   Drainage (amount, consistency, odor) minimal serosanguinous   Periwound: Bruising to arms, legs and back.   Bruising to head  ET tube in place, lips are edematous, site rotated regularly per nursing and RT.  No breakdown noted at this time.   Dressing procedure/placement/frequency:  Cleanse skin tears to arms and legs (knees) with NS and pat dry. Approximate wound edges if possible. Apply Xeroform gauze to open wounds.  Cover with silicone foam.  Change MOn/Wed/Friday.  Cleanse head wounds with hydrogen peroxide, apply mupirocin ointment.  Cover with 2x2 gauze as needed. Change each shift.  Will not follow at this time.  Please re-consult if needed.  Mike Gip MSN, RN, FNP-BC CWON Wound, Ostomy, Continence Nurse Outpatient [facility] [phone] Pager [phone]

## 2023-04-19 NOTE — Progress Notes (Signed)
Nutrition Follow-up  DOCUMENTATION CODES:   Non-severe (moderate) malnutrition in context of social or environmental circumstances  INTERVENTION:   Tube feeding via OG tube: Pivot 1.5 at 60 ml/h (1440 ml per day)  Provides 2160 kcal, 135 gm protein, 1080 ml free water daily  Continue thiamine and folic acid   Monitor magnesium and phosphorus x 4 occurrences, MD to replete as needed, as pt is at risk for refeeding syndrome given pt meets criteria for malnutrition.  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 Ongoing.   GOAL:   Patient will meet greater than or equal to 90% of their needs Met with TF at goal   MONITOR:   TF tolerance  REASON FOR ASSESSMENT:   Consult Assessment of nutrition requirement/status, Enteral/tube feeding initiation and management  ASSESSMENT:   Pt with ETOH abuse admitted post fall found down in backyard with multiple head lacerations and abrasions to BLE and BUE, C7 and T1 fxs. Found to have chronic SDHs and per CT pt with previous whipple, possible acute interstitial pancreatitis, possible enteritis, and possible ileus vs SBO.  Pt discussed during ICU rounds and with RN and MD.   Pt remains on vent support. Low dose pressor with MAP >67. Pt did not tolerate SBT.  No family present during visit.   CT chest/abd/pelvis w/ prior whipple procedure w/ slight edema around pancreatic body and tail concerncing for acute interstitial pancreatitis; possible enteritis; and possible ileus vs. Low-grade obstruction of small bowel   05/04 - found down in backyard, pt with multiple head lacerations and abrasions to BLE and BUE  Medications reviewed and include: folic acid, SSI, MVI with minerals, protonix, thiamine  Levophed @ 1 mcg  10 mEq KCl x 4  Propofol @ 8 ml/hr provides: 211 kcal   Labs reviewed: K 3.4 CBG's:  131-154   18 F OG tube; per xray in gastric fundus   NUTRITION - FOCUSED PHYSICAL EXAM:  Flowsheet Row Most Recent Value  Orbital Region Severe depletion  Upper Arm Region Moderate depletion  Thoracic and Lumbar Region Moderate depletion  Buccal Region Unable to assess  Temple Region Moderate depletion  Clavicle Bone Region Moderate depletion  Clavicle and Acromion Bone Region Moderate depletion  Scapular Bone Region Unable to assess  Dorsal Hand Moderate depletion  Patellar Region Moderate depletion  Anterior Thigh Region Moderate depletion  Posterior Calf Region Moderate depletion  Edema (RD Assessment) None  Hair Reviewed  Eyes Reviewed  Mouth Unable to assess  Skin Reviewed  Nails Reviewed       Diet Order:   Diet Order             Diet NPO time specified  Diet effective now                   EDUCATION NEEDS:   No education needs have been identified at this time  Skin:  Skin Assessment: Skin Integrity Issues: Skin Integrity Issues:: Incisions Incisions: closed posterior upper head  Last BM:  5/5 mult large BMs, FMS placed  Height:   Ht Readings from Last 1 Encounters:  04/17/23 6' (1.829 m)    Weight:   Wt Readings from Last 1 Encounters:  04/18/23 69.6 kg    Ideal Body Weight:  80.9 kg  BMI:  Body mass index is 20.81 kg/m.  Estimated Nutritional Needs:   Kcal:  2000-2200  Protein:  105-135  grams  Fluid:  >2 L/day  Cammy Copa., RD, LDN, CNSC See AMiON for contact information

## 2023-04-20 DIAGNOSIS — E440 Moderate protein-calorie malnutrition: Secondary | ICD-10-CM | POA: Insufficient documentation

## 2023-04-20 LAB — COMPREHENSIVE METABOLIC PANEL
ALT: 46 U/L — ABNORMAL HIGH (ref 0–44)
AST: 418 U/L — ABNORMAL HIGH (ref 15–41)
Albumin: <1.5 g/dL — ABNORMAL LOW (ref 3.5–5.0)
Alkaline Phosphatase: 100 U/L (ref 38–126)
Anion gap: 14 (ref 5–15)
BUN: 68 mg/dL — ABNORMAL HIGH (ref 8–23)
CO2: 29 mmol/L (ref 22–32)
Calcium: 7.2 mg/dL — ABNORMAL LOW (ref 8.9–10.3)
Chloride: 91 mmol/L — ABNORMAL LOW (ref 98–111)
Creatinine, Ser: 4.11 mg/dL — ABNORMAL HIGH (ref 0.61–1.24)
GFR, Estimated: 14 mL/min — ABNORMAL LOW (ref >60)
Glucose, Bld: 118 mg/dL — ABNORMAL HIGH (ref 70–99)
Potassium: 3.5 mmol/L (ref 3.5–5.1)
Sodium: 134 mmol/L — ABNORMAL LOW (ref 135–145)
Total Bilirubin: 0.8 mg/dL (ref 0.3–1.2)
Total Protein: 4.8 g/dL — ABNORMAL LOW (ref 6.5–8.1)

## 2023-04-20 LAB — GLUCOSE, CAPILLARY
Glucose-Capillary: 154 mg/dL — ABNORMAL HIGH (ref 70–99)
Glucose-Capillary: 126 mg/dL — ABNORMAL HIGH (ref 70–99)
Glucose-Capillary: 121 mg/dL — ABNORMAL HIGH (ref 70–99)
Glucose-Capillary: 99 mg/dL (ref 70–99)
Glucose-Capillary: 96 mg/dL (ref 70–99)
Glucose-Capillary: 85 mg/dL (ref 70–99)

## 2023-04-20 LAB — PHOSPHORUS: Phosphorus: 2 mg/dL — ABNORMAL LOW (ref 2.5–4.6)

## 2023-04-20 LAB — CULTURE, BLOOD (ROUTINE X 2)

## 2023-04-20 LAB — TRIGLYCERIDES: Triglycerides: 108 mg/dL (ref <150)

## 2023-04-20 LAB — MAGNESIUM: Magnesium: 2.4 mg/dL (ref 1.7–2.4)

## 2023-04-20 MED ORDER — POTASSIUM PHOSPHATES 15 MMOLE/5ML IV SOLN
15.0000 mmol | Freq: Once | INTRAVENOUS | Status: AC
  Administered 2023-04-20: 15 mmol via INTRAVENOUS
  Filled 2023-04-20: qty 5

## 2023-04-20 MED ORDER — ENOXAPARIN SODIUM 30 MG/0.3ML IJ SOSY
30.0000 mg | PREFILLED_SYRINGE | INTRAMUSCULAR | Status: DC
  Administered 2023-04-20 – 2023-04-29 (×10): 30 mg via SUBCUTANEOUS
  Filled 2023-04-20 (×10): qty 0.3

## 2023-04-20 MED ORDER — ORAL CARE MOUTH RINSE: 15.0000 mL | OROMUCOSAL | Status: DC | PRN

## 2023-04-20 MED ORDER — CALCIUM GLUCONATE-NACL 1-0.675 GM/50ML-% IV SOLN
1.0000 g | Freq: Once | INTRAVENOUS | Status: AC
  Administered 2023-04-20: 1000 mg via INTRAVENOUS
  Filled 2023-04-20: qty 50

## 2023-04-20 MED ORDER — ORAL CARE MOUTH RINSE
15.0000 mL | OROMUCOSAL | Status: DC
  Administered 2023-04-20 – 2023-04-29 (×34): 15 mL via OROMUCOSAL
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
## 2023-04-20 NOTE — Procedures (Signed)
Extubation Procedure Note  Patient Details:   Name: Chad Winters DOB: [birth year] MRN: [id number]   Airway Documentation:    Vent end date: 04/20/23 Vent end time: 0918   Evaluation  O2 sats: stable throughout and currently acceptable Complications: No apparent complications Patient did tolerate procedure well. Bilateral Breath Sounds: Clear, Diminished   Yes  Pt extubated to 3L [place] per MD Order. Positive cuff leak noted prior to extubation. Pt has a weak but productive cough and encouraged to use [place] to clear secretions. No stridor hears. VS WNL. Pt able to speak without complications.  Carolan Shiver 04/20/2023, 9:19 AM

## 2023-04-21 ENCOUNTER — Inpatient Hospital Stay (HOSPITAL_COMMUNITY): Payer: Medicare HMO

## 2023-04-21 LAB — URINALYSIS, ROUTINE W REFLEX MICROSCOPIC
Bilirubin Urine: NEGATIVE
Glucose, UA: NEGATIVE mg/dL
Ketones, ur: NEGATIVE mg/dL
Leukocytes,Ua: NEGATIVE
Nitrite: NEGATIVE
Protein, ur: 100 mg/dL — AB
Specific Gravity, Urine: 1.013 (ref 1.005–1.030)
pH: 8 (ref 5.0–8.0)

## 2023-04-21 LAB — HEMOGLOBIN A1C
Hgb A1c MFr Bld: 5.4 % (ref 4.8–5.6)
Mean Plasma Glucose: 108.28 mg/dL

## 2023-04-21 LAB — GLUCOSE, CAPILLARY
Glucose-Capillary: 98 mg/dL (ref 70–99)
Glucose-Capillary: 92 mg/dL (ref 70–99)
Glucose-Capillary: 94 mg/dL (ref 70–99)
Glucose-Capillary: 100 mg/dL — ABNORMAL HIGH (ref 70–99)
Glucose-Capillary: 92 mg/dL (ref 70–99)
Glucose-Capillary: 131 mg/dL — ABNORMAL HIGH (ref 70–99)

## 2023-04-21 LAB — BASIC METABOLIC PANEL
Anion gap: 15 (ref 5–15)
BUN: 65 mg/dL — ABNORMAL HIGH (ref 8–23)
CO2: 29 mmol/L (ref 22–32)
Calcium: 7.8 mg/dL — ABNORMAL LOW (ref 8.9–10.3)
Chloride: 93 mmol/L — ABNORMAL LOW (ref 98–111)
Creatinine, Ser: 4.5 mg/dL — ABNORMAL HIGH (ref 0.61–1.24)
GFR, Estimated: 13 mL/min — ABNORMAL LOW (ref >60)
Glucose, Bld: 92 mg/dL (ref 70–99)
Potassium: 3.9 mmol/L (ref 3.5–5.1)
Sodium: 137 mmol/L (ref 135–145)

## 2023-04-21 LAB — PROTEIN / CREATININE RATIO, URINE
Creatinine, Urine: 32 mg/dL
Protein Creatinine Ratio: 3.34 mg/mg{Cre} — ABNORMAL HIGH (ref 0.00–0.15)
Total Protein, Urine: 107 mg/dL

## 2023-04-21 LAB — MAGNESIUM: Magnesium: 2.4 mg/dL (ref 1.7–2.4)

## 2023-04-21 LAB — TRIGLYCERIDES: Triglycerides: 131 mg/dL (ref <150)

## 2023-04-21 LAB — PHOSPHORUS: Phosphorus: 5.5 mg/dL — ABNORMAL HIGH (ref 2.5–4.6)

## 2023-04-21 MED ORDER — SODIUM CHLORIDE 0.9 % IV SOLN
2.0000 g | INTRAVENOUS | Status: AC
  Administered 2023-04-21 – 2023-04-23 (×3): 2 g via INTRAVENOUS
  Filled 2023-04-21 (×3): qty 20

## 2023-04-21 MED ORDER — LACTATED RINGERS IV SOLN: INTRAVENOUS | Status: AC

## 2023-04-21 MED ORDER — ALBUMIN HUMAN 25 % IV SOLN
25.0000 g | Freq: Four times a day (QID) | INTRAVENOUS | Status: AC
  Administered 2023-04-21 (×2): 25 g via INTRAVENOUS
  Filled 2023-04-21 (×2): qty 100

## 2023-04-21 MED ORDER — FREE WATER
250.0000 mL | Freq: Four times a day (QID) | Status: DC
  Administered 2023-04-22 – 2023-04-26 (×13): 250 mL

## 2023-04-21 MED ORDER — HYDROXYZINE HCL 25 MG PO TABS: 25.0000 mg | ORAL_TABLET | Freq: Once | ORAL | Status: DC

## 2023-04-21 MED ORDER — BANATROL TF EN LIQD
60.0000 mL | Freq: Two times a day (BID) | ENTERAL | Status: DC
  Administered 2023-04-21 – 2023-04-26 (×10): 60 mL
  Filled 2023-04-21 (×10): qty 60

## 2023-04-21 MED ORDER — MIDAZOLAM HCL 2 MG/2ML IJ SOLN: 1.0000 mg | INTRAMUSCULAR | Status: DC | PRN

## 2023-04-21 NOTE — Progress Notes (Signed)
eLink Physician-Brief Progress Note Patient Name: Chad Winters DOB: 22-Apr-1946 MRN: [id number]   Date of Service  04/21/2023  HPI/Events of Note  77 year old male found down with multiple lacerations on his scalp found to have acute pancreatitis, bleeding, intubated and now extubated.  He has had intermittent agitation.  Currently, he is on 2 L of oxygen by nasal pressures, requiring immediate ICU needs.  Acute kidney injury still worsening but renal replacement therapy.  eICU Interventions  Ultimately, the patient can be transferred to progressive care.  Will need to consult TRH for assumption of care.        Chad Winters 04/21/2023, 9:56 PM

## 2023-04-21 NOTE — Consult Note (Addendum)
Patient with fluctuating creatinine but baseline of peers to be somewhere between 1.3-1.7.  Did have a creatinine of 1.3 last month in the outpatient setting.  History of partial nephrectomy.  Also has proteinuria and causes not definitively clear.  Rising creatinine since here.  Most likely related to tubular injury from acute illness and some low blood pressures.  Urine output has seemed to improve although also received diuretics.  We will provide some IV fluids today.  Also stopping Zosyn given potential culprit for AIN.  Will obtain urinalysis and urine protein to creatinine ratio.  FH of CAD and HTN in mother  Outside records were reviewed in the care of this patient. Labs and imaging were independently interpreted to aid in clinical decision making. Findings and plan were conveyed to the primary team.   I have personally seen and examined this patient and agree with the assessment/plan as outlined below by Chad Winters.  I obtained all information provided personally and performed exam.  I made corrections to documentation as needed.Chad Amass Earnestene Angello,MD 04/21/2023 1:19 PM   Nephrology Consult   Requesting provider: Dr. Merrily Pew Service requesting consult: Critical Care Reason for consult: AKI   Assessment/Recommendations: Chad Winters is a/an 77 y.o. male with a past medical history of ETOH use, Renal CA, and HTN who presents w/ Acute encephalopathy  Non-Oliguric AKI (unstable): Likely multifactorial components from septic shock, blood loss, general low volume, acute interstitial pancreatitis, and made worse by low albumin and contrast. Overall, seems to be a pre-renal cause, possible component of ATN. Once fluid status is stabilized, I believe that so will his renal function. -Chart reviewed: (medications acceptable, RFs as above).   -Continue to monitor daily Cr, Dose meds for GFR -Monitor Daily I/Os, Daily weight  -Maintain MAP>65 for optimal renal perfusion.  -Avoid  nephrotoxic medications including NSAIDs and contrast -Use synthetic opioids (Fentanyl/Dilaudid) if needed -Currently no indication for HD - Baseline Cr probably ~1.5, current nadir of 2.03 and recent peak of 4.50 - Phos 2.2>2.0>5.5- CTM - Continue lactated ringers, added 2 doses of 25% albumin in light of recent alb value <1.5 - Order UA w/ microscopy to assess for casts and further hydration status - Order urine protein/cr ratio - Due to concern for possible AIN, Zosyn has been switched to Ceftriaxone, appreciate aid of primary team  Septic shock- BP resolved, no longer on pressors. Aim for MAP>65 as above.  Hx of Renal CA- pt was not able to produce many details, had a partial resection and no chemo.  Metabolic acidosis 2/2 lactic acidosis- resolved. CO2 29. Agree with further bicarb if it recurs.  Hypokalemia- Resolved. Per hx, appears to be a chronic issue. CTM.  Hypocalcemia- Currently uptrending though hypoalbuminemia continues to make it appear lower than it really is, last ionized calcium 0.88. Will CTM.  Volume Status: Appears hypovolemic on exam. Based on our examination and review of available imaging, our recommendation is as above.   Recommendations conveyed to primary service.    Chad Winters [facility] 04/21/2023 10:10 AM   _____________________________________________________________________________________ CC: Acute encephalopathy  History of Present Illness: Chad Winters is a/an 77 y.o. male with a past medical history of alcohol use and Renal CA who presented to the ED on 5/4 arriving by EMS. Pt was found unconscious on the ground outside in his backyard via a bystander. Pt presented as hypotensive, bradycardic, and hypothermic with multiple lacerations to UE, LE, and a scalp laceration. ETOH level 122. Pt ended up  requiring IV fluids and two units of blood for blood loss. Pt was intubated and had an extensive trauma workup including a CT  CAP w/ contrast, the rest of imaging w/o contrast. Due to hypotension pt was being stabilized on pressors (levofed). Pt was having low UOP but with the help of a lasix trial, which he is now off of. Since then pt has had good UOP., been extubated, and is off of pressors. 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  Pt is pleasant and A&O x 3. Pt reported that he had stage 3 kidney cancer which was resected but records are limited, denied chemo, will continue to follow. CT CAP reported- Interval development of diffuse patchy hypoenhancement of both kidneys which could be due to multifocal infarctions, contrast nephrotoxicity or an infectious process. Pt reports that he feels extremely dry. Pt seems to be well oriented during examination. Pt has no other questions at this time.  Medications:  Current Facility-Administered Medications  Medication Dose Route Frequency Provider Last Rate Last Admin   0.9 %  sodium chloride infusion  250 mL Intravenous Continuous Pia Mau D, PA-C       cefTRIAXone (ROCEPHIN) 2 g in sodium chloride 0.9 % 100 mL IVPB  2 g Intravenous Q24H Cheri Fowler, MD 200 mL/hr at 04/21/23 0956 2 g at 04/21/23 0956   Chlorhexidine Gluconate Cloth 2 % PADS 6 each  6 each Topical Daily Paliwal, Aditya, MD   6 each at 04/20/23 1000   docusate (COLACE) 50 MG/5ML liquid 100 mg  100 mg Per Tube BID PRN Pia Mau D, PA-C       enoxaparin (LOVENOX) injection 30 mg  30 mg Subcutaneous Q24H Chand, Garnet Sierras, MD   30 mg at 04/20/23 1102   feeding supplement (PIVOT 1.5 CAL) liquid 1,000 mL  1,000 mL Per Tube Continuous Cheri Fowler, MD   Stopped at 04/21/23 0659   fentaNYL (SUBLIMAZE) bolus via infusion 25-100 mcg  25-100 mcg Intravenous Q15 min PRN Pia Mau D, PA-C   100 mcg at 04/17/23 2256   folic acid injection 1 mg  1 mg Intravenous Daily Lidia Collum, PA-C   1 mg at  04/20/23 1130   hydrogen peroxide 3 % external solution   Topical BID Bedelia Person, MD   Given at 04/20/23 2129   insulin aspart (novoLOG) injection 0-9 Units  0-9 Units Subcutaneous Q4H Cheri Fowler, MD   1 Units at 04/20/23 0325   midazolam (VERSED) injection 1 mg  1 mg Intravenous Q2H PRN Paliwal, Eliezer Lofts, MD       multivitamin with minerals tablet 1 tablet  1 tablet Per Tube Daily Pia Mau D, PA-C   1 tablet at 04/19/23 1015   mupirocin ointment (BACTROBAN) 2 %   Topical BID Bedelia Person, MD   Given at 04/20/23 2129   Oral care mouth rinse  15 mL Mouth Rinse 4 times per day Cheri Fowler, MD   15 mL at 04/21/23 0810   Oral care mouth rinse  15 mL Mouth Rinse PRN Cheri Fowler, MD       pantoprazole (PROTONIX) injection 40 mg  40 mg Intravenous QHS Pia Mau D, PA-C   40 mg at 04/20/23 2128   polyethylene glycol (MIRALAX / GLYCOLAX) packet 17 g  17 g Per Tube Daily PRN Lidia Collum, PA-C  thiamine (VITAMIN B1) injection 100 mg  100 mg Intravenous Daily Lidia Collum, PA-C   100 mg at 04/20/23 1054     ALLERGIES Ciprofloxacin, Codeine, and Lisinopril  MEDICAL HISTORY History reviewed. No pertinent past medical history.   SOCIAL HISTORY Social History   Socioeconomic History   Marital status: Unknown    Spouse name: Not on file   Number of children: Not on file   Years of education: Not on file   Highest education level: Not on file  Occupational History   Not on file  Tobacco Use   Smoking status: Unknown   Smokeless tobacco: Not on file  Substance and Sexual Activity   Alcohol use: Not on file   Drug use: Not on file   Sexual activity: Not on file  Other Topics Concern   Not on file  Social History Narrative   Not on file   Social Determinants of Health   Financial Resource Strain: Not on file  Food Insecurity: Not on file  Transportation Needs: Not on file  Physical Activity: Not on file  Stress: Not on file  Social Connections: Not on file   Intimate Partner Violence: Not on file     FAMILY HISTORY History reviewed. No pertinent family history.    Review of Systems: ROS grossly neg except per HPI  Physical Exam: Vitals:   04/21/23 0800 04/21/23 0900  BP: 123/72 (!) 115/56  Pulse: 85 75  Resp: (!) 26 18  Temp: 98.1 F (36.7 C)   SpO2: 97% 99%   Total I/O In: -  Out: 250 [Urine:250]  Intake/Output Summary (Last 24 hours) at 04/21/2023 1010 Last data filed at 04/21/2023 0900 Gross per 24 hour  Intake 1005.34 ml  Output 1750 ml  Net -744.66 ml   General: well-appearing, no acute distress HEENT: anicteric sclera, lips chapped and dry appearing CV: regular rate, normal rhythm, no murmurs, no gallops, no rubs, no peripheral edema. Cannot assess JVD due to C collar. Lungs: clear to auscultation bilaterally, normal work of breathing Abd: soft, non-tender, non-distended Skin: no visible lesions or rashes Psych: A&O x 3, engaged, appropriate mood and affect Musculoskeletal: various skin tears bandaged on UE and LE plus a stapled scalp laceration Neuro: slightly mumbled speech but no focal gross deficits noted  Test Results Reviewed Lab Results  Component Value Date   NA 137 04/21/2023   K 3.9 04/21/2023   CL 93 (L) 04/21/2023   CO2 29 04/21/2023   BUN 65 (H) 04/21/2023   CREATININE 4.50 (H) 04/21/2023   CALCIUM 7.8 (L) 04/21/2023   ALBUMIN <1.5 (L) 04/20/2023   PHOS 5.5 (H) 04/21/2023    CBC Recent Labs  Lab 04/17/23 1123 04/18/23 0503 04/18/23 1035 04/18/23 1700 04/19/23 0719 04/19/23 0812  WBC 10.4 11.7*  --   --  10.5  --   NEUTROABS  --  9.8*  --   --  8.1*  --   HGB 7.3* 6.6*   < > 9.7* 9.5* 9.2*  HCT 20.8* 18.4*   < > 26.3* 27.7* 27.0*  MCV 85.2 83.6  --   --  84.5  --   PLT 153 125*  141*  --   --  108*  --    < > = values in this interval not displayed.    I have reviewed all relevant outside healthcare records related to the patient's current hospitalization

## 2023-04-21 NOTE — Evaluation (Signed)
Occupational Therapy Evaluation Patient Details Name: Chad Winters MRN: [id number] DOB: May 26, 1946 Today's Date: 04/21/2023   History of Present Illness Patient presenting to ED on 04/17/23 after being found down in his backyard. Patient hypothermic, bradycardic, and intubated. Multiple lesions on scalp, BUEs, and BLEs. CT head/c-spine showing acute fractures of c7 and t, neurosurgery recommending C collar. CT chest/abd/pelvis w/ prior whipple procedure w/ slight edema around pancreatic body and tail concerncing for acute interstitial pancreatitis; possible enteritis; and possible ileus vs. Low-grade obstruction of small bowel; low-density lesion in lower medial aspect of segment 6 of liver. MRI is pending. No PMH on file.   Clinical Impression   Prior to this admission, patient was living alone and assumed independent. Patient with waxing and waning cognition throughout evaluation, and also hard of hearing. Currently, Patient presenting with altered mental status, generalized weakness, pain with movement, and need for increased assist to complete all ADLs. Patient with lacerations all over his body, and when questioned how many times he has fallen, he stated, "how much is a lot?" Patient also with scrotal swelling, however since patient is not yet ambulatory, OT will hold on fabricating a scrotal sling (scrotum elevated at end of session on towel). Patient mod of 2 for bed mobility and transfers, and unable to maintain attention to commands to safely place in recliner, as well as also in increased pain and unstready when on feet requiring external assist. OT recommending > 3 hours of intensive rehab to return to prior level of function. OT will continue to follow.      Recommendations for follow up therapy are one component of a multi-disciplinary discharge planning process, led by the attending physician.  Recommendations may be updated based on patient status, additional functional criteria and  insurance authorization.   Assistance Recommended at Discharge Frequent or constant Supervision/Assistance (initially)  Patient can return home with the following Two people to help with walking and/or transfers;A lot of help with bathing/dressing/bathroom;Assistance with cooking/housework;Direct supervision/assist for medications management;Assist for transportation;Help with stairs or ramp for entrance;Direct supervision/assist for financial management    Functional Status Assessment  Patient has had a recent decline in their functional status and demonstrates the ability to make significant improvements in function in a reasonable and predictable amount of time.  Equipment Recommendations  Other (comment) (will continue to assess)    Recommendations for Other Services Rehab consult     Precautions / Restrictions Precautions Precautions: Fall;Cervical;Other (comment) Precaution Booklet Issued: No Precaution Comments: cervical precautions reviewed Required Braces or Orthoses: Cervical Brace Cervical Brace: Hard collar;At all times Restrictions Weight Bearing Restrictions: No      Mobility Bed Mobility Overal bed mobility: Needs Assistance Bed Mobility: Rolling, Sidelying to Sit, Sit to Sidelying Rolling: Mod assist, +2 for safety/equipment, +2 for physical assistance Sidelying to sit: Mod assist, +2 for safety/equipment, +2 for physical assistance     Sit to sidelying: Mod assist, +2 for physical assistance, +2 for safety/equipment General bed mobility comments: Mod A of 2 for all movement, max cues for cervical precautions and to sequence    Transfers Overall transfer level: Needs assistance Equipment used: 2 person hand held assist Transfers: Sit to/from Stand Sit to Stand: Mod assist, +2 physical assistance, +2 safety/equipment           General transfer comment: Mod A of 2 (patient pulling up on PT and OT) patient with good initiation, however unsteady on feet, but  able to take steps to [facility] with support  Balance Overall balance assessment: Needs assistance Sitting-balance support: Bilateral upper extremity supported, Feet supported Sitting balance-Leahy Scale: Fair     Standing balance support: During functional activity, Reliant on assistive device for balance, Bilateral upper extremity supported Standing balance-Leahy Scale: Poor Standing balance comment: reliant on external assist                           ADL either performed or assessed with clinical judgement   ADL Overall ADL's : Needs assistance/impaired Eating/Feeding: NPO   Grooming: Set up;Sitting   Upper Body Bathing: Moderate assistance;Sitting   Lower Body Bathing: Maximal assistance;Sitting/lateral leans;Sit to/from stand   Upper Body Dressing : Moderate assistance;Sitting   Lower Body Dressing: Maximal assistance;Sitting/lateral leans;Sit to/from stand;Total assistance   Toilet Transfer: Moderate assistance;+2 for physical assistance;+2 for safety/equipment;Cueing for safety;Cueing for sequencing Toilet Transfer Details (indicate cue type and reason): able to stand EOB with mod A of 2 (more due to shakiness/unstedy on feet) and take some small steps to [facility] Toileting- Clothing Manipulation and Hygiene: Total assistance Toileting - Clothing Manipulation Details (indicate cue type and reason): rectal tube     Functional mobility during ADLs: Moderate assistance;+2 for physical assistance;+2 for safety/equipment;Cueing for safety;Cueing for sequencing General ADL Comments: Patient presenting with altered mental status, generalized weakness, pain with movement, and need for increased assist to complete all ADLs.     Vision Baseline Vision/History: 1 Wears glasses Ability to See in Adequate Light: 0 Adequate Patient Visual Report: No change from baseline Vision Assessment?: Yes Eye Alignment: Within Functional Limits Ocular Range of Motion: Within Functional  Limits Alignment/Gaze Preference: Within Defined Limits Tracking/Visual Pursuits: Able to track stimulus in all quads without difficulty Saccades: Decreased speed of saccadic movement Additional Comments: Will continue to assess vision, with current c-collar placement, patient's head is tilted to the L. Due to cognition, patient also takes increased time to follow cues for visual tracking     Perception Perception Perception Tested?: No   Praxis      Pertinent Vitals/Pain Pain Assessment Pain Assessment: Faces Faces Pain Scale: Hurts little more Pain Location: generalized with movement Pain Descriptors / Indicators: Discomfort, Grimacing, Guarding Pain Intervention(s): Limited activity within patient's tolerance, Monitored during session, Repositioned     Hand Dominance Right   Extremity/Trunk Assessment Upper Extremity Assessment Upper Extremity Assessment: Generalized weakness   Lower Extremity Assessment Lower Extremity Assessment: Defer to PT evaluation   Cervical / Trunk Assessment Cervical / Trunk Assessment: Other exceptions (fractures, in C collar)   Communication Communication Communication: HOH   Cognition Arousal/Alertness: Lethargic Behavior During Therapy: Flat affect Overall Cognitive Status: Impaired/Different from baseline Area of Impairment: Attention, Memory, Following commands, Safety/judgement, Orientation, Awareness, Problem solving                 Orientation Level: Time Current Attention Level: Sustained Memory: Decreased recall of precautions, Decreased short-term memory Following Commands: Follows one step commands with increased time Safety/Judgement: Decreased awareness of safety, Decreased awareness of deficits Awareness: Emergent Problem Solving: Decreased initiation, Requires verbal cues, Difficulty sequencing, Requires tactile cues, Slow processing General Comments: Patient lethargic, but more alert with increased time, also hard of  hearing, patient was perseverative about his gown being wet, and increased time to follow all commands     General Comments  VSS on 1L O2    Exercises     Shoulder Instructions      Home Living Family/patient expects to be discharged to:: Private residence  Living Arrangements: Alone Available Help at Discharge: Available PRN/intermittently Type of Home: House Home Access: Stairs to enter [employer] of Steps: 1-2 depending on entrance   Home Layout: One level     Bathroom Shower/Tub: Chief Strategy Officer: Standard     Home Equipment: Agricultural consultant (2 wheels);Cane - single point;Grab bars - tub/shower   Additional Comments: Review information with patient when patient is more alert, also is hard of hearing      Prior Functioning/Environment Prior Level of Function : Independent/Modified Independent             Mobility Comments: walked occasionally with a cane ADLs Comments: independent        OT Problem List: Decreased strength;Decreased range of motion;Decreased activity tolerance;Impaired balance (sitting and/or standing);Decreased coordination;Decreased cognition;Decreased safety awareness;Decreased knowledge of use of DME or AE;Decreased knowledge of precautions;Pain      OT Treatment/Interventions: Self-care/ADL training;Therapeutic exercise;Neuromuscular education;Energy conservation;DME and/or AE instruction;Manual therapy;Therapeutic activities;Cognitive remediation/compensation;Patient/family education;Balance training;Visual/perceptual remediation/compensation    OT Goals(Current goals can be found in the care plan section) Acute Rehab OT Goals Patient Stated Goal: to get better OT Goal Formulation: With patient Time For Goal Achievement: 05/05/23 Potential to Achieve Goals: Good  OT Frequency: Min 2X/week    Co-evaluation PT/OT/SLP Co-Evaluation/Treatment: Yes Reason for Co-Treatment: Complexity of the patient's  impairments (multi-system involvement);Necessary to address cognition/behavior during functional activity;To address functional/ADL transfers;For patient/therapist safety   OT goals addressed during session: ADL's and self-care;Strengthening/ROM      AM-PAC OT "6 Clicks" Daily Activity     Outcome Measure Help from another person eating meals?: Total (NPO) Help from another person taking care of personal grooming?: A Little Help from another person toileting, which includes using toliet, bedpan, or urinal?: A Lot Help from another person bathing (including washing, rinsing, drying)?: A Lot Help from another person to put on and taking off regular upper body clothing?: A Lot Help from another person to put on and taking off regular lower body clothing?: A Lot 6 Click Score: 12   End of Session Equipment Utilized During Treatment: Cervical collar Nurse Communication: Mobility status  Activity Tolerance: Patient tolerated treatment well Patient left: in bed;with call bell/phone within reach;with bed alarm set  OT Visit Diagnosis: Unsteadiness on feet (R26.81);Other abnormalities of gait and mobility (R26.89);Repeated falls (R29.6);Muscle weakness (generalized) (M62.81);History of falling (Z91.81);Other symptoms and signs involving cognitive function;Pain Pain - Right/Left:  (Generalized)                Time: 7253-6644 OT Time Calculation (min): 28 min Charges:  OT General Charges $OT Visit: 1 Visit OT Evaluation $OT Eval Moderate Complexity: 1 Mod  Pollyann Glen E. Stone Spirito, OTR/L Acute Rehabilitation Services [phone]   Cherlyn Cushing 04/21/2023, 10:57 AM

## 2023-04-21 NOTE — Progress Notes (Addendum)
eLink Physician-Brief Progress Note Patient Name: Chad Winters DOB: 05/29/1946 MRN: [id number]   Date of Service  04/21/2023  HPI/Events of Note  77 year old male found down with multiple lacerations on his scalp found to have likely pancreatitis, bleeding, and now extubated on 5/7.  Foley was supposed to be removed, but the patient has significant scrotal swelling.  eICU Interventions  Place scrotum in scrotal sling and maintain Foley for now given skin breakdown around the area.   0250 Agitated and restless, QtC 640. Hydroxyzine x1   Intervention Category Minor Interventions: Routine modifications to care plan (e.g. PRN medications for pain, fever)  Danaly Bari 04/21/2023, 1:50 AM

## 2023-04-21 NOTE — Evaluation (Signed)
Physical Therapy Evaluation Patient Details Name: Chad Winters MRN: [id number] DOB: [birth year] Today's Date: 04/21/2023  History of Present Illness  Patient presenting to ED on 04/17/23 after being found down in his backyard. Patient hypothermic, bradycardic, and intubated. Multiple lesions on scalp, BUEs, and BLEs. CT head/c-spine showing acute compression fractures of c7 and T1, neurosurgery recommending C collar. CT chest/abd/pelvis w/ prior whipple procedure w/ slight edema around pancreatic body and tail concerncing for acute interstitial pancreatitis; possible enteritis; and possible ileus vs. Low-grade obstruction of small bowel; low-density lesion in liver. MRI is pending. No PMH on file.  Clinical Impression   Pt admitted secondary to problem above with deficits below. PTA patient was living alone and independent. He endorses one other fall, but unsure of timeframe but >3 months ago per pt.  Pt currently requires +2 moderate assist for bed mobility, transfers, and small steps along EOB. Unsure of pt's support post-discharge.  Anticipate patient will benefit from PT to address problems listed below. Will continue to follow acutely to maximize functional mobility independence and safety.          Recommendations for follow up therapy are one component of a multi-disciplinary discharge planning process, led by the attending physician.  Recommendations may be updated based on patient status, additional functional criteria and insurance authorization.  Follow Up Recommendations       Assistance Recommended at Discharge Frequent or constant Supervision/Assistance (due to decr cognition, mobility)  Patient can return home with the following  Two people to help with walking and/or transfers;A lot of help with bathing/dressing/bathroom;Assistance with cooking/housework;Direct supervision/assist for medications management;Direct supervision/assist for financial management;Assist for  transportation;Help with stairs or ramp for entrance    Equipment Recommendations Rolling walker (2 wheels)  Recommendations for Other Services  Rehab consult    Functional Status Assessment Patient has had a recent decline in their functional status and demonstrates the ability to make significant improvements in function in a reasonable and predictable amount of time.     Precautions / Restrictions Precautions Precautions: Fall;Cervical;Other (comment) Precaution Booklet Issued: No Precaution Comments: cervical precautions reviewed Required Braces or Orthoses: Cervical Brace Cervical Brace: Hard collar;At all times Restrictions Weight Bearing Restrictions: No      Mobility  Bed Mobility Overal bed mobility: Needs Assistance Bed Mobility: Rolling, Sidelying to Sit, Sit to Sidelying Rolling: Mod assist, +2 for safety/equipment, +2 for physical assistance Sidelying to sit: Mod assist, +2 for safety/equipment, +2 for physical assistance     Sit to sidelying: Mod assist, +2 for physical assistance, +2 for safety/equipment General bed mobility comments: Mod A of 2 for all movement, max cues for cervical precautions and to sequence    Transfers Overall transfer level: Needs assistance Equipment used: 2 person hand held assist Transfers: Sit to/from Stand Sit to Stand: Mod assist, +2 physical assistance, +2 safety/equipment           General transfer comment: Mod A of 2 (patient pulling up on PT and OT) patient with good initiation, however unsteady on feet, but able to take small, shaky steps to [facility] with support    Ambulation/Gait               General Gait Details: pt unwilling to attempt "I know I can't"; small, shaky steps toward HOB +2 mod assist  Stairs            Wheelchair Mobility    Modified Rankin (Stroke Patients Only)       Balance Overall balance assessment:  Needs assistance Sitting-balance support: Bilateral upper extremity supported,  Feet supported Sitting balance-Leahy Scale: Fair     Standing balance support: During functional activity, Reliant on assistive device for balance, Bilateral upper extremity supported Standing balance-Leahy Scale: Poor Standing balance comment: reliant on external assist                             Pertinent Vitals/Pain Pain Assessment Pain Assessment: Faces Faces Pain Scale: Hurts little more Pain Location: generalized with movement Pain Descriptors / Indicators: Discomfort, Grimacing, Guarding Pain Intervention(s): Limited activity within patient's tolerance, Monitored during session, Repositioned    Home Living Family/patient expects to be discharged to:: Private residence Living Arrangements: Alone Available Help at Discharge: Available PRN/intermittently Type of Home: House Home Access: Stairs to enter   [employer] of Steps: 1-2 depending on entrance   Home Layout: One level Home Equipment: Agricultural consultant (2 wheels);Cane - single point;Grab bars - tub/shower Additional Comments: Review information with patient when patient is more alert, also is hard of hearing    Prior Function Prior Level of Function : Independent/Modified Independent             Mobility Comments: walked occasionally with a cane; reports prior fall but unsure when (at least >3 months ago) ADLs Comments: independent     Hand Dominance   Dominant Hand: Right    Extremity/Trunk Assessment   Upper Extremity Assessment Upper Extremity Assessment: Defer to OT evaluation    Lower Extremity Assessment Lower Extremity Assessment: Generalized weakness;Difficult to assess due to impaired cognition (and HOH)    Cervical / Trunk Assessment Cervical / Trunk Assessment: Other exceptions (fractures, in C collar)  Communication   Communication: HOH  Cognition Arousal/Alertness: Lethargic Behavior During Therapy: Flat affect Overall Cognitive Status: Impaired/Different from  baseline Area of Impairment: Attention, Memory, Following commands, Safety/judgement, Orientation, Awareness, Problem solving                 Orientation Level: Time, Disoriented to Current Attention Level: Sustained Memory: Decreased recall of precautions, Decreased short-term memory Following Commands: Follows one step commands with increased time Safety/Judgement: Decreased awareness of safety, Decreased awareness of deficits Awareness: Emergent Problem Solving: Decreased initiation, Requires verbal cues, Difficulty sequencing, Requires tactile cues, Slow processing General Comments: Patient lethargic, but more alert with increased time, also hard of hearing, patient was perseverative about his gown being wet, and increased time to follow all commands        General Comments General comments (skin integrity, edema, etc.): VSS on 1L O2; atttempted RA with sats decr 87% while seated    Exercises     Assessment/Plan    PT Assessment Patient needs continued PT services  PT Problem List Decreased strength;Decreased activity tolerance;Decreased balance;Decreased mobility;Decreased cognition;Decreased knowledge of use of DME;Decreased safety awareness;Decreased knowledge of precautions;Cardiopulmonary status limiting activity;Pain       PT Treatment Interventions DME instruction;Gait training;Stair training;Functional mobility training;Therapeutic activities;Therapeutic exercise;Balance training;Cognitive remediation;Patient/family education    PT Goals (Current goals can be found in the Care Plan section)  Acute Rehab PT Goals Patient Stated Goal: regain mobility PT Goal Formulation: With patient Time For Goal Achievement: 05/05/23 Potential to Achieve Goals: Good    Frequency Min 4X/week     Co-evaluation   Reason for Co-Treatment: Complexity of the patient's impairments (multi-system involvement);Necessary to address cognition/behavior during functional activity;To  address functional/ADL transfers;For patient/therapist safety   OT goals addressed during session: ADL's and self-care;Strengthening/ROM  AM-PAC PT "6 Clicks" Mobility  Outcome Measure Help needed turning from your back to your side while in a flat bed without using bedrails?: A Lot Help needed moving from lying on your back to sitting on the side of a flat bed without using bedrails?: Total Help needed moving to and from a bed to a chair (including a wheelchair)?: Total Help needed standing up from a chair using your arms (e.g., wheelchair or bedside chair)?: Total Help needed to walk in hospital room?: Total Help needed climbing 3-5 steps with a railing? : Total 6 Click Score: 7    End of Session Equipment Utilized During Treatment: Cervical collar Activity Tolerance: Patient tolerated treatment well Patient left: in bed;with call bell/phone within reach;with bed alarm set   PT Visit Diagnosis: Unsteadiness on feet (R26.81);Muscle weakness (generalized) (M62.81);History of falling (Z91.81)    Time: 8295-6213 PT Time Calculation (min) (ACUTE ONLY): 29 min   Charges:   PT Evaluation $PT Eval Low Complexity: 1 Low           Jerolyn Center, PT Acute Rehabilitation Services  Office [phone]   Zena Amos 04/21/2023, 11:52 AM

## 2023-04-21 NOTE — Progress Notes (Signed)
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 Ongoing.   GOAL:   Patient will meet greater than or equal to 90% of their needs Met with TF at goal   MONITOR:   TF tolerance  REASON FOR ASSESSMENT:   Consult Assessment of nutrition requirement/status, Enteral/tube feeding initiation and management  ASSESSMENT:   Pt with ETOH abuse admitted post fall found down in backyard with multiple head lacerations and abrasions to BLE and BUE, C7 and T1 fxs. Found to have chronic SDHs and per CT pt with previous whipple, possible acute interstitial pancreatitis, possible enteritis, and possible ileus vs SBO.  Pt discussed during ICU rounds and with RN and MD.  Pt extubated but failed swallow eval. Spoke with SLP and MD proceed with cortrak placement today.  Renal consulted, giving IVF today Pt still has hard neck collar on to follow up with neurosurgery  Per pt he is hungry. He lives alone and does not cook much.  Breakfast: frozen waffle and coffee Lunch: chicken sandwich Dinner: frozen meal Drinks 3 vodka and tonics after 5 pm   05/04 - found down in backyard, pt with multiple head lacerations and abrasions to BLE and BUE 05/07 - extubated 05/08 - failed swallow; cortrak placed per xray in gastric body. Per xray no bowel obstruction  Medications reviewed and include: folic acid, SSI, MVI with minerals, protonix, thiamine  LR @ 100 ml/hr today stop 5/9  Labs  reviewed:  K 3.9 CBG's: 92-121 Phos: 4.1 --> 2.4 --> 2.0 --> 5.5  UOP: 1850 ml  I&O: +12 L   Diet Order:   Diet Order             Diet NPO time specified Except for: Ice Chips  Diet effective now                   EDUCATION NEEDS:   No education needs have been identified at this time  Skin:  Skin Assessment: Skin Integrity Issues: Skin Integrity Issues:: Incisions Incisions: closed posterior upper head  Last BM:  5/8 x 1 via FMS which was placed 5/5  Height:   Ht Readings from Last 1 Encounters:  04/17/23 6' (1.829 m)    Weight:   Wt Readings from Last 1 Encounters:  04/18/23 69.6 kg    Ideal Body Weight:  80.9 kg  BMI:  Body mass index is 20.81 kg/m.  Estimated Nutritional Needs:   Kcal:  2000-2200  Protein:  105-135 grams  Fluid:  >2 L/day  Cammy Copa., RD, LDN, CNSC See AMiON for contact information

## 2023-04-21 NOTE — Progress Notes (Signed)
Speech Language Pathology Treatment: Dysphagia  Patient Details Name: Cash Brierley MRN: [id number] DOB: Dec 15, 1945 Today's Date: 04/21/2023 Time: 4098-1191 SLP Time Calculation (min) (ACUTE ONLY): 15 min  Assessment / Plan / Recommendation Clinical Impression  Patient seen by SLP for skilled treatment focused on dysphagia goals. He was awake and alert but appeared a little more lethargic than yesterday's session. He c/o "I have a sore in my mouth" and when SLP inspected, appeared to be a blood blister. Today, his right side of face appears more swollen than previous date, but this is purely subjective. SLP completed oral care with no secretions observed and oral mucosa appearing more clean and moist today. SLP assessed his swallow with ice chips, water sips, puree solids (applesauce). He exhibited suspected delayed swallow initiation with all PO's. No overt s/s aspiration or penetration with single ice chips, but patient exhibited delayed coughing with majority of straw and cup sips of thin liquids (water). As patient is still not fully alert and is exhibiting overt s/s aspiration/penetration, recommend continue NPO but allow ice chips PRN.    HPI HPI: Patient is a 77 y.o. male with PMH: ETOH abuse, who presented to the ED on 04/17/23 s/p unwittnessed fall, found down in backyard by unknown bystander with multiple head lacerations and abrasions to BLE and BUE as well as C7 and T1 fractures. CT head showed chronic SDH's, CT abd/chest/pelvis showed prior Whipple procedure, possible acute interstitial pancreatitis, possible enteritis, possible ileus vs low-grade obstruction of small bowel. CXR showed no acute abnormality. He become obtunded after picked up and intubated in ED (extubated 04/20/23 at 0918) . NG feeding tube placed, foley placed, he was given IV fluids and 2 units of blood for presumed bleeding. Neurosurgery recommended no surgical intervention, continue C-collar. He failed Yale swallow with RN  who reported he coughed with sips of water.      SLP Plan  Continue with current plan of care      Recommendations for follow up therapy are one component of a multi-disciplinary discharge planning process, led by the attending physician.  Recommendations may be updated based on patient status, additional functional criteria and insurance authorization.    Recommendations  Diet recommendations: Other(comment) (NPO x ice chips) Liquids provided via: Teaspoon Medication Administration: Via alternative means Supervision: Full supervision/cueing for compensatory strategies Postural Changes and/or Swallow Maneuvers: Seated upright 90 degrees                  Oral care QID;Staff/trained caregiver to provide oral care;Oral care prior to ice chip/H20   Frequent or constant Supervision/Assistance Dysphagia, unspecified (R13.10)     Continue with current plan of care     Angela Nevin, MA, CCC-SLP Speech Therapy

## 2023-04-21 NOTE — Procedures (Signed)
Cortrak  Person Inserting Tube:  Brynli Ollis L, RD Tube Type:  Cortrak - 43 inches Tube Size:  10 Tube Location:  Left nare Secured by: Bridle Technique Used to Measure Tube Placement:  Marking at nare/corner of mouth Cortrak Secured At:  71 cm   Cortrak Tube Team Note:  Consult received to place a Cortrak feeding tube.   X-ray is required, abdominal x-ray has been ordered by the Cortrak team. Please confirm tube placement before using the Cortrak tube.   If the tube becomes dislodged please keep the tube and contact the Cortrak team at [email or web address] for replacement.  If after hours and replacement cannot be delayed, place a NG tube and confirm placement with an abdominal x-ray.    Katielynn Horan RD, LDN Clinical Dietitian See AMiON for contact information.    

## 2023-04-21 NOTE — Progress Notes (Signed)
Inpatient Rehab Admissions Coordinator Note:   Per therapy recommendations patient was screened for CIR candidacy by Stephania Fragmin, PT. At this time, pt appears to be a potential candidate for CIR. I will place an order for rehab consult for full assessment, per our protocol.  Please contact me any with questions.Estill Dooms, PT, DPT [phone] 04/21/23 2:14 PM

## 2023-04-21 NOTE — Progress Notes (Signed)
NAME:  Chad Winters, MRN:  [id number], DOB:  November 25, 1946, LOS: 4 ADMISSION DATE:  04/17/2023, CONSULTATION DATE:  5/4 REFERRING MD:  Dr. Madilyn Hook, CHIEF COMPLAINT:  SDH/facial fx   History of Present Illness:  Patient is a 77 year old male with no history on file presents to [facility] ED on 5/4 with AMS.  5/4 patient found down in backyard.  EMS called and patient transported to [facility] as a level 1 trauma.  Patient w/ multiple lacerations on scalp. BP soft, brady 50s, and hypothermic 90.8 F. Patient given IV fluids and 2 units of blood for presumed bleeding. Patient MAE spontaneously but did not respond to stimuli or voice.  Due to poor GCS patient intubated. Cbg 102. EKG sinus brady w/ qtc prolongation. CXR no acute abnormality. CT head/c-spine showing acute fractures of c7 and t1; Hypodense collection on left frontoparietal 1.2 favoring a chronic SDH. NSG consulted. CT chest/abd/pelvis w/ prior whipple procedure w/ slight edema around pancreatic body and tail concerncing for acute interstitial pancreatitis; possible enteritis; and possible ileus vs. Low-grade obstruction of small bowel; low-density lesion in lower medial aspect of segment 6 of liver recommending MRI w/ and wo contrast. Ethanol level 122. Vbg 6.72, 30, 87, 4. Wbc 15.2, hgb 9.6. Given 2 amps of bicarb. UA unremarkable. Cultures obtained and patient started on broad spectrum abx. Patient remained hypotensive and started on levo. PCCM consulted.   Pertinent ED Labs: na 134, k 3.3, co2 <7, creat 2.03, AST 927, ALT 350  Pertinent  Medical History  No medical hx on file   Significant Hospital Events: Including procedures, antibiotic start and stop dates in addition to other pertinent events   5/4 patient found down w/ AMS; multiple head lacs and abrasions BLE and BUE; Intubated for airway protection; hypotensive on levo; pccm consulted  Interim History / Subjective:  Patient continued to make good amount of urine but his serum creatinine is  trending up Remained afebrile He was successfully extubated yesterday Hemoglobin remained stable  Objective   Blood pressure 109/61, pulse 74, temperature 98.1 F (36.7 C), resp. rate 17, height 6' (1.829 m), weight 69.6 kg, SpO2 97 %.        Intake/Output Summary (Last 24 hours) at 04/21/2023 0847 Last data filed at 04/21/2023 0700 Gross per 24 hour  Intake 1185.34 ml  Output 1500 ml  Net -314.66 ml   Filed Weights   04/17/23 0204 04/17/23 0505 04/18/23 0500  Weight: 68 kg 62.1 kg 69.6 kg    Examination: Physical exam: General: Acute on chronically ill-appearing male, lying on the bed HEENT: S/p staples of Scalp wound, eyes anicteric.  moist mucus membranes.  Hard neck collar in place Neuro: Awake, following commands, confused, moving all 4 extremities Chest: Coarse breath sounds, no wheezes or rhonchi Heart: Regular rate and rhythm, no murmurs or gallops Abdomen: Soft, nontender, nondistended, bowel sounds present Skin: Extensive bruise/lacerations marks noted on all extremities, POA  Labs and images were reviewed  Resolved Hospital Problem list   Septic shock Lactic acidosis/severe metabolic acidosis  Assessment & Plan:  Status post fall Acute C7 and T1 Fractures Head lacerations s/p staples ABLA Chronic bilateral SDH Multiple abrasions/skin tears on BLE and BUE Patient is hard neck collar in place, we will follow with neurosurgery as an outpatient in 3 to 4 weeks  H&H remained stable after 2 units of transfusion few days ago Monitor and transfuse if less than 7 Continue neuro watch Continue wound care  Acute enteritis Patient remained off vasopressors  Antibiotics switched to ceftriaxone Zosyn due to possible AIN Speech and swallow evaluation  Acute metabolic/toxic encephalopathy, improved In the setting of alcohol abuse and acute kidney injury CT head w/ no acute abnormality but with chronic bilateral subdural hematoma UDS showed no substance abuse His  blood alcohol level was 122 upon admission Mental status has improved significantly Continue thiamine and folate  Acute respiratory insufficiency in the setting of encephalopathy Patient was found unresponsive He was not able to protect his airway he was intubated He was successfully extubated yesterday, currently on 2 L nasal cannula oxygen Encourage incentive spirometry Continue PT/OT  Acute kidney injury due to ischemic ATN in the setting of shock Hypokalemia Hypocalcemia Hypophosphatemia Patient made 1.9 L of urine in last 24-hour, without diuretic therapy Serum creatinine continue to trend up, currently at 4.5 Nephrology consulted Monitor intake and output Avoid nephrotoxic agents Continue aggressive electrolyte supplement  Possible pancreatitis: CT abd/pelvis showing prior whipple procedure w/ sligh edema around pancreatic body and tail concerncing for acute interstitial pancreatitis Lipase is slightly elevated Continue tube feeds  Shock liver/alcoholic hepatitis Liver lesion: CT showing low-density lesion in lower medial aspect of segment 6 of liver Will need MRI w/ and wo contrast, liver protocol once stable Monitor LFTs Monitor coagulation panel LFTs have improved  Thrombocytopenia critical illness Platelet count dropped to 108, closely monitor  Moderate malnutrition Continue dietary supplements  Best Practice (right click and "Reselect all SmartList Selections" daily)   Diet/type: NPO continue tube feeds DVT prophylaxis: Subcu Lovenox GI prophylaxis: PPI Lines: N/A Foley:  Yes, and it is still needed Code Status:  full code Last date of multidisciplinary goals of care discussion [5/4: Patient's family was updated at bedside  Labs   CBC: Recent Labs  Lab 04/17/23 0200 04/17/23 0250 04/17/23 1123 04/18/23 0503 04/18/23 1035 04/18/23 1700 04/19/23 0719 04/19/23 0812  WBC 15.2*  --  10.4 11.7*  --   --  10.5  --   NEUTROABS  --   --   --  9.8*  --    --  8.1*  --   HGB 9.6*   < > 7.3* 6.6* 6.5* 9.7* 9.5* 9.2*  HCT 31.1*   < > 20.8* 18.4* 19.0* 26.3* 27.7* 27.0*  MCV 98.1  --  85.2 83.6  --   --  84.5  --   PLT 189  --  153 125*  141*  --   --  108*  --    < > = values in this interval not displayed.    Basic Metabolic Panel: Recent Labs  Lab 04/17/23 0415 04/17/23 0527 04/17/23 1632 04/18/23 0501 04/18/23 0503 04/18/23 1035 04/19/23 0719 04/19/23 0812 04/19/23 1607 04/20/23 0710 04/21/23 0546  NA  --    < > 133* 131*  --  133* 136 135  --  134* 137  K  --    < > 3.3* 3.9  --  3.8 3.3* 3.4*  --  3.5 3.9  CL  --    < > 92* 92*  --   --  88*  --   --  91* 93*  CO2  --    < > 22 21*  --   --  33*  --   --  29 29  GLUCOSE  --    < > 162* 154*  --   --  186*  --   --  118* 92  BUN  --    < > 35* 43*  --   --  53*  --   --  68* 65*  CREATININE  --    < > 2.63* 3.06*  --   --  3.56*  --   --  4.11* 4.50*  CALCIUM  --    < > 7.1* 6.5*  --   --  6.5*  --   --  7.2* 7.8*  MG 2.6*  --   --   --  2.0  --   --   --  2.5* 2.4 2.4  PHOS  --   --   --   --  4.1  --   --   --  2.4* 2.0* 5.5*   < > = values in this interval not displayed.   GFR: Estimated Creatinine Clearance: 14 mL/min (A) (by C-G formula based on SCr of 4.5 mg/dL (H)). Recent Labs  Lab 04/17/23 0200 04/17/23 0249 04/17/23 0415 04/17/23 0652 04/17/23 1123 04/17/23 1328 04/18/23 0503 04/18/23 1124 04/18/23 1514 04/19/23 0719  PROCALCITON  --   --  0.13  --   --   --   --   --   --   --   WBC 15.2*  --   --   --  10.4  --  11.7*  --   --  10.5  LATICACIDVEN  --    < > >9.0*   < > >9.0* 6.5*  --  3.1* 2.3*  --    < > = values in this interval not displayed.    Liver Function Tests: Recent Labs  Lab 04/17/23 0200 04/17/23 1123 04/20/23 0710  AST 927* 3,014* 418*  ALT 350* 1,017* 46*  ALKPHOS 111 111 100  BILITOT 0.4 1.0 0.8  PROT 4.7* 3.9* 4.8*  ALBUMIN 1.8* 1.5* <1.5*   Recent Labs  Lab 04/17/23 0415  LIPASE 197*   Recent Labs  Lab  04/17/23 0415  AMMONIA 143*    ABG    Component Value Date/Time   PHART 7.564 (H) 04/19/2023 0812   PCO2ART 39.9 04/19/2023 0812   PO2ART 91 04/19/2023 0812   HCO3 36.1 (H) 04/19/2023 0812   TCO2 37 (H) 04/19/2023 0812   ACIDBASEDEF 6.0 (H) 04/17/2023 1106   O2SAT 98 04/19/2023 0812     Coagulation Profile: Recent Labs  Lab 04/17/23 0652 04/18/23 0503  INR 1.6* 1.6*    Cardiac Enzymes: Recent Labs  Lab 04/17/23 0415  CKTOTAL 359    HbA1C: No results found for: "HGBA1C"  CBG: Recent Labs  Lab 04/20/23 1608 04/20/23 1913 04/20/23 2316 04/21/23 0313 04/21/23 0824  GLUCAP 99 96 85 98 92    This patient is critically ill with multiple organ system failure which requires frequent high complexity decision making, assessment, support, evaluation, and titration of therapies. This was completed through the application of advanced monitoring technologies and extensive interpretation of multiple databases.  During this encounter critical care time was devoted to patient care services described in this note for 32 minutes.    Cheri Fowler, MD [facility] See Amion for pager If no response to pager, please call [phone] until 7pm After 7pm, Please call E-link [phone]

## 2023-04-22 DIAGNOSIS — R6521 Severe sepsis with septic shock: Secondary | ICD-10-CM | POA: Diagnosis not present

## 2023-04-22 DIAGNOSIS — A419 Sepsis, unspecified organism: Secondary | ICD-10-CM | POA: Diagnosis not present

## 2023-04-22 DIAGNOSIS — N179 Acute kidney failure, unspecified: Secondary | ICD-10-CM | POA: Diagnosis not present

## 2023-04-22 LAB — MAGNESIUM: Magnesium: 2.4 mg/dL (ref 1.7–2.4)

## 2023-04-22 LAB — CBC WITH DIFFERENTIAL/PLATELET
Abs Immature Granulocytes: 0.1 10*3/uL — ABNORMAL HIGH (ref 0.00–0.07)
Basophils Absolute: 0 10*3/uL (ref 0.0–0.1)
Basophils Relative: 1 %
Eosinophils Absolute: 0.2 10*3/uL (ref 0.0–0.5)
Eosinophils Relative: 3 %
HCT: 27.1 % — ABNORMAL LOW (ref 39.0–52.0)
Hemoglobin: 9.2 g/dL — ABNORMAL LOW (ref 13.0–17.0)
Immature Granulocytes: 2 %
Lymphocytes Relative: 18 %
Lymphs Abs: 1.2 10*3/uL (ref 0.7–4.0)
MCH: 29.9 pg (ref 26.0–34.0)
MCHC: 33.9 g/dL (ref 30.0–36.0)
MCV: 88 fL (ref 80.0–100.0)
Monocytes Absolute: 0.9 10*3/uL (ref 0.1–1.0)
Monocytes Relative: 14 %
Neutro Abs: 4.3 10*3/uL (ref 1.7–7.7)
Neutrophils Relative %: 62 %
Platelets: 155 10*3/uL (ref 150–400)
RBC: 3.08 MIL/uL — ABNORMAL LOW (ref 4.22–5.81)
RDW: 15.9 % — ABNORMAL HIGH (ref 11.5–15.5)
WBC: 6.8 10*3/uL (ref 4.0–10.5)
nRBC: 0 % (ref 0.0–0.2)

## 2023-04-22 LAB — BASIC METABOLIC PANEL
Anion gap: 14 (ref 5–15)
BUN: 67 mg/dL — ABNORMAL HIGH (ref 8–23)
CO2: 28 mmol/L (ref 22–32)
Calcium: 8.2 mg/dL — ABNORMAL LOW (ref 8.9–10.3)
Chloride: 98 mmol/L (ref 98–111)
Creatinine, Ser: 4.35 mg/dL — ABNORMAL HIGH (ref 0.61–1.24)
GFR, Estimated: 13 mL/min — ABNORMAL LOW (ref >60)
Glucose, Bld: 150 mg/dL — ABNORMAL HIGH (ref 70–99)
Potassium: 3.4 mmol/L — ABNORMAL LOW (ref 3.5–5.1)
Sodium: 140 mmol/L (ref 135–145)

## 2023-04-22 LAB — GLUCOSE, CAPILLARY
Glucose-Capillary: 127 mg/dL — ABNORMAL HIGH (ref 70–99)
Glucose-Capillary: 155 mg/dL — ABNORMAL HIGH (ref 70–99)
Glucose-Capillary: 159 mg/dL — ABNORMAL HIGH (ref 70–99)
Glucose-Capillary: 168 mg/dL — ABNORMAL HIGH (ref 70–99)
Glucose-Capillary: 169 mg/dL — ABNORMAL HIGH (ref 70–99)
Glucose-Capillary: 205 mg/dL — ABNORMAL HIGH (ref 70–99)

## 2023-04-22 LAB — CULTURE, BLOOD (ROUTINE X 2)
Culture: NO GROWTH
Culture: NO GROWTH

## 2023-04-22 LAB — PHOSPHORUS: Phosphorus: 5.2 mg/dL — ABNORMAL HIGH (ref 2.5–4.6)

## 2023-04-22 MED ORDER — GERHARDT'S BUTT CREAM
TOPICAL_CREAM | CUTANEOUS | Status: DC | PRN
  Filled 2023-04-22: qty 1

## 2023-04-22 MED ORDER — POTASSIUM CHLORIDE 20 MEQ PO PACK
40.0000 meq | PACK | Freq: Every day | ORAL | Status: DC
  Administered 2023-04-22 – 2023-04-23 (×2): 40 meq
  Filled 2023-04-22 (×2): qty 2

## 2023-04-22 NOTE — Progress Notes (Signed)
PROGRESS NOTE    Chad Winters  ION:[id number] DOB: 1946/11/30 DOA: 04/17/2023 PCP: Donia Ast, MD   Brief Narrative: No notes on file   Assessment and Plan:  Head trauma Head lacerations Present on admission. Evaluated by trauma surgery. 7 staples placed by EDP on 5/4.  Acute C7 and T1 fractures Neurosurgery consulted and recommended a cervical collar with further recommendations for outpatient follow-up in clinic in 4 weeks. -Continue cervical collar  Chronic subdural hematomas/hygromas Noted. No acute management  Acute blood loss anemia Secondary to trauma and subsequent blood loss. Patient with a hemoglobin down to 6.5 requiring transfusion of 2 units of PRBC. Hemoglobin stable.  Acute enteritis Patient managed on empiric antibiotics with Vancomycin, Zosyn and Cefazolin. No culture data available. Antibiotics transitioned to Linezolid and Zosyn before finally transitioning to Ceftriaxone. WBC trended down. -Continue Ceftriaxone  Septic shock Present on admission. Secondary to acute enteritis. Associated hypothermia. Patient required vasopressor support while in ICU and has weaned off successfully. Shock resolved. Blood cultures with no growth.  Acute metabolic/toxic encephalopathy Appears to be significantly improved.   Acute respiratory failure with hypoxia Patient required intubation and mechanical ventilation on admission (5/4). He was successfully extubated on 5/7. He is now stable on nasal canula -Wean to room air  AKI Secondary to possible AIN per nephrology. Complicated by septic shock. Creatinine peak of 4.50. Nephrology consulted and have recommended IV fluids. Creatinine down to 4.35 today.  Dysphagia NG tube placed on 5/8 and patient is currently managed on tube feeds. SLP has now recommended a full liquid diet -Continue full liquid diet -If tolerating oral diet, will discontinue tube feeds  Hypokalemia -Potassium  supplementation  Hypocalcemia Normal once corrected for hypoalbuminemia  Hypophosphatemia Resolved with phosphorus repletion, however now with mild hyperphosphatemia.  Possible pancreatitis Noted CT evidence of slight edema of pancreatic body and tail with mildly elevated lipase. No symptoms consistent with pancreatitis at this time.  Shock liver Secondary to septic shock. AST/ALT peak of 3,014/1,017 respectively with significant improvement.  Liver lesion Noted on CT imaging with recommendation for MRI w/wo contrast for further evaluation -Will obtain MRI abdomen w/wo contrast once kidney function is improved  Thrombocytopenia Secondary to critical illness. Platelets down to 108,000. Thrombocytopenia now resolved.  Moderate malnutrition Noted. Patient is currently on tube feeds secondary to dysphagia  DVT prophylaxis: Lovenox Code Status:   Code Status: Full Code Family Communication: None at bedside Disposition Plan: Discharge likely in 3-5 days pending improvement of AKI. Anticipate discharge to acute inpatient rehab   Consultants:  PCCM Nephrology Trauma Neurosurgery  Procedures:    Antimicrobials: Vancomycin Zosyn Cefazolin Ceftriaxone    Subjective: Patient reports no specific concerns. Had a good night.   Objective: BP (!) 145/72 (BP Location: Left Arm)   Pulse 78   Temp 97.6 F (36.4 C) (Oral)   Resp (!) 24   Ht 6' (1.829 m)   Wt 69.6 kg   SpO2 93%   BMI 20.81 kg/m   Examination:  General exam: Appears calm and comfortable Respiratory system: Clear to auscultation. Respiratory effort normal. Cardiovascular system: S1 & S2 heard, RRR. No murmurs. Gastrointestinal system: Abdomen is nondistended, soft and nontender. Normal bowel sounds heard. Central nervous system: Alert and oriented. Musculoskeletal: No edema. No calf tenderness Skin: No cyanosis. No rashes  Data Reviewed: I have personally reviewed following labs and imaging  studies  CBC Lab Results  Component Value Date   WBC 6.8 04/22/2023   RBC 3.08 (L) 04/22/2023  HGB 9.2 (L) 04/22/2023   HCT 27.1 (L) 04/22/2023   MCV 88.0 04/22/2023   MCH 29.9 04/22/2023   PLT 155 04/22/2023   MCHC 33.9 04/22/2023   RDW 15.9 (H) 04/22/2023   LYMPHSABS 1.2 04/22/2023   MONOABS 0.9 04/22/2023   EOSABS 0.2 04/22/2023   BASOSABS 0.0 04/22/2023     Last metabolic panel Lab Results  Component Value Date   NA 140 04/22/2023   K 3.4 (L) 04/22/2023   CL 98 04/22/2023   CO2 28 04/22/2023   BUN 67 (H) 04/22/2023   CREATININE 4.35 (H) 04/22/2023   GLUCOSE 150 (H) 04/22/2023   GFRNONAA 13 (L) 04/22/2023   CALCIUM 8.2 (L) 04/22/2023   PHOS 5.2 (H) 04/22/2023   PROT 4.8 (L) 04/20/2023   ALBUMIN <1.5 (L) 04/20/2023   BILITOT 0.8 04/20/2023   ALKPHOS 100 04/20/2023   AST 418 (H) 04/20/2023   ALT 46 (H) 04/20/2023   ANIONGAP 14 04/22/2023    GFR: Estimated Creatinine Clearance: 14.4 mL/min (A) (by C-G formula based on SCr of 4.35 mg/dL (H)).  Recent Results (from the past 240 hour(s))  Culture, blood (routine x 2)     Status: None   Collection Time: 04/17/23  2:51 AM   Specimen: BLOOD RIGHT HAND  Result Value Ref Range Status   Specimen Description BLOOD RIGHT HAND  Final   Special Requests   Final    BOTTLES DRAWN AEROBIC AND ANAEROBIC Blood Culture adequate volume   Culture   Final    NO GROWTH 5 DAYS Performed at [facility], [address], [place], [place] [address]    Report Status 04/22/2023 FINAL  Final  Culture, blood (routine x 2)     Status: None   Collection Time: 04/17/23  2:56 AM   Specimen: BLOOD  Result Value Ref Range Status   Specimen Description BLOOD L FIN  Final   Special Requests   Final    BOTTLES DRAWN AEROBIC ONLY Blood Culture results may not be optimal due to an inadequate volume of blood received in culture bottles   Culture   Final    NO GROWTH 5 DAYS Performed at [facility], [address]  [place], [place] [address]    Report Status 04/22/2023 FINAL  Final  MRSA Next Gen by PCR, Nasal     Status: None   Collection Time: 04/17/23  5:24 AM   Specimen: Nasal Mucosa; Nasal Swab  Result Value Ref Range Status   MRSA by PCR Next Gen NOT DETECTED NOT DETECTED Final    Comment: (NOTE) The GeneXpert MRSA Assay (FDA approved for NASAL specimens only), is one component of a comprehensive MRSA colonization surveillance program. It is not intended to diagnose MRSA infection nor to guide or monitor treatment for MRSA infections. Test performance is not FDA approved in patients less than 73 years old. Performed at [facility], [address] [place], [place] [address]   Urine Culture (for pregnant, neutropenic or urologic patients or patients with an indwelling urinary catheter)     Status: None   Collection Time: 04/17/23  1:30 PM   Specimen: Urine, Clean Catch  Result Value Ref Range Status   Specimen Description URINE, CLEAN CATCH  Final   Special Requests NONE  Final   Culture   Final    NO GROWTH Performed at [facility], [address] [place], [place] [address]    Report Status 04/19/2023 FINAL  Final      Radiology Studies: DG Abd Portable 1V  Result Date: 04/21/2023 CLINICAL DATA:  Feeding tube placement EXAM: PORTABLE ABDOMEN - 1 VIEW COMPARISON:  CT 04/17/2023 FINDINGS: Semi-erect artifact degraded view of the abdomen and upper pelvis with multiple wires and leads projecting over the chest. Feeding tube terminates at the gastric body. No gross free intraperitoneal air or bowel obstruction. Cardiomegaly. IMPRESSION: Feeding tube terminating at the gastric body. Electronically Signed   By: Jeronimo Greaves M.D.   On: 04/21/2023 14:52      LOS: 5 days    Jacquelin Hawking, MD [facility] 04/22/2023, 9:48 AM   If 7PM-7AM, please contact night-coverage [email or web address]

## 2023-04-22 NOTE — Progress Notes (Signed)
Physical Therapy Treatment Patient Details Name: Chad Winters MRN: [id number] DOB: [birth year] Today's Date: 04/22/2023   History of Present Illness Patient presenting to ED on 04/17/23 after being found down in his backyard. Patient hypothermic, bradycardic, and intubated. Multiple lesions on scalp, BUEs, and BLEs. CT head/c-spine showing acute compression fractures of c7 and T1, neurosurgery recommending C collar. CT chest/abd/pelvis w/ prior whipple procedure w/ slight edema around pancreatic body and tail concerncing for acute interstitial pancreatitis; possible enteritis; and possible ileus vs. Low-grade obstruction of small bowel; low-density lesion in liver. MRI is pending. No PMH on file.    PT Comments    The pt was agreeable to session, made great progress with sit-stand transfer and activity tolerance this session. Continues to need increased cues for sequencing and initiating movements. Will benefit from continued skilled PT to progress endurance and balance for gait training.     Recommendations for follow up therapy are one component of a multi-disciplinary discharge planning process, led by the attending physician.  Recommendations may be updated based on patient status, additional functional criteria and insurance authorization.  Follow Up Recommendations       Assistance Recommended at Discharge Frequent or constant Supervision/Assistance (due to decr cognition, mobility)  Patient can return home with the following Two people to help with walking and/or transfers;A lot of help with bathing/dressing/bathroom;Assistance with cooking/housework;Direct supervision/assist for medications management;Direct supervision/assist for financial management;Assist for transportation;Help with stairs or ramp for entrance   Equipment Recommendations  Rolling walker (2 wheels)    Recommendations for Other Services Rehab consult     Precautions / Restrictions Precautions Precautions:  Fall;Cervical;Other (comment) Precaution Booklet Issued: No Precaution Comments: cervical precautions reviewed Required Braces or Orthoses: Cervical Brace Cervical Brace: Hard collar;At all times Restrictions Weight Bearing Restrictions: No     Mobility  Bed Mobility Overal bed mobility: Needs Assistance Bed Mobility: Rolling, Sidelying to Sit Rolling: Mod assist Sidelying to sit: Mod assist       General bed mobility comments: modA to roll and come to sitting EOB, good use of UE    Transfers Overall transfer level: Needs assistance Equipment used: Ambulation equipment used Transfers: Sit to/from Stand, Bed to chair/wheelchair/BSC Sit to Stand: Min assist           General transfer comment: minA to rise with use of stedy. mcompleted multiple within stedy from flaps, bed, and chair Transfer via Lift Equipment: Stedy  Ambulation/Gait             Pre-gait activities: standing marches in stedy for safety with only +1     Stairs             Wheelchair Mobility    Modified Rankin (Stroke Patients Only)       Balance Overall balance assessment: Needs assistance Sitting-balance support: Bilateral upper extremity supported, Feet supported Sitting balance-Leahy Scale: Fair     Standing balance support: During functional activity, Reliant on assistive device for balance, Bilateral upper extremity supported Standing balance-Leahy Scale: Poor Standing balance comment: reliant on external assist                            Cognition Arousal/Alertness: Lethargic Behavior During Therapy: Flat affect Overall Cognitive Status: Impaired/Different from baseline Area of Impairment: Attention, Memory, Following commands, Safety/judgement, Orientation, Awareness, Problem solving                 Orientation Level: Time, Disoriented to Current Attention Level: Sustained  Memory: Decreased recall of precautions, Decreased short-term  memory Following Commands: Follows one step commands with increased time Safety/Judgement: Decreased awareness of safety, Decreased awareness of deficits Awareness: Emergent Problem Solving: Decreased initiation, Requires verbal cues, Difficulty sequencing, Requires tactile cues, Slow processing General Comments: pt is hard of hearing, increased time for command following, did not mention being soiled        Exercises      General Comments General comments (skin integrity, edema, etc.): SpO2 to 87% on RA, Stable on 2L      Pertinent Vitals/Pain Pain Assessment Faces Pain Scale: Hurts little more Pain Location: generalized with movement Pain Descriptors / Indicators: Discomfort, Grimacing, Guarding Pain Intervention(s): Monitored during session, Limited activity within patient's tolerance, Repositioned     PT Goals (current goals can now be found in the care plan section) Acute Rehab PT Goals Patient Stated Goal: regain mobility PT Goal Formulation: With patient Time For Goal Achievement: 05/05/23 Potential to Achieve Goals: Good Progress towards PT goals: Progressing toward goals    Frequency    Min 4X/week      PT Plan Current plan remains appropriate    Co-evaluation              AM-PAC PT "6 Clicks" Mobility   Outcome Measure  Help needed turning from your back to your side while in a flat bed without using bedrails?: A Little Help needed moving from lying on your back to sitting on the side of a flat bed without using bedrails?: A Lot Help needed moving to and from a bed to a chair (including a wheelchair)?: A Lot Help needed standing up from a chair using your arms (e.g., wheelchair or bedside chair)?: A Lot Help needed to walk in hospital room?: Total Help needed climbing 3-5 steps with a railing? : Total 6 Click Score: 11    End of Session Equipment Utilized During Treatment: Cervical collar Activity Tolerance: Patient tolerated treatment  well Patient left: in chair;with call bell/phone within reach;with chair alarm set Nurse Communication: Mobility status PT Visit Diagnosis: Unsteadiness on feet (R26.81);Muscle weakness (generalized) (M62.81);History of falling (Z91.81)     Time: 5409-8119 PT Time Calculation (min) (ACUTE ONLY): 32 min  Charges:  $Therapeutic Exercise: 23-37 mins                     Vickki Muff, PT, DPT   Acute Rehabilitation Department Office [phone] Secure Chat Communication Preferred   Ronnie Derby 04/22/2023, 4:23 PM

## 2023-04-22 NOTE — Progress Notes (Signed)
Speech Language Pathology Treatment: Dysphagia  Patient Details Name: Chad Winters MRN: [id number] DOB: Sep 02, 1946 Today's Date: 04/22/2023 Time: 1410-1430 SLP Time Calculation (min) (ACUTE ONLY): 20 min  Assessment / Plan / Recommendation Clinical Impression  Patient seen by SLP for skilled treatment focused on dysphagia goals. Patient was awake, alert, voice sounding much better: strong and pretty clear. He seemed more cognizant of what was going on and was able to tell SLP basic information of MD and PT visits earlier today. SLP assessed patient's PO toleration with thin liquids and puree solids. After setup, patient able feed self; drinking via straw sips and eating applesauce with spoon. No overt s/s aspiration or penetration observed during or after PO intake, no change in voice or vitals. Patient does not have any audible secretions when talking or when cued to throat clear. SLP recommending advance to full liquids (thin consistency) and will continue to follow for ability to advance.     HPI HPI: Patient is a 77 y.o. male with PMH: ETOH abuse, who presented to the ED on 04/17/23 s/p unwittnessed fall, found down in backyard by unknown bystander with multiple head lacerations and abrasions to BLE and BUE as well as C7 and T1 fractures. CT head showed chronic SDH's, CT abd/chest/pelvis showed prior Whipple procedure, possible acute interstitial pancreatitis, possible enteritis, possible ileus vs low-grade obstruction of small bowel. CXR showed no acute abnormality. He become obtunded after picked up and intubated in ED (extubated 04/20/23 at 0918) . NG feeding tube placed, foley placed, he was given IV fluids and 2 units of blood for presumed bleeding. Neurosurgery recommended no surgical intervention, continue C-collar. He failed Yale swallow with RN who reported he coughed with sips of water.      SLP Plan  Continue with current plan of care      Recommendations for follow up therapy are one  component of a multi-disciplinary discharge planning process, led by the attending physician.  Recommendations may be updated based on patient status, additional functional criteria and insurance authorization.    Recommendations  Diet recommendations: Thin liquid;Other(comment) (full liquids) Liquids provided via: Straw;Cup Medication Administration: Whole meds with puree Supervision: Intermittent supervision to cue for compensatory strategies Compensations: Slow rate;Small sips/bites Postural Changes and/or Swallow Maneuvers: Seated upright 90 degrees                  Oral care BID;Staff/trained caregiver to provide oral care   Frequent or constant Supervision/Assistance Dysphagia, unspecified (R13.10)     Continue with current plan of care     Angela Nevin, MA, CCC-SLP Speech Therapy

## 2023-04-22 NOTE — Progress Notes (Signed)
IP rehab admissions - Noted patient confused today.  I called his daughter to discuss rehab options.  I left a message for daughter to return my call.  I will follow up after I hear back from daughter.  Call for questions.  [phone]

## 2023-04-22 NOTE — Progress Notes (Addendum)
Crt slightly better. Finish out hydration and continue holding zosyn. UA with some blood but foley was removed that morning. Will repeat tomorrow. Unclear cause of proteinuria but ATN possibly contributing. Likely better to evaluate in the outpatient setting once aki resolves.  I have personally seen and examined this patient and agree with the assessment/plan as outlined below by Chad Winters. I performed the exam and obtained the details provided personally. I made changes to the note as appropriate.Chad Amass Norvel Wenker,MD 04/22/2023 12:31 PM   Nephrology Follow-Up Consult note   Assessment/Recommendations: [name] is a/an 77 y.o. male with a past medical history of ETOH use, Renal CA, and HTN who presents w/ Acute encephalopathy   Non-Oliguric AKI (stable): Likely multifactorial components from septic shock, blood loss, general low volume, acute interstitial pancreatitis, and made worse by low albumin and contrast. Overall, seems to be a pre-renal cause, possible component of ATN. Once fluid status is stabilized, I believe that so will his renal function. -Chart reviewed: (medications acceptable, RFs as above).   -Continue to monitor daily Cr, Dose meds for GFR -Monitor Daily I/Os, Daily weight  -Maintain MAP>65 for optimal renal perfusion.  -Avoid nephrotoxic medications including NSAIDs and contrast -Use synthetic opioids (Fentanyl/Dilaudid) if needed -Currently no indication for HD - Baseline Cr probably ~1.5, current nadir of 2.03 and recent peak of 4.50  -Cr 4.35 improving - Phos 5.5>5.2- CTM - Due to concern for possible AIN, Zosyn has been switched to Ceftriaxone, appreciate aid of primary team - Finish up hydration today and will reassess tomorrow - UA w/ microscopy- still slightly dehydrated continue hydration efforts as above  - Will reorder UA tomorrow, presence of RBCs may be due to foley removal - Urine protein/cr ratio 3.34- significant proteinuria should be  followed outpt, could be due to presence of AKI  Septic shock- BP resolved, no longer on pressors. Aim for MAP>65 as above.   Hx of Renal CA- pt was not able to produce many details, had a partial resection and no chemo.   Metabolic acidosis 2/2 lactic acidosis- resolved. Agree with further bicarb if it recurs.   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 CTM. - 3.9>3.4 -Replete with Klor-Con   Hypocalcemia- Currently uptrending though hypoalbuminemia continues to make it appear lower than it really is, last ionized calcium 0.88. Will CTM.   Volume Status: Volume status improved on exam. Based on our examination and review of available imaging, our recommendation is as above.  Recommendations conveyed to primary service.    Chad Lucy Rothfuss PA Student [facility] 04/22/2023 9:36 AM  ___________________________________________________________  CC: Acute encephalopathy   History of Present Illness: Chad Winters is a/an 77 y.o. male with a past medical history of alcohol use and Renal CA who presented to the ED on 5/4 arriving by EMS. Pt was found unconscious on the ground outside in his backyard via a bystander. Pt presented as hypotensive, bradycardic, and hypothermic with multiple lacerations to UE, LE, and a scalp laceration. ETOH level 122. Pt ended up requiring IV fluids and two units of blood for blood loss. Pt was intubated and had an extensive trauma workup including a CT CAP w/ contrast, the rest of imaging w/o contrast. Due to hypotension pt was being stabilized on pressors (levofed). Pt was having low UOP but with the help of a lasix trial, which he is now off of. Since then pt has had good UOP., been extubated, and is off of pressors. 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   Pt is pleasant on examination, seen in hospital room.  Seems ot be feeling better today. Pt notes that he does not feel as dry. Urine in room was noted to be frothy appearing. Pt denies N/V, CP, SOB. Pt noted no other questions.   Medications:  Current Facility-Administered Medications  Medication Dose Route Frequency Provider Last Rate Last Admin   0.9 %  sodium chloride infusion  250 mL Intravenous Continuous Pia Mau D, PA-C       cefTRIAXone (ROCEPHIN) 2 g in sodium chloride 0.9 % 100 mL IVPB  2 g Intravenous Q24H Cheri Fowler, MD 200 mL/hr at 04/22/23 0914 2 g at 04/22/23 0914   Chlorhexidine Gluconate Cloth 2 % PADS 6 each  6 each Topical Daily Paliwal, Aditya, MD   6 each at 04/22/23 0900   docusate (COLACE) 50 MG/5ML liquid 100 mg  100 mg Per Tube BID PRN Pia Mau D, PA-C       enoxaparin (LOVENOX) injection 30 mg  30 mg Subcutaneous Q24H Cheri Fowler, MD   30 mg at 04/22/23 0858   feeding supplement (PIVOT 1.5 CAL) liquid 1,000 mL  1,000 mL Per Tube Continuous Cheri Fowler, MD 60 mL/hr at 04/22/23 0900 Infusion Verify at 04/22/23 0900   fentaNYL (SUBLIMAZE) bolus via infusion 25-100 mcg  25-100 mcg Intravenous Q15 min PRN Pia Mau D, PA-C   100 mcg at 04/17/23 2256   fiber supplement (BANATROL TF) liquid 60 mL  60 mL Per Tube BID Cheri Fowler, MD   60 mL at 04/22/23 0859   folic acid injection 1 mg  1 mg Intravenous Daily Pia Mau D, PA-C   1 mg at 04/21/23 1345   free water 250 mL  250 mL Per Tube Q6H Chand, Garnet Sierras, MD   250 mL at 04/22/23 0900   hydrogen peroxide 3 % external solution   Topical BID Bedelia Person, MD   Given at 04/22/23 0915   insulin aspart (novoLOG) injection 0-9 Units  0-9 Units Subcutaneous Q4H Cheri Fowler, MD   2 Units at 04/22/23 4401   lactated ringers infusion   Intravenous Continuous Darnell Level, MD 100 mL/hr at 04/22/23 0900 Infusion Verify at 04/22/23 0900   midazolam (VERSED) injection 1 mg  1 mg Intravenous Q2H PRN Paliwal, Eliezer Lofts, MD       multivitamin with minerals tablet 1 tablet   1 tablet Per Tube Daily Pia Mau D, PA-C   1 tablet at 04/22/23 0859   mupirocin ointment (BACTROBAN) 2 %   Topical BID Bedelia Person, MD   Given at 04/21/23 2123   Oral care mouth rinse  15 mL Mouth Rinse 4 times per day Cheri Fowler, MD   15 mL at 04/22/23 0900   Oral care mouth rinse  15 mL Mouth Rinse PRN Cheri Fowler, MD       pantoprazole (PROTONIX) injection 40 mg  40 mg Intravenous QHS Pia Mau D, PA-C   40 mg at 04/21/23 2123   polyethylene glycol (MIRALAX / GLYCOLAX) packet 17 g  17 g Per Tube Daily PRN Lidia Collum, PA-C       thiamine (VITAMIN B1) injection 100 mg  100 mg Intravenous Daily Pia Mau D, PA-C   100 mg at 04/22/23 0272      Review of Systems: ROS  grossly negative except per HPI  Physical Exam: Vitals:   04/21/23 2314 04/22/23 0304  BP: (!) 119/94 (!) 145/72  Pulse: 79 78  Resp: 17 (!) 24  Temp: 97.7 F (36.5 C) 97.6 F (36.4 C)  SpO2: 97% 93%   Total I/O In: 1758.2 [I.V.:1098.2; NG/GT:660] Out: -   Intake/Output Summary (Last 24 hours) at 04/22/2023 0936 Last data filed at 04/22/2023 0900 Gross per 24 hour  Intake 2974.48 ml  Output 1050 ml  Net 1924.48 ml   Constitutional: well-appearing, no acute distress ENMT: ears and nose without scars or lesions, lips fuller w/ fluid- consistent with increased hydration CV: normal rate, no edema Respiratory: clear to auscultation, normal work of breathing. Sounds of upper airway mucous noted on auscultation. Gastrointestinal: soft, non-tender, no palpable masses or hernias Skin: no visible lesions or rashes Psych: alert, judgement/insight appropriate, appropriate mood and affect   Test Results I personally reviewed new and old clinical labs and radiology tests Lab Results  Component Value Date   NA 140 04/22/2023   K 3.4 (L) 04/22/2023   CL 98 04/22/2023   CO2 28 04/22/2023   BUN 67 (H) 04/22/2023   CREATININE 4.35 (H) 04/22/2023   CALCIUM 8.2 (L) 04/22/2023   ALBUMIN <1.5 (L)  04/20/2023   PHOS 5.2 (H) 04/22/2023    CBC Recent Labs  Lab 04/18/23 0503 04/18/23 1035 04/19/23 0719 04/19/23 0812 04/22/23 0432  WBC 11.7*  --  10.5  --  6.8  NEUTROABS 9.8*  --  8.1*  --  4.3  HGB 6.6*   < > 9.5* 9.2* 9.2*  HCT 18.4*   < > 27.7* 27.0* 27.1*  MCV 83.6  --  84.5  --  88.0  PLT 125*  141*  --  108*  --  155   < > = values in this interval not displayed.

## 2023-04-23 ENCOUNTER — Inpatient Hospital Stay (HOSPITAL_COMMUNITY): Payer: Medicare HMO

## 2023-04-23 DIAGNOSIS — G9340 Encephalopathy, unspecified: Secondary | ICD-10-CM | POA: Diagnosis not present

## 2023-04-23 LAB — BASIC METABOLIC PANEL
Anion gap: 16 — ABNORMAL HIGH (ref 5–15)
BUN: 69 mg/dL — ABNORMAL HIGH (ref 8–23)
CO2: 23 mmol/L (ref 22–32)
Calcium: 8.1 mg/dL — ABNORMAL LOW (ref 8.9–10.3)
Chloride: 101 mmol/L (ref 98–111)
Creatinine, Ser: 4.29 mg/dL — ABNORMAL HIGH (ref 0.61–1.24)
GFR, Estimated: 14 mL/min — ABNORMAL LOW (ref >60)
Glucose, Bld: 142 mg/dL — ABNORMAL HIGH (ref 70–99)
Potassium: 3 mmol/L — ABNORMAL LOW (ref 3.5–5.1)
Sodium: 140 mmol/L (ref 135–145)

## 2023-04-23 LAB — GLUCOSE, CAPILLARY
Glucose-Capillary: 141 mg/dL — ABNORMAL HIGH (ref 70–99)
Glucose-Capillary: 121 mg/dL — ABNORMAL HIGH (ref 70–99)
Glucose-Capillary: 161 mg/dL — ABNORMAL HIGH (ref 70–99)
Glucose-Capillary: 87 mg/dL (ref 70–99)
Glucose-Capillary: 123 mg/dL — ABNORMAL HIGH (ref 70–99)
Glucose-Capillary: 97 mg/dL (ref 70–99)

## 2023-04-23 LAB — URINALYSIS, ROUTINE W REFLEX MICROSCOPIC
Bacteria, UA: NONE SEEN
Bilirubin Urine: NEGATIVE
Glucose, UA: NEGATIVE mg/dL
Ketones, ur: NEGATIVE mg/dL
Leukocytes,Ua: NEGATIVE
Nitrite: NEGATIVE
Protein, ur: 100 mg/dL — AB
Specific Gravity, Urine: 1.01 (ref 1.005–1.030)
pH: 8 (ref 5.0–8.0)

## 2023-04-23 LAB — PHOSPHORUS: Phosphorus: 2.7 mg/dL (ref 2.5–4.6)

## 2023-04-23 LAB — MAGNESIUM: Magnesium: 2.2 mg/dL (ref 1.7–2.4)

## 2023-04-23 MED ORDER — FOLIC ACID 1 MG PO TABS
1.0000 mg | ORAL_TABLET | Freq: Every day | ORAL | Status: DC
  Administered 2023-04-24 – 2023-04-28 (×5): 1 mg
  Filled 2023-04-23 (×5): qty 1

## 2023-04-23 MED ORDER — POTASSIUM CHLORIDE 20 MEQ PO PACK
40.0000 meq | PACK | ORAL | Status: AC
  Administered 2023-04-23 (×2): 40 meq via ORAL
  Filled 2023-04-23 (×2): qty 2

## 2023-04-23 MED ORDER — ENSURE ENLIVE PO LIQD
237.0000 mL | Freq: Three times a day (TID) | ORAL | Status: DC
  Administered 2023-04-23 – 2023-04-29 (×15): 237 mL via ORAL

## 2023-04-23 MED ORDER — THIAMINE MONONITRATE 100 MG PO TABS
100.0000 mg | ORAL_TABLET | Freq: Every day | ORAL | Status: DC
  Administered 2023-04-24 – 2023-04-28 (×5): 100 mg
  Filled 2023-04-23 (×5): qty 1

## 2023-04-23 NOTE — Progress Notes (Signed)
Nutrition Follow-up  DOCUMENTATION CODES:   Non-severe (moderate) malnutrition in context of social or environmental circumstances  INTERVENTION:   - 48-hour calorie count, RD to follow up with results on Monday, 04/26/23  - Recommend nocturnal tube feeds. Per discussion with MD, plan is to d/c tube feeding orders but keep Cortrak in place for now.  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  Ongoing, being addressed via diet advancement and oral nutrition supplements  GOAL:   Patient will meet greater than or equal to 90% of their needs  Progressing  MONITOR:   PO intake, Supplement acceptance, Diet advancement, Labs, Weight trends, I & O's  REASON FOR ASSESSMENT:   Consult Assessment of nutrition requirement/status, Enteral/tube feeding initiation and management  ASSESSMENT:   Pt with ETOH abuse admitted post fall found down in backyard with multiple head lacerations and abrasions to BLE and BUE, C7 and T1 fxs. Found to have chronic SDHs and per CT pt with previous whipple, possible acute interstitial pancreatitis, possible enteritis, and possible ileus vs SBO.  05/04 - found down in backyard, pt with multiple head lacerations and abrasions to BLE and BUE 05/07 - extubated 05/08 - failed swallow, Cortrak placed (per x-ray in gastric body), TF started 05/09 - diet advanced to full liquids 05/10 - TF d/c per MD  Discussed pt with RN and MD. MD requested tube feeds be held this AM due to abdominal distention. An abdominal x-ray was obtained. Per x-ray, enteric catheter tip projecting  over the gastric body and bowel gas pattern unremarkable without obstruction or ileus.  Given diet has only been advanced to full liquids, RD recommends transitioning tube feeds to nocturnal. MD would like to d/c tube feeds for now, keep Cortrak in place, and monitor PO intake with a calorie count. RN/NT to informed regarding calorie count.  RD to order oral nutrition supplements to aid pt in meeting kcal and protein needs via PO route.  Admit weight: 62.1 kg Current weight: 73.6 kg  Current TF: Pivot 1.5 @ 60 ml/hr, free water 250 ml q 6 hours  Medications reviewed and include: banatrol TF 60 ml BID, folic acid, SSI q 4 hours, MVI with minerals, IV protonix, klor-con 40 mEq daily, thiamine  Labs reviewed: potassium 3.0, BUN 69, creatinine 4.29 CBG's: 87-205 x 24 hours  UOP: 2150 ml x 24 hours Rectal tube: 1000 ml + 8 unmeasured occurrences x 24 ours I/O's: +12.5 L since admit  Diet Order:   Diet Order             Diet full liquid Room service appropriate? Yes; Fluid consistency: Thin  Diet effective now                   EDUCATION NEEDS:   No education needs have been identified at this time  Skin:  Skin Assessment: Skin Integrity Issues: Incisions: closed posterior upper head  Last BM:  04/22/24 1000 ml x 24 hours via FMS  Height:   Ht Readings from Last 1 Encounters:  04/17/23 6' (1.829 m)    Weight:  Wt Readings from Last 1 Encounters:  04/23/23 71.2 kg    Ideal Body Weight:  80.9 kg  BMI:  Body mass index is 21.29 kg/m.  Estimated Nutritional Needs:   Kcal:  2000-2200  Protein:  105-135 grams  Fluid:  >2 L/day    Mertie Clause, MS, RD, LDN Inpatient Clinical Dietitian Please see AMiON for contact information.

## 2023-04-23 NOTE — Progress Notes (Signed)
Inpatient Rehab Admissions Coordinator:    CIR following for potential admit. Pt. States interest but is not sure that family can provide support at d/c as they all work. I will reach out to family to discuss.   Megan Salon, MS, CCC-SLP Rehab Admissions Coordinator  [phone] (celll) [phone] (office)

## 2023-04-23 NOTE — Care Management Important Message (Signed)
Important Message  Patient Details  Name: Knowledge Baquero MRN: [id number] Date of Birth: Jul 02, 1946   Medicare Important Message Given:  Yes     Sherilyn Banker 04/23/2023, 10:32 AM

## 2023-04-23 NOTE — Progress Notes (Addendum)
Patient's creatinine seems to minimally and slightly improved.  He does complain of thirst.  Will increase his free water flushes.  Continues to have some edema in his thighs I do not think he needs IV fluids at this time.  Repeat urinalysis to assess hematuria likely not helpful given he has required intermittent catheterization.  I have personally seen and examined this patient and agree with the assessment/plan as outlined below by Melissa Montane; I personally obtained all documented information provided and made corrections as needed.Bernestine Amass Calena [place],MD 04/23/2023 1:12 PM   Nephrology Follow-Up Consult note   Assessment/Recommendations: Chad Winters is a/an 76 y.o. male with a past medical history of ETOH use, Renal CA, and HTN who presents w/ Acute encephalopathy   Non-Oliguric AKI (stable): Likely multifactorial components from septic shock, blood loss, general low volume, acute interstitial pancreatitis, and made worse by low albumin and contrast. Overall, seems to be a pre-renal cause, possible component of ATN. Once fluid status is stabilized, I believe that so will his renal function. -Chart reviewed: (medications acceptable, RFs as above).   -Continue to monitor daily Cr, Dose meds for GFR -Monitor Daily I/Os, Daily weight  -Maintain MAP>65 for optimal renal perfusion.  -Avoid nephrotoxic medications including NSAIDs and contrast -Use synthetic opioids (Fentanyl/Dilaudid) if needed -Currently no indication for HD - Baseline Cr probably ~1.5, current nadir of 2.03 and peak of 4.50             - Cr 4.29 improving - Phos 5.5>5.2>2.7- CTM - Due to concern for possible AIN, Zosyn was switched to Ceftriaxone, appreciate aid of primary team - Urine protein/cr ratio 3.34- significant proteinuria should be followed outpt, could be due to ATN - In and Out Cathed twice this morning per nurse, resulted in some blood. Suspect that this was liekly the cause for hematuria on previous UA. -  Continue IVFs   Septic shock- BP resolved, no longer on pressors. Aim for MAP>65 as above.   Hx of Renal CA- pt was not able to produce many details, had a partial resection and no chemo.   Metabolic acidosis 2/2 lactic acidosis- resolved. Agree with further bicarb if it recurs.   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 CTM. - 3.9>3.4>3.0 - Replete with Klor-Con   Hypocalcemia- Currently uptrending though hypoalbuminemia continues to make it appear lower than it really is, last ionized calcium 0.88. Will CTM.   Volume Status: Volume status improved on exam. Based on our examination and review of available imaging, our recommendation is as above.   Recommendations conveyed to primary service.    Teryl Lucy Rothfuss [facility] 04/23/2023 9:12 AM  ___________________________________________________________  CC: Acute encephalopathy   History of Present Illness: Chad Winters is a/an 77 y.o. male with a past medical history of alcohol use and Renal CA who presented to the ED on 5/4 arriving by EMS. Pt was found unconscious on the ground outside in his backyard via a bystander. Pt presented as hypotensive, bradycardic, and hypothermic with multiple lacerations to UE, LE, and a scalp laceration. ETOH level 122. Pt ended up requiring IV fluids and two units of blood for blood loss. Pt was intubated and had an extensive trauma workup including a CT CAP w/ contrast, the rest of imaging w/o contrast. Due to hypotension pt was being stabilized on pressors (levofed). Pt was having low UOP but with the help of a lasix trial, which he is now off of. Since then pt has had  good UOP., been extubated, and is off of pressors. 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   Pt seen in hospital room. Since foley cath removal pt has gotten straight cathed twice  (>1719mL). Pt did not seem to remember that he ever had a foley catheter in but pt was falling asleep several times during exam so may have been slightly disoriented. Pt feels more dehydrated today, feels that lips are dry and that he is constantly thirsty, suspect that this is due to body's response to increase Na level (140). Pt denies N/V, CP, SOB. Pt noted no other questions.   Medications:  Current Facility-Administered Medications  Medication Dose Route Frequency Provider Last Rate Last Admin   0.9 %  sodium chloride infusion  250 mL Intravenous Continuous Pia Mau D, PA-C       cefTRIAXone (ROCEPHIN) 2 g in sodium chloride 0.9 % 100 mL IVPB  2 g Intravenous Q24H Cheri Fowler, MD   Stopped at 04/22/23 0944   Chlorhexidine Gluconate Cloth 2 % PADS 6 each  6 each Topical Daily Paliwal, Aditya, MD   6 each at 04/23/23 0859   docusate (COLACE) 50 MG/5ML liquid 100 mg  100 mg Per Tube BID PRN Pia Mau D, PA-C       enoxaparin (LOVENOX) injection 30 mg  30 mg Subcutaneous Q24H Cheri Fowler, MD   30 mg at 04/22/23 0858   feeding supplement (PIVOT 1.5 CAL) liquid 1,000 mL  1,000 mL Per Tube Continuous Cheri Fowler, MD 60 mL/hr at 04/23/23 0859 1,000 mL at 04/23/23 0859   fentaNYL (SUBLIMAZE) bolus via infusion 25-100 mcg  25-100 mcg Intravenous Q15 min PRN Lidia Collum, PA-C   100 mcg at 04/17/23 2256   fiber supplement (BANATROL TF) liquid 60 mL  60 mL Per Tube BID Cheri Fowler, MD   60 mL at 04/22/23 2134   folic acid injection 1 mg  1 mg Intravenous Daily Lidia Collum, PA-C   1 mg at 04/22/23 1634   free water 250 mL  250 mL Per Tube Q6H Cheri Fowler, MD   250 mL at 04/23/23 8119   Gerhardt's butt cream   Topical PRN Narda Bonds, MD   Given at 04/22/23 1634   hydrogen peroxide 3 % external solution   Topical BID Bedelia Person, MD   Given at 04/22/23 2134   insulin aspart (novoLOG) injection 0-9 Units  0-9 Units Subcutaneous Q4H Cheri Fowler, MD   1 Units at 04/23/23 0829    midazolam (VERSED) injection 1 mg  1 mg Intravenous Q2H PRN Paliwal, Eliezer Lofts, MD       multivitamin with minerals tablet 1 tablet  1 tablet Per Tube Daily Pia Mau D, PA-C   1 tablet at 04/22/23 0859   mupirocin ointment (BACTROBAN) 2 %   Topical BID Bedelia Person, MD   Given at 04/22/23 2134   Oral care mouth rinse  15 mL Mouth Rinse 4 times per day Cheri Fowler, MD   15 mL at 04/23/23 0830   Oral care mouth rinse  15 mL Mouth Rinse PRN Cheri Fowler, MD       pantoprazole (PROTONIX) injection 40 mg  40 mg Intravenous QHS Pia Mau D, PA-C   40 mg at 04/22/23 2134   polyethylene glycol (MIRALAX / GLYCOLAX) packet 17 g  17 g Per Tube  Daily PRN Lidia Collum, PA-C       potassium chloride (KLOR-CON) packet 40 mEq  40 mEq Per Tube Daily Narda Bonds, MD   40 mEq at 04/22/23 1212   thiamine (VITAMIN B1) injection 100 mg  100 mg Intravenous Daily Pia Mau D, PA-C   100 mg at 04/22/23 1610      Review of Systems: 10 systems reviewed and negative except per interval history/subjective  Physical Exam: Vitals:   04/23/23 0403 04/23/23 0743  BP: (!) 158/81 (!) 152/76  Pulse:  92  Resp:  (!) 23  Temp: 98.3 F (36.8 C) 97.7 F (36.5 C)  SpO2:  98%   Total I/O In: -  Out: 714 [Urine:714]  Intake/Output Summary (Last 24 hours) at 04/23/2023 0912 Last data filed at 04/23/2023 9604 Gross per 24 hour  Intake 376.44 ml  Output 3864 ml  Net -3487.56 ml   Constitutional: well-appearing, no acute distress ENMT: ears and nose without scars or lesions, MMM CV: RRR, no pretibial edema but 1-2+ pitting edema in bilateral thighs Respiratory: clear to auscultation, normal work of breathing Gastrointestinal: soft, non-tender, no palpable masses or hernias Skin: no visible lesions or rashes Psych: alert, judgement/insight appropriate, appropriate mood and affect- fell asleep several times during exam   Test Results I personally reviewed new and old clinical labs and radiology  tests Lab Results  Component Value Date   NA 140 04/23/2023   K 3.0 (L) 04/23/2023   CL 101 04/23/2023   CO2 23 04/23/2023   BUN 69 (H) 04/23/2023   CREATININE 4.29 (H) 04/23/2023   CALCIUM 8.1 (L) 04/23/2023   ALBUMIN <1.5 (L) 04/20/2023   PHOS 2.7 04/23/2023    CBC Recent Labs  Lab 04/18/23 0503 04/18/23 1035 04/19/23 0719 04/19/23 0812 04/22/23 0432  WBC 11.7*  --  10.5  --  6.8  NEUTROABS 9.8*  --  8.1*  --  4.3  HGB 6.6*   < > 9.5* 9.2* 9.2*  HCT 18.4*   < > 27.7* 27.0* 27.1*  MCV 83.6  --  84.5  --  88.0  PLT 125*  141*  --  108*  --  155   < > = values in this interval not displayed.

## 2023-04-23 NOTE — Progress Notes (Signed)
Physical Therapy Treatment Patient Details Name: Chad Winters MRN: [id number] DOB: 03-28-1946 Today's Date: 04/23/2023   History of Present Illness Patient presenting to ED on 04/17/23 after being found down in his backyard. Patient hypothermic, bradycardic, and intubated. Multiple lesions on scalp, BUEs, and BLEs. CT head/c-spine showing acute compression fractures of c7 and T1, neurosurgery recommending C collar. CT chest/abd/pelvis w/ prior whipple procedure w/ slight edema around pancreatic body and tail concerncing for acute interstitial pancreatitis; possible enteritis; and possible ileus vs. Low-grade obstruction of small bowel; low-density lesion in liver. MRI is pending. No PMH on file.    PT Comments    Pt received in bed, primary c/o feeling need to urinate. Pt required mod assist bed mobility, min assist sit to stand, and mod assist amb 10' with RW (5' forward, 5' back). Gait limited by pt urgency to urinate. Urinal obtained. Pt only able to produce 25 ml urine after several minutes. Pt assisted back to bed for bladder scan at end of session. Pt is progressing well with mobility. Current POC remains appropriate.   Recommendations for follow up therapy are one component of a multi-disciplinary discharge planning process, led by the attending physician.  Recommendations may be updated based on patient status, additional functional criteria and insurance authorization.  Follow Up Recommendations       Assistance Recommended at Discharge Frequent or constant Supervision/Assistance  Patient can return home with the following A lot of help with bathing/dressing/bathroom;Assistance with cooking/housework;Direct supervision/assist for medications management;Direct supervision/assist for financial management;Assist for transportation;Help with stairs or ramp for entrance;A lot of help with walking and/or transfers   Equipment Recommendations  Rolling walker (2 wheels)    Recommendations  for Other Services       Precautions / Restrictions Precautions Precautions: Fall;Cervical;Other (comment) Precaution Comments: coretrak, flexiseal Required Braces or Orthoses: Cervical Brace Cervical Brace: Hard collar;At all times     Mobility  Bed Mobility Overal bed mobility: Needs Assistance Bed Mobility: Supine to Sit     Supine to sit: Mod assist, HOB elevated     General bed mobility comments: assist with trunk, cues for sequencing    Transfers Overall transfer level: Needs assistance Equipment used: Rolling walker (2 wheels) Transfers: Sit to/from Stand Sit to Stand: Min assist                Ambulation/Gait Ambulation/Gait assistance: Mod assist Gait Distance (Feet): 10 Feet Assistive device: Rolling walker (2 wheels) Gait Pattern/deviations: Step-through pattern, Decreased stride length Gait velocity: decreased     General Gait Details: short, unsteady steps. Distance limited by urgency to urinate. Amb 5' forward then 5' back.   Stairs             Wheelchair Mobility    Modified Rankin (Stroke Patients Only)       Balance Overall balance assessment: Needs assistance Sitting-balance support: Feet supported, Single extremity supported Sitting balance-Leahy Scale: Fair     Standing balance support: During functional activity, Reliant on assistive device for balance, Bilateral upper extremity supported Standing balance-Leahy Scale: Poor                              Cognition Arousal/Alertness: Awake/alert Behavior During Therapy: WFL for tasks assessed/performed Overall Cognitive Status: Impaired/Different from baseline Area of Impairment: Attention, Memory, Following commands, Safety/judgement, Awareness, Problem solving                   Current Attention  Level: Selective Memory: Decreased recall of precautions, Decreased short-term memory Following Commands: Follows one step commands  consistently Safety/Judgement: Decreased awareness of safety, Decreased awareness of deficits Awareness: Emergent Problem Solving: Difficulty sequencing, Requires verbal cues          Exercises      General Comments General comments (skin integrity, edema, etc.): Pt on RA      Pertinent Vitals/Pain Pain Assessment Pain Assessment: Faces Faces Pain Scale: Hurts little more Pain Location: generalized with movement Pain Descriptors / Indicators: Discomfort, Grimacing, Guarding Pain Intervention(s): Monitored during session, Repositioned    Home Living                          Prior Function            PT Goals (current goals can now be found in the care plan section) Acute Rehab PT Goals Patient Stated Goal: regain mobility Progress towards PT goals: Progressing toward goals    Frequency    Min 4X/week      PT Plan Current plan remains appropriate    Co-evaluation              AM-PAC PT "6 Clicks" Mobility   Outcome Measure  Help needed turning from your back to your side while in a flat bed without using bedrails?: A Little Help needed moving from lying on your back to sitting on the side of a flat bed without using bedrails?: A Lot Help needed moving to and from a bed to a chair (including a wheelchair)?: A Lot Help needed standing up from a chair using your arms (e.g., wheelchair or bedside chair)?: A Little Help needed to walk in hospital room?: Total Help needed climbing 3-5 steps with a railing? : Total 6 Click Score: 12    End of Session Equipment Utilized During Treatment: Cervical collar;Gait belt Activity Tolerance: Patient tolerated treatment well Patient left: in bed;with call bell/phone within reach;with bed alarm set Nurse Communication: Mobility status PT Visit Diagnosis: Unsteadiness on feet (R26.81);Muscle weakness (generalized) (M62.81);History of falling (Z91.81)     Time: 1132-1204 PT Time Calculation (min) (ACUTE  ONLY): 32 min  Charges:  $Gait Training: 8-22 mins $Therapeutic Activity: 8-22 mins                     Ferd Glassing., PT  Office # [phone]    Ilda Foil 04/23/2023, 12:47 PM

## 2023-04-23 NOTE — Progress Notes (Signed)
   04/23/23 0817  Urine Characteristics  Urinary Incontinence Yes  Urine Color Yellow/straw  Urine Appearance Clear  Urinary Interventions Bladder scan;Intermittent/Straight cath;Post void cath residual  Bladder Scan Volume (mL) 686 mL  Intermittent/Straight Cath (mL) 700 mL  Intermittent Catheter Size 16  Post Void Cath Residual (mL) 14 mL  Hygiene Peri care    Pt assessed to have a distended bladder, bladder scanned for 686 ml. Pt was encouraged to void but unable to, pt report having the urge with some dribbling but couldn't void. Peri care done and using standing protocol and NT as second person, pt was in and out catheterized for 700 ml. During the catheter insertion, RN met some resistance before the catheter could be advance to drain the urine. Afterwards, the catheter had some small bloody discharge on the tip of the catheter. Pt appears more comfortable and started to snore in bed after the In & Out. Will continue to closely monitor pt. Dionne Bucy RN

## 2023-04-23 NOTE — Progress Notes (Signed)
PROGRESS NOTE    Chad Winters  NWG:[id number] DOB: 20-Mar-1946 DOA: 04/17/2023 PCP: Donia Ast, MD   Brief Narrative: No notes on file   Assessment and Plan:  Head trauma Head lacerations Present on admission. Evaluated by trauma surgery. 7 staples placed by EDP on 5/4.  Acute C7 and T1 fractures Neurosurgery consulted and recommended a cervical collar with further recommendations for outpatient follow-up in clinic in 4 weeks. -Continue cervical collar  Chronic subdural hematomas/hygromas Noted. No acute management  Acute blood loss anemia Secondary to trauma and subsequent blood loss. Patient with a hemoglobin down to 6.5 requiring transfusion of 2 units of PRBC. Hemoglobin stable.  Acute enteritis Patient managed on empiric antibiotics with Vancomycin, Zosyn and Cefazolin. No culture data available. Antibiotics transitioned to Linezolid and Zosyn before finally transitioning to Ceftriaxone. WBC trended down. -Continue Ceftriaxone  Septic shock Present on admission. Secondary to acute enteritis. Associated hypothermia. Patient required vasopressor support while in ICU and has weaned off successfully. Shock resolved. Blood cultures with no growth.  Acute metabolic/toxic encephalopathy Appears to be significantly improved.   Acute respiratory failure with hypoxia Patient required intubation and mechanical ventilation on admission (5/4). He was successfully extubated on 5/7. He was weaned successfully to room air.  AKI Secondary to possible AIN per nephrology. Complicated by septic shock. Creatinine peak of 4.50. Nephrology consulted and have recommended IV fluids. Creatinine down to 4.29 today.  Dysphagia NG tube placed on 5/8 and patient is currently managed on tube feeds. SLP has now recommended a full liquid diet -Continue full liquid diet -Pause tube feeds for now  Hypokalemia -Potassium supplementation  Hypocalcemia Normal once corrected for  hypoalbuminemia  Hypophosphatemia Resolved with phosphorus repletion, however now with mild hyperphosphatemia.  Possible pancreatitis Noted CT evidence of slight edema of pancreatic body and tail with mildly elevated lipase. No symptoms consistent with pancreatitis at this time.  Shock liver Secondary to septic shock. AST/ALT peak of 3,014/1,017 respectively with significant improvement.  Liver lesion Noted on CT imaging with recommendation for MRI w/wo contrast for further evaluation -Will obtain MRI abdomen w/wo contrast once kidney function is improved  Thrombocytopenia Secondary to critical illness. Platelets down to 108,000. Thrombocytopenia now resolved.  Moderate malnutrition Noted. Patient is currently on tube feeds secondary to dysphagia  DVT prophylaxis: Lovenox Code Status:   Code Status: Full Code Family Communication: None at bedside Disposition Plan: Discharge likely in 3-5 days pending improvement of AKI. Anticipate discharge to acute inpatient rehab   Consultants:  PCCM Nephrology Trauma Neurosurgery  Procedures:    Antimicrobials: Vancomycin Zosyn Cefazolin Ceftriaxone    Subjective: No concerns today. Hoping to get some nutrition in by mouth.  Objective: BP 137/80 (BP Location: Left Arm)   Pulse 77   Temp (!) 97.5 F (36.4 C) (Oral)   Resp (!) 23   Ht 6' (1.829 m)   Wt 73.6 kg   SpO2 98%   BMI 22.01 kg/m   Examination:  General exam: Appears calm and comfortable Respiratory system: Clear to auscultation. Respiratory effort normal. Cardiovascular system: S1 & S2 heard, RRR. Gastrointestinal system: Abdomen is nondistended, soft and nontender. Normal bowel sounds heard. Central nervous system: Alert and oriented. Musculoskeletal: No edema. No calf tenderness Psychiatry: Judgement and insight appear normal. Mood & affect appropriate.   Data Reviewed: I have personally reviewed following labs and imaging studies  CBC Lab Results   Component Value Date   WBC 6.8 04/22/2023   RBC 3.08 (L) 04/22/2023  HGB 9.2 (L) 04/22/2023   HCT 27.1 (L) 04/22/2023   MCV 88.0 04/22/2023   MCH 29.9 04/22/2023   PLT 155 04/22/2023   MCHC 33.9 04/22/2023   RDW 15.9 (H) 04/22/2023   LYMPHSABS 1.2 04/22/2023   MONOABS 0.9 04/22/2023   EOSABS 0.2 04/22/2023   BASOSABS 0.0 04/22/2023     Last metabolic panel Lab Results  Component Value Date   NA 140 04/23/2023   K 3.0 (L) 04/23/2023   CL 101 04/23/2023   CO2 23 04/23/2023   BUN 69 (H) 04/23/2023   CREATININE 4.29 (H) 04/23/2023   GLUCOSE 142 (H) 04/23/2023   GFRNONAA 14 (L) 04/23/2023   CALCIUM 8.1 (L) 04/23/2023   PHOS 2.7 04/23/2023   PROT 4.8 (L) 04/20/2023   ALBUMIN <1.5 (L) 04/20/2023   BILITOT 0.8 04/20/2023   ALKPHOS 100 04/20/2023   AST 418 (H) 04/20/2023   ALT 46 (H) 04/20/2023   ANIONGAP 16 (H) 04/23/2023    GFR: Estimated Creatinine Clearance: 15.5 mL/min (A) (by C-G formula based on SCr of 4.29 mg/dL (H)).  Recent Results (from the past 240 hour(s))  Culture, blood (routine x 2)     Status: None   Collection Time: 04/17/23  2:51 AM   Specimen: BLOOD RIGHT HAND  Result Value Ref Range Status   Specimen Description BLOOD RIGHT HAND  Final   Special Requests   Final    BOTTLES DRAWN AEROBIC AND ANAEROBIC Blood Culture adequate volume   Culture   Final    NO GROWTH 5 DAYS Performed at [facility], [address], [place], [place] [address]    Report Status 04/22/2023 FINAL  Final  Culture, blood (routine x 2)     Status: None   Collection Time: 04/17/23  2:56 AM   Specimen: BLOOD  Result Value Ref Range Status   Specimen Description BLOOD L FIN  Final   Special Requests   Final    BOTTLES DRAWN AEROBIC ONLY Blood Culture results may not be optimal due to an inadequate volume of blood received in culture bottles   Culture   Final    NO GROWTH 5 DAYS Performed at [facility], [address] [place], [place] [address]    Report  Status 04/22/2023 FINAL  Final  MRSA Next Gen by PCR, Nasal     Status: None   Collection Time: 04/17/23  5:24 AM   Specimen: Nasal Mucosa; Nasal Swab  Result Value Ref Range Status   MRSA by PCR Next Gen NOT DETECTED NOT DETECTED Final    Comment: (NOTE) The GeneXpert MRSA Assay (FDA approved for NASAL specimens only), is one component of a comprehensive MRSA colonization surveillance program. It is not intended to diagnose MRSA infection nor to guide or monitor treatment for MRSA infections. Test performance is not FDA approved in patients less than 19 years old. Performed at [facility], [address] [place], [place] [address]   Urine Culture (for pregnant, neutropenic or urologic patients or patients with an indwelling urinary catheter)     Status: None   Collection Time: 04/17/23  1:30 PM   Specimen: Urine, Clean Catch  Result Value Ref Range Status   Specimen Description URINE, CLEAN CATCH  Final   Special Requests NONE  Final   Culture   Final    NO GROWTH Performed at [facility], [address], [place], [place] [address]    Report Status 04/19/2023 FINAL  Final      Radiology Studies: DG Abd Portable 1V  Result Date: 04/23/2023 CLINICAL DATA:  Abdominal distension EXAM: PORTABLE ABDOMEN - 1 VIEW COMPARISON:  04/21/2023 FINDINGS: Supine frontal view of the abdomen and pelvis excludes hemidiaphragms by collimation. Enteric catheter tip projects over the gastric body. Bowel gas pattern is unremarkable without obstruction or ileus. No masses or abnormal calcifications. Bilateral hip arthroplasties. IMPRESSION: 1. Enteric catheter tip projecting over the gastric body. Electronically Signed   By: Sharlet Salina M.D.   On: 04/23/2023 11:17   DG Abd Portable 1V  Result Date: 04/21/2023 CLINICAL DATA:  Feeding tube placement EXAM: PORTABLE ABDOMEN - 1 VIEW COMPARISON:  CT 04/17/2023 FINDINGS: Semi-erect artifact degraded view of the abdomen and upper pelvis with  multiple wires and leads projecting over the chest. Feeding tube terminates at the gastric body. No gross free intraperitoneal air or bowel obstruction. Cardiomegaly. IMPRESSION: Feeding tube terminating at the gastric body. Electronically Signed   By: Jeronimo Greaves M.D.   On: 04/21/2023 14:52      LOS: 6 days    Jacquelin Hawking, MD [facility] 04/23/2023, 12:47 PM   If 7PM-7AM, please contact night-coverage [email or web address]

## 2023-04-24 DIAGNOSIS — G9340 Encephalopathy, unspecified: Secondary | ICD-10-CM | POA: Diagnosis not present

## 2023-04-24 LAB — GLUCOSE, CAPILLARY
Glucose-Capillary: 110 mg/dL — ABNORMAL HIGH (ref 70–99)
Glucose-Capillary: 114 mg/dL — ABNORMAL HIGH (ref 70–99)
Glucose-Capillary: 114 mg/dL — ABNORMAL HIGH (ref 70–99)
Glucose-Capillary: 118 mg/dL — ABNORMAL HIGH (ref 70–99)
Glucose-Capillary: 92 mg/dL (ref 70–99)
Glucose-Capillary: 96 mg/dL (ref 70–99)

## 2023-04-24 LAB — RENAL FUNCTION PANEL
Albumin: 2.1 g/dL — ABNORMAL LOW (ref 3.5–5.0)
Anion gap: 12 (ref 5–15)
BUN: 59 mg/dL — ABNORMAL HIGH (ref 8–23)
CO2: 22 mmol/L (ref 22–32)
Calcium: 8.4 mg/dL — ABNORMAL LOW (ref 8.9–10.3)
Chloride: 106 mmol/L (ref 98–111)
Creatinine, Ser: 3.82 mg/dL — ABNORMAL HIGH (ref 0.61–1.24)
GFR, Estimated: 16 mL/min — ABNORMAL LOW (ref >60)
Glucose, Bld: 87 mg/dL (ref 70–99)
Phosphorus: 3.4 mg/dL (ref 2.5–4.6)
Potassium: 3.9 mmol/L (ref 3.5–5.1)
Sodium: 140 mmol/L (ref 135–145)

## 2023-04-24 LAB — URINE CULTURE: Culture: NO GROWTH

## 2023-04-24 MED ORDER — AMLODIPINE BESYLATE 5 MG PO TABS
5.0000 mg | ORAL_TABLET | Freq: Every day | ORAL | Status: DC
  Administered 2023-04-24 – 2023-04-29 (×6): 5 mg via ORAL
  Filled 2023-04-24 (×6): qty 1

## 2023-04-24 NOTE — Progress Notes (Signed)
PROGRESS NOTE    Chad Winters  EAV:[id number] DOB: 03-07-1946 DOA: 04/17/2023 PCP: Donia Ast, MD   Brief Narrative: Chad Winters is a 77 y.o. male with a history of hypertension, gout, hyperlipidemia, ankylosing spondylitis, BPH, migraines.  Patient presented to the hospital as a level 1 trauma suffering multiple scalp lacerations with evidence of septic shock and respiratory failure requiring vasopressor and mechanical ventilation support.  Patient was admitted to the ICU on admission.  Workup was significant also for fractures of the C7 and T1 vertebra.  Neurosurgery recommendation for conservative management with cervical collar.  Patient required NG tube placement for tube feedings secondary to dysphagia.  Patient transferred out of the ICU on 5/8 and hospital service picked up on 5/9.  Speech therapy evaluated and recommended advancement of diet.  Tube feeds held.  PT/OT recommending inpatient rehab.   Assessment and Plan:  Head trauma Head lacerations Present on admission. Evaluated by trauma surgery. 7 staples placed by EDP on 5/4.  Acute C7 and T1 fractures Neurosurgery consulted and recommended a cervical collar with further recommendations for outpatient follow-up in clinic in 4 weeks. -Continue cervical collar  Chronic subdural hematomas/hygromas Noted. No acute management  Acute blood loss anemia Secondary to trauma and subsequent blood loss. Patient with a hemoglobin down to 6.5 requiring transfusion of 2 units of PRBC. Hemoglobin stable.  Acute enteritis Patient managed on empiric antibiotics with Vancomycin, Zosyn and Cefazolin. No culture data available. Antibiotics transitioned to Linezolid and Zosyn before finally transitioning to Ceftriaxone. WBC trended down. -Continue Ceftriaxone; will treat for 10 to 14-day course  Septic shock Present on admission. Secondary to acute enteritis. Associated hypothermia. Patient required vasopressor support  while in ICU and has weaned off successfully. Shock resolved. Blood cultures with no growth.  Acute metabolic/toxic encephalopathy Appears to be significantly improved.   Acute respiratory failure with hypoxia Patient required intubation and mechanical ventilation on admission (5/4). He was successfully extubated on 5/7. He was weaned successfully to room air.  AKI Secondary to possible ATN in setting of hypotension from septic shock. Complicated by septic shock. Creatinine peak of 4.50. Nephrology consulted and recommended IV fluids, now transitioned to increased free water. Creatinine down to 3.82 today. -Continued nephrology recommendations  Dysphagia NG tube placed on 5/8 and patient is currently managed on tube feeds. SLP has now recommended a full liquid diet -Continue full liquid diet -Pause tube feeds for now  Hypokalemia -Potassium supplementation  Hypocalcemia Normal once corrected for hypoalbuminemia  Hypophosphatemia Resolved with phosphorus repletion, however now with mild hyperphosphatemia.  Possible pancreatitis Noted CT evidence of slight edema of pancreatic body and tail with mildly elevated lipase. No symptoms consistent with pancreatitis at this time.  Shock liver Secondary to septic shock. AST/ALT peak of 3,014/1,017 respectively with significant improvement.  Liver lesion Noted on CT imaging with recommendation for MRI w/wo contrast for further evaluation -Will obtain MRI abdomen w/wo contrast once kidney function is improved  Thrombocytopenia Secondary to critical illness. Platelets down to 108,000. Thrombocytopenia now resolved.  Moderate malnutrition Noted. Patient is currently on tube feeds secondary to dysphagia  DVT prophylaxis: Lovenox Code Status:   Code Status: Full Code Family Communication: None at bedside Disposition Plan: Discharge likely in 3-4 days pending improvement of AKI and hopeful removal of NG feeding tube. Anticipate discharge  to acute inpatient rehab   Consultants:  PCCM Nephrology Trauma Neurosurgery  Procedures:  5/4-5/7: Intubation and mechanical ventilation 5/8: Core track tube placement  Antimicrobials: Vancomycin Zosyn  Cefazolin Ceftriaxone    Subjective: Patient without any specific concerns today.  Patient states that he did well with oral intake yesterday.  Objective: BP (!) 145/76   Pulse 83   Temp 97.8 F (36.6 C) (Oral)   Resp 20   Ht 6' (1.829 m)   Wt 71.2 kg   SpO2 100%   BMI 21.29 kg/m   Examination:  General exam: Appears calm and comfortable Respiratory system: Clear to auscultation. Respiratory effort normal. Cardiovascular system: S1 & S2 heard, RRR Gastrointestinal system: Abdomen is nondistended, soft and nontender. Normal bowel sounds heard. Central nervous system: Alert and oriented. No focal neurological deficits. Psychiatry: Judgement and insight appear normal. Mood & affect appropriate.    Data Reviewed: I have personally reviewed following labs and imaging studies  CBC Lab Results  Component Value Date   WBC 6.8 04/22/2023   RBC 3.08 (L) 04/22/2023   HGB 9.2 (L) 04/22/2023   HCT 27.1 (L) 04/22/2023   MCV 88.0 04/22/2023   MCH 29.9 04/22/2023   PLT 155 04/22/2023   MCHC 33.9 04/22/2023   RDW 15.9 (H) 04/22/2023   LYMPHSABS 1.2 04/22/2023   MONOABS 0.9 04/22/2023   EOSABS 0.2 04/22/2023   BASOSABS 0.0 04/22/2023     Last metabolic panel Lab Results  Component Value Date   NA 140 04/24/2023   K 3.9 04/24/2023   CL 106 04/24/2023   CO2 22 04/24/2023   BUN 59 (H) 04/24/2023   CREATININE 3.82 (H) 04/24/2023   GLUCOSE 87 04/24/2023   GFRNONAA 16 (L) 04/24/2023   CALCIUM 8.4 (L) 04/24/2023   PHOS 3.4 04/24/2023   PROT 4.8 (L) 04/20/2023   ALBUMIN 2.1 (L) 04/24/2023   BILITOT 0.8 04/20/2023   ALKPHOS 100 04/20/2023   AST 418 (H) 04/20/2023   ALT 46 (H) 04/20/2023   ANIONGAP 12 04/24/2023    GFR: Estimated Creatinine Clearance: 16.8  mL/min (A) (by C-G formula based on SCr of 3.82 mg/dL (H)).  Recent Results (from the past 240 hour(s))  Culture, blood (routine x 2)     Status: None   Collection Time: 04/17/23  2:51 AM   Specimen: BLOOD RIGHT HAND  Result Value Ref Range Status   Specimen Description BLOOD RIGHT HAND  Final   Special Requests   Final    BOTTLES DRAWN AEROBIC AND ANAEROBIC Blood Culture adequate volume   Culture   Final    NO GROWTH 5 DAYS Performed at [facility], [address], [place], [place] [address]    Report Status 04/22/2023 FINAL  Final  Culture, blood (routine x 2)     Status: None   Collection Time: 04/17/23  2:56 AM   Specimen: BLOOD  Result Value Ref Range Status   Specimen Description BLOOD L FIN  Final   Special Requests   Final    BOTTLES DRAWN AEROBIC ONLY Blood Culture results may not be optimal due to an inadequate volume of blood received in culture bottles   Culture   Final    NO GROWTH 5 DAYS Performed at [facility], [address] [place], [place] [address]    Report Status 04/22/2023 FINAL  Final  MRSA Next Gen by PCR, Nasal     Status: None   Collection Time: 04/17/23  5:24 AM   Specimen: Nasal Mucosa; Nasal Swab  Result Value Ref Range Status   MRSA by PCR Next Gen NOT DETECTED NOT DETECTED Final    Comment: (NOTE) The  GeneXpert MRSA Assay (FDA approved for NASAL specimens only), is one component of a comprehensive MRSA colonization surveillance program. It is not intended to diagnose MRSA infection nor to guide or monitor treatment for MRSA infections. Test performance is not FDA approved in patients less than 69 years old. Performed at [facility], [address], [place], [place] [address]   Urine Culture (for pregnant, neutropenic or urologic patients or patients with an indwelling urinary catheter)     Status: None   Collection Time: 04/17/23  1:30 PM   Specimen: Urine, Clean Catch  Result Value Ref Range Status   Specimen  Description URINE, CLEAN CATCH  Final   Special Requests NONE  Final   Culture   Final    NO GROWTH Performed at [facility], [address] [place], [place] [address]    Report Status 04/19/2023 FINAL  Final  Urine Culture     Status: None   Collection Time: 04/23/23  1:30 PM   Specimen: Urine, Catheterized  Result Value Ref Range Status   Specimen Description URINE, CATHETERIZED  Final   Special Requests NONE  Final   Culture   Final    NO GROWTH Performed at [facility], [address] [place], [place] [address]    Report Status 04/24/2023 FINAL  Final      Radiology Studies: DG Abd Portable 1V  Result Date: 04/23/2023 CLINICAL DATA:  Abdominal distension EXAM: PORTABLE ABDOMEN - 1 VIEW COMPARISON:  04/21/2023 FINDINGS: Supine frontal view of the abdomen and pelvis excludes hemidiaphragms by collimation. Enteric catheter tip projects over the gastric body. Bowel gas pattern is unremarkable without obstruction or ileus. No masses or abnormal calcifications. Bilateral hip arthroplasties. IMPRESSION: 1. Enteric catheter tip projecting over the gastric body. Electronically Signed   By: Sharlet Salina M.D.   On: 04/23/2023 11:17      LOS: 7 days    Jacquelin Hawking, MD [facility] 04/24/2023, 1:02 PM   If 7PM-7AM, please contact night-coverage [email or web address]

## 2023-04-24 NOTE — Progress Notes (Signed)
Physical Therapy Treatment Patient Details Name: Chad Winters MRN: [id number] DOB: 07/31/1946 Today's Date: 04/24/2023   History of Present Illness Patient presenting to ED on 04/17/23 after being found down in his backyard. Patient hypothermic, bradycardic, and intubated. Multiple lesions on scalp, BUEs, and BLEs. CT head/c-spine showing acute compression fractures of c7 and T1, neurosurgery recommending C collar. CT chest/abd/pelvis w/ prior whipple procedure w/ slight edema around pancreatic body and tail concerncing for acute interstitial pancreatitis; possible enteritis; and possible ileus vs. Low-grade obstruction of small bowel; low-density lesion in liver. MRI is pending. No PMH on file.    PT Comments    The pt was agreeable to session, eager to get OOB. He was able to demo great progress with OOB mobility, and activity tolerance. He continues to demo deficits in dynamic stability, awareness, and safety awareness, often needing max verbal cues and assist to correct errors. Continue to recommend intensive therapies to facilitate full return to independence.     Recommendations for follow up therapy are one component of a multi-disciplinary discharge planning process, led by the attending physician.  Recommendations may be updated based on patient status, additional functional criteria and insurance authorization.  Follow Up Recommendations       Assistance Recommended at Discharge Frequent or constant Supervision/Assistance  Patient can return home with the following A lot of help with bathing/dressing/bathroom;Assistance with cooking/housework;Direct supervision/assist for medications management;Direct supervision/assist for financial management;Assist for transportation;Help with stairs or ramp for entrance;A lot of help with walking and/or transfers   Equipment Recommendations  Rolling walker (2 wheels)    Recommendations for Other Services Rehab consult     Precautions /  Restrictions Precautions Precautions: Fall;Cervical;Other (comment) Precaution Comments: coretrak, flexiseal Required Braces or Orthoses: Cervical Brace Cervical Brace: Hard collar;At all times Restrictions Weight Bearing Restrictions: No     Mobility  Bed Mobility Overal bed mobility: Needs Assistance Bed Mobility: Rolling, Sidelying to Sit Rolling: Min guard Sidelying to sit: Min assist       General bed mobility comments: minA to elevate trunk, pt able to complete movement of LE without assist    Transfers Overall transfer level: Needs assistance Equipment used: Rolling walker (2 wheels) Transfers: Sit to/from Stand Sit to Stand: Min assist           General transfer comment: minA to rise with cues for hand placement    Ambulation/Gait Ambulation/Gait assistance: Min assist, Mod assist Gait Distance (Feet): 100 Feet Assistive device: Rolling walker (2 wheels) Gait Pattern/deviations: Step-through pattern, Decreased stride length, Narrow base of support, Drifts right/left Gait velocity: too quick for safety at times     General Gait Details: at times too quick for safety, drifts R and ran into 2 objects on R side. pt reports due to poor attention.     Balance Overall balance assessment: Needs assistance Sitting-balance support: Feet supported, Single extremity supported Sitting balance-Leahy Scale: Fair     Standing balance support: During functional activity, Reliant on assistive device for balance, Bilateral upper extremity supported Standing balance-Leahy Scale: Poor Standing balance comment: reliant on external assist                            Cognition Arousal/Alertness: Awake/alert Behavior During Therapy: WFL for tasks assessed/performed Overall Cognitive Status: Impaired/Different from baseline Area of Impairment: Attention, Memory, Following commands, Safety/judgement, Awareness, Problem solving  Current  Attention Level: Selective Memory: Decreased recall of precautions, Decreased short-term memory Following Commands: Follows one step commands consistently Safety/Judgement: Decreased awareness of safety, Decreased awareness of deficits Awareness: Emergent Problem Solving: Difficulty sequencing, Requires verbal cues General Comments: pt is HOH, improved memory of prior PT sessions, but is slightly impulsive now. following commands briskly, at times with poor safety awareness        Exercises      General Comments General comments (skin integrity, edema, etc.): VSS on RA      Pertinent Vitals/Pain Pain Assessment Pain Assessment: No/denies pain Pain Intervention(s): Monitored during session     PT Goals (current goals can now be found in the care plan section) Acute Rehab PT Goals Patient Stated Goal: regain mobility PT Goal Formulation: With patient Time For Goal Achievement: 05/05/23 Potential to Achieve Goals: Good Progress towards PT goals: Progressing toward goals    Frequency    Min 4X/week      PT Plan Current plan remains appropriate       AM-PAC PT "6 Clicks" Mobility   Outcome Measure  Help needed turning from your back to your side while in a flat bed without using bedrails?: A Little Help needed moving from lying on your back to sitting on the side of a flat bed without using bedrails?: A Little Help needed moving to and from a bed to a chair (including a wheelchair)?: A Little Help needed standing up from a chair using your arms (e.g., wheelchair or bedside chair)?: A Little Help needed to walk in hospital room?: A Little Help needed climbing 3-5 steps with a railing? : A Lot 6 Click Score: 17    End of Session Equipment Utilized During Treatment: Cervical collar;Gait belt Activity Tolerance: Patient tolerated treatment well Patient left: with call bell/phone within reach;in chair;with chair alarm set Nurse Communication: Mobility status PT Visit  Diagnosis: Unsteadiness on feet (R26.81);Muscle weakness (generalized) (M62.81);History of falling (Z91.81)     Time: 3244-0102 PT Time Calculation (min) (ACUTE ONLY): 24 min  Charges:  $Gait Training: 8-22 mins $Therapeutic Exercise: 8-22 mins                     Vickki Muff, PT, DPT   Acute Rehabilitation Department Office [phone] Secure Chat Communication Preferred   Ronnie Derby 04/24/2023, 5:23 PM

## 2023-04-24 NOTE — Hospital Course (Addendum)
Chad Winters is a 77 y.o. male with a history of hypertension, gout, hyperlipidemia, ankylosing spondylitis, BPH, migraines.  Patient presented to the hospital as a level 1 trauma suffering multiple scalp lacerations with evidence of septic shock and respiratory failure requiring vasopressor and mechanical ventilation support.  Patient was admitted to the ICU on admission.  Workup was significant also for fractures of the C7 and T1 vertebra.  Neurosurgery recommendation for conservative management with cervical collar.  Patient required NG tube placement for tube feedings secondary to dysphagia.  Patient transferred out of the ICU on 5/8 and hospital service picked up on 5/9.  Speech therapy evaluated and recommended advancement of diet.  Tube feeds held.  PT/OT recommending inpatient rehab, however recommendation now is for SNF.

## 2023-04-24 NOTE — Progress Notes (Addendum)
Nephrology Follow-Up Consult note   Assessment/Recommendations: Chad Winters is a/an 77 y.o. male with a past medical history of ETOH use, Renal CA status post partial nephrectomy, and HTN who presents w/ Acute encephalopathy   Non-Oliguric AKI (stable): Baseline creatinine around 1.3-1.7.  Multiple possible etiologies but most likely ATN related to hypotension.  AIN was possible and agree with changing antibiotics.  Seems to be improving at this time -Continue with oral hydration -Continue to monitor daily Cr, Dose meds for GFR -Patient had elevated UPC.  Would evaluate outpatient once AKI resolves -Monitor Daily I/Os, Daily weight  -Maintain MAP>65 for optimal renal perfusion.  -Avoid nephrotoxic medications including NSAIDs and contrast -Use synthetic opioids (Fentanyl/Dilaudid) if needed -Currently no indication for HD  Hematuria: Likely Foley associated.  Consider repeat urinalysis in the future to ensure its resolved    Septic shock- BP resolved, no longer on pressors. Aim for MAP>65 as above.    Hypertension: Start amlodipine today.  Spinal fractures: Management per primary team  Chronic subdural hematoma  Blood loss anemia: Transfusions per primary team   Recommendations conveyed to primary service.    Darnell Level [facility] 04/24/2023 10:22 AM  ___________________________________________________________  CC: Acute encephalopathy   Interval history/subjective: No acute events.  Patient feeling well today.  Urine output continues to be very good at 2.4 L made.  Creatinine down to 3.8.   Medications:  Current Facility-Administered Medications  Medication Dose Route Frequency Provider Last Rate Last Admin   0.9 %  sodium chloride infusion  250 mL Intravenous Continuous Pia Mau D, PA-C       amLODipine (NORVASC) tablet 5 mg  5 mg Oral Daily Darnell Level, MD   5 mg at 04/24/23 4098   Chlorhexidine Gluconate Cloth 2 % PADS 6  each  6 each Topical Daily Paliwal, Aditya, MD   6 each at 04/24/23 0824   docusate (COLACE) 50 MG/5ML liquid 100 mg  100 mg Per Tube BID PRN Lidia Collum, PA-C       enoxaparin (LOVENOX) injection 30 mg  30 mg Subcutaneous Q24H Cheri Fowler, MD   30 mg at 04/24/23 1191   feeding supplement (ENSURE ENLIVE / ENSURE PLUS) liquid 237 mL  237 mL Oral TID BM Narda Bonds, MD   237 mL at 04/23/23 2009   fentaNYL (SUBLIMAZE) bolus via infusion 25-100 mcg  25-100 mcg Intravenous Q15 min PRN Pia Mau D, PA-C   100 mcg at 04/17/23 2256   fiber supplement (BANATROL TF) liquid 60 mL  60 mL Per Tube BID Cheri Fowler, MD   60 mL at 04/24/23 4782   folic acid (FOLVITE) tablet 1 mg  1 mg Per Tube Daily Narda Bonds, MD   1 mg at 04/24/23 9562   free water 250 mL  250 mL Per Tube Q6H Cheri Fowler, MD   250 mL at 04/24/23 1308   Gerhardt's butt cream   Topical PRN Narda Bonds, MD   Given at 04/22/23 1634   hydrogen peroxide 3 % external solution   Topical BID Bedelia Person, MD   1 Application at 04/23/23 2136   insulin aspart (novoLOG) injection 0-9 Units  0-9 Units Subcutaneous Q4H Cheri Fowler, MD   1 Units at 04/23/23 2010   midazolam (VERSED) injection 1 mg  1 mg Intravenous Q2H PRN Paliwal, Aditya, MD       multivitamin with minerals tablet 1 tablet  1 tablet Per Tube  Daily Lidia Collum, PA-C   1 tablet at 04/24/23 4098   mupirocin ointment (BACTROBAN) 2 %   Topical BID Bedelia Person, MD   Given at 04/24/23 [phone]   Oral care mouth rinse  15 mL Mouth Rinse 4 times per day Cheri Fowler, MD   15 mL at 04/23/23 2136   Oral care mouth rinse  15 mL Mouth Rinse PRN Cheri Fowler, MD       pantoprazole (PROTONIX) injection 40 mg  40 mg Intravenous QHS Pia Mau D, PA-C   40 mg at 04/23/23 2136   polyethylene glycol (MIRALAX / GLYCOLAX) packet 17 g  17 g Per Tube Daily PRN Lidia Collum, PA-C       thiamine (VITAMIN B1) tablet 100 mg  100 mg Per Tube Daily Narda Bonds, MD   100 mg at  04/24/23 4782      Review of Systems: 10 systems reviewed and negative except per interval history/subjective  Physical Exam: Vitals:   04/23/23 2350 04/24/23 0341  BP: (!) 152/83 (!) 145/76  Pulse:    Resp:    Temp: 98.3 F (36.8 C) 97.8 F (36.6 C)  SpO2:  100%   No intake/output data recorded.  Intake/Output Summary (Last 24 hours) at 04/24/2023 1022 Last data filed at 04/24/2023 0125 Gross per 24 hour  Intake 3632 ml  Output 1700 ml  Net 1932 ml   Constitutional: Lying in bed, no acute distress ENMT: Abrasions and injuries to the face stable, moist mucous membranes CV: Normal rate, 1+ edema at the thighs bilaterally Respiratory: Bilateral chest rise, normal work of breathing Gastrointestinal: soft, non-tender, no palpable masses or hernias Skin: no visible lesions or rashes Psych: Awake and alert   Test Results I personally reviewed new and old clinical labs and radiology tests Lab Results  Component Value Date   NA 140 04/24/2023   K 3.9 04/24/2023   CL 106 04/24/2023   CO2 22 04/24/2023   BUN 59 (H) 04/24/2023   CREATININE 3.82 (H) 04/24/2023   CALCIUM 8.4 (L) 04/24/2023   ALBUMIN 2.1 (L) 04/24/2023   PHOS 3.4 04/24/2023    CBC Recent Labs  Lab 04/18/23 0503 04/18/23 1035 04/19/23 0719 04/19/23 0812 04/22/23 0432  WBC 11.7*  --  10.5  --  6.8  NEUTROABS 9.8*  --  8.1*  --  4.3  HGB 6.6*   < > 9.5* 9.2* 9.2*  HCT 18.4*   < > 27.7* 27.0* 27.1*  MCV 83.6  --  84.5  --  88.0  PLT 125*  141*  --  108*  --  155   < > = values in this interval not displayed.

## 2023-04-25 DIAGNOSIS — G9340 Encephalopathy, unspecified: Secondary | ICD-10-CM | POA: Diagnosis not present

## 2023-04-25 LAB — GLUCOSE, CAPILLARY
Glucose-Capillary: 96 mg/dL (ref 70–99)
Glucose-Capillary: 135 mg/dL — ABNORMAL HIGH (ref 70–99)
Glucose-Capillary: 113 mg/dL — ABNORMAL HIGH (ref 70–99)
Glucose-Capillary: 118 mg/dL — ABNORMAL HIGH (ref 70–99)
Glucose-Capillary: 131 mg/dL — ABNORMAL HIGH (ref 70–99)
Glucose-Capillary: 146 mg/dL — ABNORMAL HIGH (ref 70–99)

## 2023-04-25 LAB — RENAL FUNCTION PANEL
Albumin: 2 g/dL — ABNORMAL LOW (ref 3.5–5.0)
Anion gap: 11 (ref 5–15)
BUN: 55 mg/dL — ABNORMAL HIGH (ref 8–23)
CO2: 21 mmol/L — ABNORMAL LOW (ref 22–32)
Calcium: 8.3 mg/dL — ABNORMAL LOW (ref 8.9–10.3)
Chloride: 105 mmol/L (ref 98–111)
Creatinine, Ser: 3.51 mg/dL — ABNORMAL HIGH (ref 0.61–1.24)
GFR, Estimated: 17 mL/min — ABNORMAL LOW (ref >60)
Glucose, Bld: 97 mg/dL (ref 70–99)
Phosphorus: 4 mg/dL (ref 2.5–4.6)
Potassium: 3.7 mmol/L (ref 3.5–5.1)
Sodium: 137 mmol/L (ref 135–145)

## 2023-04-25 MED ORDER — LOPERAMIDE HCL 2 MG PO CAPS
2.0000 mg | ORAL_CAPSULE | ORAL | Status: DC | PRN
  Administered 2023-04-26 (×2): 2 mg via ORAL
  Filled 2023-04-25 (×2): qty 1

## 2023-04-25 MED ORDER — PANTOPRAZOLE SODIUM 40 MG PO TBEC
40.0000 mg | DELAYED_RELEASE_TABLET | Freq: Every day | ORAL | Status: DC
  Administered 2023-04-25 – 2023-04-28 (×4): 40 mg via ORAL
  Filled 2023-04-25 (×4): qty 1

## 2023-04-25 NOTE — Progress Notes (Signed)
PHARMACIST - PHYSICIAN COMMUNICATION  DR:   Caleb Popp  CONCERNING: IV to Oral Route Change Policy  RECOMMENDATION: This patient is receiving pantoprazole by the intravenous route.  Based on criteria approved by the Pharmacy and Therapeutics Committee, the intravenous medication(s) is/are being converted to the equivalent oral dose form(s).   DESCRIPTION: These criteria include: The patient is eating (either orally or via tube) and/or has been taking other orally administered medications for a least 24 hours The patient has no evidence of active gastrointestinal bleeding or impaired GI absorption (gastrectomy, short bowel, patient on TNA or NPO).  If you have questions about this conversion, please contact the Pharmacy Department  []   [phone] )  Jeani Hawking []   [phone] )  [facility] [x]   [phone] )  [facility] []   [phone] )  [facility] []   [phone] )  [facility]   Rexford Maus, PharmD, BCPS 04/25/2023 1:34 PM

## 2023-04-25 NOTE — Progress Notes (Signed)
Nephrology Follow-Up Consult note   Assessment/Recommendations: Chad Winters is a/an 77 y.o. male with a past medical history of ETOH use, Renal CA status post partial nephrectomy, and HTN who presents w/ Acute encephalopathy   Non-Oliguric AKI (stable): Baseline creatinine around 1.3-1.7.  Multiple possible etiologies but most likely ATN related to hypotension.  AIN was possible and agree with changing antibiotics.  Continues to improve significantly -Continue with oral hydration -Continue to monitor daily Cr, Dose meds for GFR -Patient had elevated UPC.  Would evaluate outpatient once AKI resolves -Monitor Daily I/Os, Daily weight  -Maintain MAP>65 for optimal renal perfusion.  -Avoid nephrotoxic medications including NSAIDs and contrast -Use synthetic opioids (Fentanyl/Dilaudid) if needed -Currently no indication for HD  Given the patient's improvement in kidney function and good urine output we will sign off at this time.  Please do not hesitate to contact us if further help is needed.  Hematuria: Likely Foley associated.  Consider repeat urinalysis in the future to ensure its resolved    Septic shock-hypotension resolved, no longer on pressors. Aim for MAP>65 as above.    Hypertension: Continue amlodipine  Spinal fractures: Management per primary team  Chronic subdural hematoma  Blood loss anemia: Transfusions per primary team   Recommendations conveyed to primary service.    Darnell Level [facility] 04/25/2023 9:23 AM  ___________________________________________________________  CC: Acute encephalopathy   Interval history/subjective: Patient states he continues to feel well.  Creatinine continues to improve down to 3.5.  Urine output continues to be robust at 3.5 L made.   Medications:  Current Facility-Administered Medications  Medication Dose Route Frequency Provider Last Rate Last Admin   0.9 %  sodium chloride infusion  250 mL  Intravenous Continuous Pia Mau D, PA-C       amLODipine (NORVASC) tablet 5 mg  5 mg Oral Daily Darnell Level, MD   5 mg at 04/24/23 8119   Chlorhexidine Gluconate Cloth 2 % PADS 6 each  6 each Topical Daily Paliwal, Aditya, MD   6 each at 04/24/23 0824   docusate (COLACE) 50 MG/5ML liquid 100 mg  100 mg Per Tube BID PRN Pia Mau D, PA-C       enoxaparin (LOVENOX) injection 30 mg  30 mg Subcutaneous Q24H Cheri Fowler, MD   30 mg at 04/24/23 0823   feeding supplement (ENSURE ENLIVE / ENSURE PLUS) liquid 237 mL  237 mL Oral TID BM Narda Bonds, MD   237 mL at 04/24/23 2008   fiber supplement (BANATROL TF) liquid 60 mL  60 mL Per Tube BID Cheri Fowler, MD   60 mL at 04/24/23 2149   folic acid (FOLVITE) tablet 1 mg  1 mg Per Tube Daily Narda Bonds, MD   1 mg at 04/24/23 1478   free water 250 mL  250 mL Per Tube Q6H Cheri Fowler, MD   250 mL at 04/24/23 2956   Gerhardt's butt cream   Topical PRN Narda Bonds, MD   Given at 04/22/23 1634   hydrogen peroxide 3 % external solution   Topical BID Bedelia Person, MD   1 Application at 04/24/23 2147   insulin aspart (novoLOG) injection 0-9 Units  0-9 Units Subcutaneous Q4H Cheri Fowler, MD   1 Units at 04/23/23 2010   multivitamin with minerals tablet 1 tablet  1 tablet Per Tube Daily Pia Mau D, PA-C   1 tablet at 04/24/23 2130   mupirocin ointment (BACTROBAN) 2 %  Topical BID Bedelia Person, MD   1 Application at 04/24/23 2144   Oral care mouth rinse  15 mL Mouth Rinse 4 times per day Cheri Fowler, MD   15 mL at 04/24/23 2144   Oral care mouth rinse  15 mL Mouth Rinse PRN Cheri Fowler, MD       pantoprazole (PROTONIX) injection 40 mg  40 mg Intravenous QHS Pia Mau D, PA-C   40 mg at 04/24/23 2144   polyethylene glycol (MIRALAX / GLYCOLAX) packet 17 g  17 g Per Tube Daily PRN Lidia Collum, PA-C       thiamine (VITAMIN B1) tablet 100 mg  100 mg Per Tube Daily Narda Bonds, MD   100 mg at 04/24/23 1610       Review of Systems: 10 systems reviewed and negative except per interval history/subjective  Physical Exam: Vitals:   04/25/23 0300 04/25/23 0728  BP: (!) 146/89 (!) 156/101  Pulse:    Resp: 20 (!) 24  Temp: (!) 97.5 F (36.4 C) (!) 97.5 F (36.4 C)  SpO2:  97%   Total I/O In: -  Out: 850 [Urine:850]  Intake/Output Summary (Last 24 hours) at 04/25/2023 9604 Last data filed at 04/25/2023 0800 Gross per 24 hour  Intake --  Output 4350 ml  Net -4350 ml   Constitutional: Lying in bed, no acute distress ENMT: Abrasions and injuries to the face stable, moist mucous membranes CV: Normal rate, no edema in the ankles Respiratory: Bilateral chest rise, normal work of breathing Gastrointestinal: soft, non-tender, no palpable masses or hernias Skin: no visible lesions or rashes Psych: Awake and alert   Test Results I personally reviewed new and old clinical labs and radiology tests Lab Results  Component Value Date   NA 137 04/25/2023   K 3.7 04/25/2023   CL 105 04/25/2023   CO2 21 (L) 04/25/2023   BUN 55 (H) 04/25/2023   CREATININE 3.51 (H) 04/25/2023   CALCIUM 8.3 (L) 04/25/2023   ALBUMIN 2.0 (L) 04/25/2023   PHOS 4.0 04/25/2023    CBC Recent Labs  Lab 04/19/23 0719 04/19/23 0812 04/22/23 0432  WBC 10.5  --  6.8  NEUTROABS 8.1*  --  4.3  HGB 9.5* 9.2* 9.2*  HCT 27.7* 27.0* 27.1*  MCV 84.5  --  88.0  PLT 108*  --  155

## 2023-04-25 NOTE — Progress Notes (Signed)
PROGRESS NOTE    Chad Winters  LKG:[id number] DOB: [birth year] DOA: 04/17/2023 PCP: Donia Ast, MD   Brief Narrative: Chad Winters is a 77 y.o. male with a history of hypertension, gout, hyperlipidemia, ankylosing spondylitis, BPH, migraines.  Patient presented to the hospital as a level 1 trauma suffering multiple scalp lacerations with evidence of septic shock and respiratory failure requiring vasopressor and mechanical ventilation support.  Patient was admitted to the ICU on admission.  Workup was significant also for fractures of the C7 and T1 vertebra.  Neurosurgery recommendation for conservative management with cervical collar.  Patient required NG tube placement for tube feedings secondary to dysphagia.  Patient transferred out of the ICU on 5/8 and hospital service picked up on 5/9.  Speech therapy evaluated and recommended advancement of diet.  Tube feeds held.  PT/OT recommending inpatient rehab.   Assessment and Plan:  Head trauma Head lacerations Present on admission. Evaluated by trauma surgery. 7 staples placed by EDP on 5/4. Usual time to removal is 7-10 days. -Remove staples on 7/14  Acute C7 and T1 fractures Neurosurgery consulted and recommended a cervical collar with further recommendations for outpatient follow-up in clinic in 4 weeks. -Continue cervical collar  Chronic subdural hematomas/hygromas Noted. No acute management  Acute blood loss anemia Secondary to trauma and subsequent blood loss. Patient with a hemoglobin down to 6.5 requiring transfusion of 2 units of PRBC. Hemoglobin stable.  Acute enteritis Patient managed on empiric antibiotics with Vancomycin, Zosyn and Cefazolin. No culture data available. Antibiotics transitioned to Linezolid and Zosyn before finally transitioning to Ceftriaxone. WBC trended down. -Continue Ceftriaxone; will treat for 10 to 14-day course  Septic shock Present on admission. Secondary to acute enteritis.  Associated hypothermia. Patient required vasopressor support while in ICU and has weaned off successfully. Shock resolved. Blood cultures with no growth.  Acute metabolic/toxic encephalopathy Appears to be resolved.   Acute respiratory failure with hypoxia Patient required intubation and mechanical ventilation on admission (5/4). He was successfully extubated on 5/7. He was weaned successfully to room air.  AKI Secondary to possible ATN in setting of hypotension from septic shock. Complicated by septic shock. Creatinine peak of 4.50. Nephrology consulted and recommended IV fluids, now transitioned to increased free water. Creatinine down to 3.51 today. -Continued nephrology recommendations  Dysphagia NG tube placed on 5/8 and patient is currently managed on tube feeds. SLP has now recommended a full liquid diet -Continue full liquid diet; await SLP updated recommendations as provided -Pause tube feeds for now  Hypokalemia -Potassium supplementation  Hypocalcemia Normal once corrected for hypoalbuminemia  Abdominal distension Stable. No evidence of ileus or obstruction. -Monitor  Hypophosphatemia Resolved with phosphorus repletion, however now with mild hyperphosphatemia.  Possible pancreatitis Noted CT evidence of slight edema of pancreatic body and tail with mildly elevated lipase. No symptoms consistent with pancreatitis at this time.  Shock liver Secondary to septic shock. AST/ALT peak of 3,014/1,017 respectively with significant improvement.  Liver lesion Noted on CT imaging with recommendation for MRI w/wo contrast for further evaluation -Will obtain MRI abdomen w/wo contrast once kidney function is improved  Thrombocytopenia Secondary to critical illness. Platelets down to 108,000. Thrombocytopenia now resolved.  Moderate malnutrition Noted. Patient is currently on tube feeds secondary to dysphagia  DVT prophylaxis: Lovenox Code Status:   Code Status: Full  Code Family Communication: None at bedside Disposition Plan: Discharge likely in 3-4 days pending improvement of AKI and hopeful removal of NG feeding tube. Anticipate discharge to acute  inpatient rehab   Consultants:  PCCM Nephrology Trauma Neurosurgery  Procedures:  5/4-5/7: Intubation and mechanical ventilation 5/8: Core track tube placement  Antimicrobials: Vancomycin Zosyn Cefazolin Ceftriaxone    Subjective: No issues this morning. No concerns.  Objective: BP (!) 156/101 (BP Location: Left Arm)   Pulse 75   Temp (!) 97.5 F (36.4 C) (Oral)   Resp (!) 24   Ht 6' (1.829 m)   Wt 71.2 kg   SpO2 97%   BMI 21.29 kg/m   Examination:  General exam: Appears calm and comfortable  Respiratory system: Clear to auscultation. Respiratory effort normal. Cardiovascular system: S1 & S2 heard, RRR. Gastrointestinal system: Abdomen is distended, soft and nontender. Slightly decreased bowel sounds heard. Central nervous system: Alert and oriented. No focal neurological deficits. Psychiatry: Judgement and insight appear normal. Mood & affect appropriate.   Data Reviewed: I have personally reviewed following labs and imaging studies  CBC Lab Results  Component Value Date   WBC 6.8 04/22/2023   RBC 3.08 (L) 04/22/2023   HGB 9.2 (L) 04/22/2023   HCT 27.1 (L) 04/22/2023   MCV 88.0 04/22/2023   MCH 29.9 04/22/2023   PLT 155 04/22/2023   MCHC 33.9 04/22/2023   RDW 15.9 (H) 04/22/2023   LYMPHSABS 1.2 04/22/2023   MONOABS 0.9 04/22/2023   EOSABS 0.2 04/22/2023   BASOSABS 0.0 04/22/2023     Last metabolic panel Lab Results  Component Value Date   NA 137 04/25/2023   K 3.7 04/25/2023   CL 105 04/25/2023   CO2 21 (L) 04/25/2023   BUN 55 (H) 04/25/2023   CREATININE 3.51 (H) 04/25/2023   GLUCOSE 97 04/25/2023   GFRNONAA 17 (L) 04/25/2023   CALCIUM 8.3 (L) 04/25/2023   PHOS 4.0 04/25/2023   PROT 4.8 (L) 04/20/2023   ALBUMIN 2.0 (L) 04/25/2023   BILITOT 0.8  04/20/2023   ALKPHOS 100 04/20/2023   AST 418 (H) 04/20/2023   ALT 46 (H) 04/20/2023   ANIONGAP 11 04/25/2023    GFR: Estimated Creatinine Clearance: 18.3 mL/min (A) (by C-G formula based on SCr of 3.51 mg/dL (H)).  Recent Results (from the past 240 hour(s))  Culture, blood (routine x 2)     Status: None   Collection Time: 04/17/23  2:51 AM   Specimen: BLOOD RIGHT HAND  Result Value Ref Range Status   Specimen Description BLOOD RIGHT HAND  Final   Special Requests   Final    BOTTLES DRAWN AEROBIC AND ANAEROBIC Blood Culture adequate volume   Culture   Final    NO GROWTH 5 DAYS Performed at [facility], [address] [place], [place] [address]    Report Status 04/22/2023 FINAL  Final  Culture, blood (routine x 2)     Status: None   Collection Time: 04/17/23  2:56 AM   Specimen: BLOOD  Result Value Ref Range Status   Specimen Description BLOOD L FIN  Final   Special Requests   Final    BOTTLES DRAWN AEROBIC ONLY Blood Culture results may not be optimal due to an inadequate volume of blood received in culture bottles   Culture   Final    NO GROWTH 5 DAYS Performed at [facility], [address], [place], [place] [address]    Report Status 04/22/2023 FINAL  Final  MRSA Next Gen by PCR, Nasal     Status: None   Collection Time: 04/17/23  5:24 AM   Specimen: Nasal Mucosa; Nasal Swab  Result Value Ref Range Status   MRSA by PCR Next Gen NOT DETECTED NOT DETECTED Final    Comment: (NOTE) The GeneXpert MRSA Assay (FDA approved for NASAL specimens only), is one component of a comprehensive MRSA colonization surveillance program. It is not intended to diagnose MRSA infection nor to guide or monitor treatment for MRSA infections. Test performance is not FDA approved in patients less than 28 years old. Performed at [facility], [address] [place], [place] [address]   Urine Culture (for pregnant, neutropenic or urologic patients or patients with an  indwelling urinary catheter)     Status: None   Collection Time: 04/17/23  1:30 PM   Specimen: Urine, Clean Catch  Result Value Ref Range Status   Specimen Description URINE, CLEAN CATCH  Final   Special Requests NONE  Final   Culture   Final    NO GROWTH Performed at [facility], [address], [place], [place] [address]    Report Status 04/19/2023 FINAL  Final  Urine Culture     Status: None   Collection Time: 04/23/23  1:30 PM   Specimen: Urine, Catheterized  Result Value Ref Range Status   Specimen Description URINE, CATHETERIZED  Final   Special Requests NONE  Final   Culture   Final    NO GROWTH Performed at [facility], [address] [place], [place] [address]    Report Status 04/24/2023 FINAL  Final      Radiology Studies: No results found.    LOS: 8 days    Jacquelin Hawking, MD [facility] 04/25/2023, 10:44 AM   If 7PM-7AM, please contact night-coverage [email or web address]

## 2023-04-25 NOTE — Progress Notes (Signed)
Inpatient Rehab Admissions Coordinator:    I spoke with pt.'s daughter regarding family's ability to provide 24/7 support after CiR and she states family cannot arrange this. She states SNF in [place] would be preferable. CIR will sign off.   Megan Salon, MS, CCC-SLP Rehab Admissions Coordinator  [phone] (celll) [phone] (office)

## 2023-04-26 DIAGNOSIS — G9340 Encephalopathy, unspecified: Secondary | ICD-10-CM | POA: Diagnosis not present

## 2023-04-26 LAB — RENAL FUNCTION PANEL
Albumin: 2.3 g/dL — ABNORMAL LOW (ref 3.5–5.0)
Anion gap: 6 (ref 5–15)
BUN: 48 mg/dL — ABNORMAL HIGH (ref 8–23)
CO2: 20 mmol/L — ABNORMAL LOW (ref 22–32)
Calcium: 8.2 mg/dL — ABNORMAL LOW (ref 8.9–10.3)
Chloride: 107 mmol/L (ref 98–111)
Creatinine, Ser: 3.14 mg/dL — ABNORMAL HIGH (ref 0.61–1.24)
GFR, Estimated: 20 mL/min — ABNORMAL LOW (ref >60)
Glucose, Bld: 103 mg/dL — ABNORMAL HIGH (ref 70–99)
Phosphorus: 3.9 mg/dL (ref 2.5–4.6)
Potassium: 3.6 mmol/L (ref 3.5–5.1)
Sodium: 133 mmol/L — ABNORMAL LOW (ref 135–145)

## 2023-04-26 LAB — GLUCOSE, CAPILLARY
Glucose-Capillary: 100 mg/dL — ABNORMAL HIGH (ref 70–99)
Glucose-Capillary: 125 mg/dL — ABNORMAL HIGH (ref 70–99)
Glucose-Capillary: 85 mg/dL (ref 70–99)
Glucose-Capillary: 107 mg/dL — ABNORMAL HIGH (ref 70–99)
Glucose-Capillary: 108 mg/dL — ABNORMAL HIGH (ref 70–99)

## 2023-04-26 NOTE — Progress Notes (Addendum)
Calorie Count Note  48-hour calorie count ordered.  Diet: full liquid Supplements:  - Ensure Enlive TID  Day 1: 5/10 Lunch: 194 kcal, 3 grams of protein 5/10 Dinner: no documentation available 5/11 Breakfast: no documentation available Supplements: 700 kcal, 40 grams of protein  Day 1 total 24-hour intake: 894 kcal (45% of minimum estimated needs)  43 grams of protein (41% of minimum estimated needs)  Day 2: 5/11 Lunch: no documentation available 5/11 Dinner: no documentation available 5/12 Breakfast: no documentation available Supplements: 700 kcal, 40 grams of protein  Day 2 total 24-hour intake: 700 kcal (35% of minimum estimated needs)  40 grams of protein (38% of minimum estimated needs)  Nutrition Diagnosis: 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  Goal: Patient will meet greater than or equal to 90% of their needs.  Intervention:  - d/c calorie count - Continue Ensure Enlive TID - Add Magic Cup TID - Diet advancement as able - Multiple meal documentation slips missing so unable to determine how much pt was consuming at these meals. After speaking with pt and visualizing breakfast meal tray from today, suspect pt has been meeting >/=75% of estimated needs via PO intake. Recommend Cortrak removal.   Mertie Clause, MS, RD, LDN Inpatient Clinical Dietitian Please see AMiON for contact information.

## 2023-04-26 NOTE — Progress Notes (Signed)
Physical Therapy Treatment Patient Details Name: Chad Winters MRN: [id number] DOB: 01-29-1946 Today's Date: 04/26/2023   History of Present Illness Patient presenting to ED on 04/17/23 after being found down in his backyard. Patient hypothermic, bradycardic, and intubated. Multiple lesions on scalp, BUEs, and BLEs. CT head/c-spine showing acute compression fractures of c7 and T1, neurosurgery recommending C collar. CT chest/abd/pelvis w/ prior whipple procedure w/ slight edema around pancreatic body and tail concerncing for acute interstitial pancreatitis; possible enteritis; and possible ileus vs. Low-grade obstruction of small bowel; low-density lesion in liver. MRI is pending. No PMH on file.    PT Comments    Patient progressing with ambulation needing less support and walking further distances with very minimal pain reports.  Continues to need reminders about precautions, orientation and safety.  Patient would likely need support 24/7 at home for a bit and per rehab note family unable to support this.  May need less intensive inpatient rehab, though pt is perseverating on frustration with his conversation with the rehab coordinator and asking to speak with MD.  If he declines may need maximum HH services.  PT will continue to follow.    Recommendations for follow up therapy are one component of a multi-disciplinary discharge planning process, led by the attending physician.  Recommendations may be updated based on patient status, additional functional criteria and insurance authorization.  Follow Up Recommendations  Can patient physically be transported by private vehicle: Yes    Assistance Recommended at Discharge Frequent or constant Supervision/Assistance  Patient can return home with the following A little help with walking and/or transfers;Assistance with cooking/housework;Direct supervision/assist for medications management;Assist for transportation;Help with stairs or ramp for  entrance;Direct supervision/assist for financial management;A little help with bathing/dressing/bathroom   Equipment Recommendations  Rolling walker (2 wheels)    Recommendations for Other Services       Precautions / Restrictions Precautions Precautions: Fall;Cervical;Other (comment) Required Braces or Orthoses: Cervical Brace Cervical Brace: Hard collar;At all times     Mobility  Bed Mobility Overal bed mobility: Needs Assistance Bed Mobility: Rolling, Sidelying to Sit Rolling: Min guard Sidelying to sit: Min assist       General bed mobility comments: cues for reminders about technique with precautions; assist for trunk upright    Transfers Overall transfer level: Needs assistance Equipment used: Rolling walker (2 wheels) Transfers: Sit to/from Stand Sit to Stand: Min guard           General transfer comment: standing with very little assistance for balance    Ambulation/Gait Ambulation/Gait assistance: Min guard, Supervision Gait Distance (Feet): 150 Feet Assistive device: Rolling walker (2 wheels) Gait Pattern/deviations: Step-through pattern, Decreased stride length, Wide base of support, Shuffle       General Gait Details: ran into trash receptacle in middle of the hallway with walker, assist for balance and safety, esp on turns or in tight spaces   Stairs             Wheelchair Mobility    Modified Rankin (Stroke Patients Only)       Balance Overall balance assessment: Needs assistance Sitting-balance support: Feet supported Sitting balance-Leahy Scale: Fair     Standing balance support: Bilateral upper extremity supported Standing balance-Leahy Scale: Poor Standing balance comment: reliant on external assist                            Cognition Arousal/Alertness: Awake/alert Behavior During Therapy: Agitated Overall Cognitive Status: Impaired/Different from  baseline Area of Impairment: Attention, Memory, Following  commands, Safety/judgement, Awareness, Problem solving                 Orientation Level: Disoriented to, Situation Current Attention Level: Sustained Memory: Decreased recall of precautions Following Commands: Follows one step commands consistently, Follows one step commands with increased time Safety/Judgement: Decreased awareness of safety, Decreased awareness of deficits     General Comments: HOH, reportedly upset after discussion with rehab coordinator about insurance, etc.  Also asking if the ICU was a restaurant.  Seemingly confused about cord around his wrist for SpO2 thinking it was his cell phone cord        Exercises      General Comments General comments (skin integrity, edema, etc.): VSS on RA though RR in 20's and pt continued to report still upset about conversation with rehab coordinator      Pertinent Vitals/Pain Pain Assessment Pain Assessment: 0-10 Pain Score: 1  Pain Location: generalized with movement Pain Descriptors / Indicators: Discomfort Pain Intervention(s): Monitored during session    Home Living                          Prior Function            PT Goals (current goals can now be found in the care plan section) Progress towards PT goals: Progressing toward goals    Frequency    Min 4X/week      PT Plan Discharge plan needs to be updated    Co-evaluation              AM-PAC PT "6 Clicks" Mobility   Outcome Measure  Help needed turning from your back to your side while in a flat bed without using bedrails?: A Little Help needed moving from lying on your back to sitting on the side of a flat bed without using bedrails?: A Little Help needed moving to and from a bed to a chair (including a wheelchair)?: A Little Help needed standing up from a chair using your arms (e.g., wheelchair or bedside chair)?: A Little Help needed to walk in hospital room?: A Little Help needed climbing 3-5 steps with a railing? :  Total 6 Click Score: 16    End of Session Equipment Utilized During Treatment: Cervical collar;Gait belt Activity Tolerance: Patient tolerated treatment well Patient left: in chair;with call bell/phone within reach;with chair alarm set   PT Visit Diagnosis: Unsteadiness on feet (R26.81);Muscle weakness (generalized) (M62.81);History of falling (Z91.81)     Time: 1610-9604 PT Time Calculation (min) (ACUTE ONLY): 18 min  Charges:  $Gait Training: 8-22 mins                     Sheran Lawless, PT Acute Rehabilitation Services [phone] 04/26/2023    Chad Winters 04/26/2023, 12:39 PM

## 2023-04-26 NOTE — Progress Notes (Signed)
PROGRESS NOTE    Chad Winters  ZOX:[id number] DOB: 04-Aug-1946 DOA: 04/17/2023 PCP: Donia Ast, MD   Brief Narrative: Chad Winters is a 77 y.o. male with a history of hypertension, gout, hyperlipidemia, ankylosing spondylitis, BPH, migraines.  Patient presented to the hospital as a level 1 trauma suffering multiple scalp lacerations with evidence of septic shock and respiratory failure requiring vasopressor and mechanical ventilation support.  Patient was admitted to the ICU on admission.  Workup was significant also for fractures of the C7 and T1 vertebra.  Neurosurgery recommendation for conservative management with cervical collar.  Patient required NG tube placement for tube feedings secondary to dysphagia.  Patient transferred out of the ICU on 5/8 and hospital service picked up on 5/9.  Speech therapy evaluated and recommended advancement of diet.  Tube feeds held.  PT/OT recommending inpatient rehab, however recommendation now is for SNF.   Assessment and Plan:  Head trauma Head lacerations Present on admission. Evaluated by trauma surgery. 7 staples placed by EDP on 5/4. Usual time to removal is 7-10 days. -Remove staples on 7/14  Acute C7 and T1 fractures Neurosurgery consulted and recommended a cervical collar with further recommendations for outpatient follow-up in clinic in 4 weeks. -Continue cervical collar  Chronic subdural hematomas/hygromas Noted. No acute management  Acute blood loss anemia Secondary to trauma and subsequent blood loss. Patient with a hemoglobin down to 6.5 requiring transfusion of 2 units of PRBC. Hemoglobin stable.  Acute enteritis Patient managed on empiric antibiotics with Vancomycin, Zosyn and Cefazolin. No culture data available. Antibiotics transitioned to Linezolid and Zosyn before finally transitioning to Ceftriaxone. WBC trended down. -Continue Ceftriaxone; will treat for 10 to 14-day course  Septic shock Present on  admission. Secondary to acute enteritis. Associated hypothermia. Patient required vasopressor support while in ICU and has weaned off successfully. Shock resolved. Blood cultures with no growth.  Acute metabolic/toxic encephalopathy Appears to be resolved.   Acute respiratory failure with hypoxia Patient required intubation and mechanical ventilation on admission (5/4). He was successfully extubated on 5/7. He was weaned successfully to room air.  AKI Secondary to possible ATN in setting of hypotension from septic shock. Complicated by septic shock. Creatinine peak of 4.50. Nephrology consulted and recommended IV fluids, now transitioned to increased free water. Creatinine down to 3.51 today. -Continued nephrology recommendations  Dysphagia NG tube placed on 5/8 and patient is currently managed on tube feeds. SLP has now recommended a full liquid diet -Continue full liquid diet; await SLP updated recommendations as provided -Remove NG tube  Hypokalemia Potassium supplementation given. Resolved.  Hypocalcemia Normal once corrected for hypoalbuminemia  Abdominal distension Stable. No evidence of ileus or obstruction. -Monitor  Hypophosphatemia Resolved with phosphorus repletion, however now with mild hyperphosphatemia.  Possible pancreatitis Noted CT evidence of slight edema of pancreatic body and tail with mildly elevated lipase. No symptoms consistent with pancreatitis at this time.  Shock liver Secondary to septic shock. AST/ALT peak of 3,014/1,017 respectively with significant improvement.  Liver lesion Noted on CT imaging with recommendation for MRI w/wo contrast for further evaluation -Will obtain MRI abdomen w/wo contrast once kidney function is improved  Thrombocytopenia Secondary to critical illness. Platelets down to 108,000. Thrombocytopenia now resolved.  Moderate malnutrition Noted. Patient is currently on tube feeds secondary to dysphagia  DVT prophylaxis:  Lovenox Code Status:   Code Status: Full Code Family Communication: None at bedside Disposition Plan: Discharge likely in 2-3 days pending improvement of AKI and hopeful removal advancement of  diet. Anticipate discharge to SNF   Consultants:  PCCM Nephrology Trauma Neurosurgery  Procedures:  5/4-5/7: Intubation and mechanical ventilation 5/8: Core track tube placement  Antimicrobials: Vancomycin Zosyn Cefazolin Ceftriaxone    Subjective: Patient reports eating well. Continues to have loose bowel movements. No abdominal pain.  Objective: BP (!) 163/90 (BP Location: Left Arm)   Pulse 96   Temp 97.7 F (36.5 C) (Oral)   Resp 20   Ht 6' (1.829 m)   Wt 71.2 kg   SpO2 91%   BMI 21.29 kg/m   Examination:  General exam: Appears calm and comfortable Respiratory system: Clear to auscultation. Respiratory effort normal. Cardiovascular system: S1 & S2 heard, RRR Gastrointestinal system: Abdomen is distended, soft and nontender. Normal bowel sounds heard. Central nervous system: Alert and oriented. No focal neurological deficits. Musculoskeletal: No calf tenderness Psychiatry: Judgement and insight appear normal. Mood & affect appropriate.   Data Reviewed: I have personally reviewed following labs and imaging studies  CBC Lab Results  Component Value Date   WBC 6.8 04/22/2023   RBC 3.08 (L) 04/22/2023   HGB 9.2 (L) 04/22/2023   HCT 27.1 (L) 04/22/2023   MCV 88.0 04/22/2023   MCH 29.9 04/22/2023   PLT 155 04/22/2023   MCHC 33.9 04/22/2023   RDW 15.9 (H) 04/22/2023   LYMPHSABS 1.2 04/22/2023   MONOABS 0.9 04/22/2023   EOSABS 0.2 04/22/2023   BASOSABS 0.0 04/22/2023     Last metabolic panel Lab Results  Component Value Date   NA 133 (L) 04/26/2023   K 3.6 04/26/2023   CL 107 04/26/2023   CO2 20 (L) 04/26/2023   BUN 48 (H) 04/26/2023   CREATININE 3.14 (H) 04/26/2023   GLUCOSE 103 (H) 04/26/2023   GFRNONAA 20 (L) 04/26/2023   CALCIUM 8.2 (L) 04/26/2023    PHOS 3.9 04/26/2023   PROT 4.8 (L) 04/20/2023   ALBUMIN 2.3 (L) 04/26/2023   BILITOT 0.8 04/20/2023   ALKPHOS 100 04/20/2023   AST 418 (H) 04/20/2023   ALT 46 (H) 04/20/2023   ANIONGAP 6 04/26/2023    GFR: Estimated Creatinine Clearance: 20.5 mL/min (A) (by C-G formula based on SCr of 3.14 mg/dL (H)).  Recent Results (from the past 240 hour(s))  Culture, blood (routine x 2)     Status: None   Collection Time: 04/17/23  2:51 AM   Specimen: BLOOD RIGHT HAND  Result Value Ref Range Status   Specimen Description BLOOD RIGHT HAND  Final   Special Requests   Final    BOTTLES DRAWN AEROBIC AND ANAEROBIC Blood Culture adequate volume   Culture   Final    NO GROWTH 5 DAYS Performed at [facility], [address], [place], [place] [address]    Report Status 04/22/2023 FINAL  Final  Culture, blood (routine x 2)     Status: None   Collection Time: 04/17/23  2:56 AM   Specimen: BLOOD  Result Value Ref Range Status   Specimen Description BLOOD L FIN  Final   Special Requests   Final    BOTTLES DRAWN AEROBIC ONLY Blood Culture results may not be optimal due to an inadequate volume of blood received in culture bottles   Culture   Final    NO GROWTH 5 DAYS Performed at [facility], [address] [place], [place] [address]    Report Status 04/22/2023 FINAL  Final  MRSA Next Gen by PCR, Nasal     Status: None   Collection  Time: 04/17/23  5:24 AM   Specimen: Nasal Mucosa; Nasal Swab  Result Value Ref Range Status   MRSA by PCR Next Gen NOT DETECTED NOT DETECTED Final    Comment: (NOTE) The GeneXpert MRSA Assay (FDA approved for NASAL specimens only), is one component of a comprehensive MRSA colonization surveillance program. It is not intended to diagnose MRSA infection nor to guide or monitor treatment for MRSA infections. Test performance is not FDA approved in patients less than 8 years old. Performed at [facility], [address], [place],  [place] [address]   Urine Culture (for pregnant, neutropenic or urologic patients or patients with an indwelling urinary catheter)     Status: None   Collection Time: 04/17/23  1:30 PM   Specimen: Urine, Clean Catch  Result Value Ref Range Status   Specimen Description URINE, CLEAN CATCH  Final   Special Requests NONE  Final   Culture   Final    NO GROWTH Performed at [facility], [address], [place], [place] [address]    Report Status 04/19/2023 FINAL  Final  Urine Culture     Status: None   Collection Time: 04/23/23  1:30 PM   Specimen: Urine, Catheterized  Result Value Ref Range Status   Specimen Description URINE, CATHETERIZED  Final   Special Requests NONE  Final   Culture   Final    NO GROWTH Performed at [facility], [address] [place], [place] [address]    Report Status 04/24/2023 FINAL  Final      Radiology Studies: No results found.    LOS: 9 days    Jacquelin Hawking, MD [facility] 04/26/2023, 7:37 AM   If 7PM-7AM, please contact night-coverage [email or web address]

## 2023-04-26 NOTE — Progress Notes (Signed)
Inpatient Rehab Admissions Coordinator:   I spoke with Pt. Regarding family stating that they cannot arrange 24/7 support after d/c. Informed him that he would need to pursue SNF rehab instead. He is resistant to this idea but also confused and stating he does not know if he wants to do rehab on CIR either and that no one can "make him do it." I will let TOC know to begin search for SNF. CIR will sign off.   Megan Salon, MS, CCC-SLP Rehab Admissions Coordinator  [phone] (celll) [phone] (office)

## 2023-04-26 NOTE — TOC Initial Note (Signed)
Transition of Care [facility]) - Initial/Assessment Note    Patient Details  Name: Chad Winters MRN: [id number] Date of Birth: [birth year]  Transition of Care [facility]) CM/SW Contact:    Erin Sons, LCSW Phone Number: 04/26/2023, 3:32 PM  Clinical Narrative:                  CSW met with pt to discuss disposition/therapy recs. Pt lives at home alone. He is agreeable to SNF w/u. States he has been to one before but it was a few years ago and he was only there for 1 day due to covid. He believes it may have been [facility] and would like to go there if possible. CSW explained SNF w/u and insurance auth processes. Fl2 completed and bed requests sent in hub.   Expected Discharge Plan: Skilled Nursing Facility Barriers to Discharge: Continued Medical Work up, SNF Pending bed offer   Patient Goals and CMS Choice            Expected Discharge Plan and Services                                              Prior Living Arrangements/Services   Lives with:: Self Patient language and need for interpreter reviewed:: Yes        Need for Family Participation in Patient Care: No (Comment) Care giver support system in place?: Yes (comment)   Criminal Activity/Legal Involvement Pertinent to Current Situation/Hospitalization: No - Comment as needed  Activities of Daily Living      Permission Sought/Granted                  Emotional Assessment Appearance:: Appears stated age Attitude/Demeanor/Rapport: Engaged Affect (typically observed): Accepting Orientation: : Oriented to Self, Oriented to Place, Oriented to  Time, Oriented to Situation Alcohol / Substance Use: Not Applicable Psych Involvement: No (comment)  Admission diagnosis:  Acute encephalopathy [G93.40] Patient Active Problem List   Diagnosis Date Noted   Malnutrition of moderate degree 04/20/2023   Acute encephalopathy 04/17/2023   Septic shock (HCC) 04/17/2023   Closed nondisplaced fracture of seventh  cervical vertebra (HCC) 04/17/2023   Acute respiratory failure (HCC) 04/17/2023   AKI (acute kidney injury) (HCC) 04/17/2023   Metabolic acidosis 04/17/2023   PCP:  Donia Ast, MD Pharmacy:   [facility] [phone] [place], [place] - [address] AT [facility] [address] [facility] [address] [place] [address] Phone: [phone] Fax: [phone]     Social Determinants of Health (SDOH) Social History:   SDOH Interventions:     Readmission Risk Interventions     No data to display

## 2023-04-26 NOTE — Progress Notes (Signed)
Occupational Therapy Treatment Patient Details Name: Chad Winters MRN: [id number] DOB: [birth year] Today's Date: 04/26/2023   History of present illness Patient presenting to ED on 04/17/23 after being found down in his backyard. Patient hypothermic, bradycardic, and intubated. Multiple lesions on scalp, BUEs, and BLEs. CT head/c-spine showing acute compression fractures of c7 and T1, neurosurgery recommending C collar. CT chest/abd/pelvis w/ prior whipple procedure w/ slight edema around pancreatic body and tail concerncing for acute interstitial pancreatitis; possible enteritis; and possible ileus vs. Low-grade obstruction of small bowel; low-density lesion in liver. MRI is pending. No PMH on file.   OT comments  Patient continues to make steady progress towards goals in skilled OT session. Patient's session encompassed transition to upright position in chair to eat lunch as patient was spilling potato soup all over C collar. Patient remains confused, and also frustrated he may have to go to a nursing home, but easily redirectable in comparison to PT session. Patient stating he "walked around the bottom of the building twice" earlier. Discharge updated as family cannot provide 24/7 assist. OT will continue to follow.    Recommendations for follow up therapy are one component of a multi-disciplinary discharge planning process, led by the attending physician.  Recommendations may be updated based on patient status, additional functional criteria and insurance authorization.    Assistance Recommended at Discharge Frequent or constant Supervision/Assistance  Patient can return home with the following  A little help with walking and/or transfers;A lot of help with bathing/dressing/bathroom;Assistance with cooking/housework;Assistance with feeding;Direct supervision/assist for medications management;Direct supervision/assist for financial management;Assist for transportation;Help with stairs or ramp for  entrance   Equipment Recommendations  Other (comment) (defer to next venue)    Recommendations for Other Services      Precautions / Restrictions Precautions Precautions: Fall;Cervical;Other (comment) Required Braces or Orthoses: Cervical Brace Cervical Brace: Hard collar;At all times       Mobility Bed Mobility Overal bed mobility: Needs Assistance Bed Mobility: Rolling, Sidelying to Sit Rolling: Min guard Sidelying to sit: Min guard       General bed mobility comments: cues for reminders about technique with precautions    Transfers Overall transfer level: Needs assistance Equipment used: 1 person hand held assist Transfers: Sit to/from Stand Sit to Stand: Min assist           General transfer comment: impulsive to stand and eat, pulling up on OTs bicep to achieve sit<>stand     Balance Overall balance assessment: Needs assistance Sitting-balance support: Feet supported Sitting balance-Leahy Scale: Fair     Standing balance support: Bilateral upper extremity supported Standing balance-Leahy Scale: Poor Standing balance comment: reliant on external assist                           ADL either performed or assessed with clinical judgement   ADL Overall ADL's : Needs assistance/impaired Eating/Feeding: Minimal assistance;Sitting Eating/Feeding Details (indicate cue type and reason): patient attempting to eat potato soup in c collar in bed Grooming: Set up;Sitting                   Toilet Transfer: Minimal assistance;Stand-pivot;Cueing for safety;Cueing for sequencing           Functional mobility during ADLs: Minimal assistance;Cueing for safety;Cueing for sequencing General ADL Comments: Patient remains confused, focused on potato soup, poor insight into deficits, pulling up on OTs bicep to come into standing    Extremity/Trunk Assessment  Vision       Perception     Praxis      Cognition  Arousal/Alertness: Awake/alert Behavior During Therapy: Agitated Overall Cognitive Status: Impaired/Different from baseline Area of Impairment: Attention, Memory, Following commands, Safety/judgement, Awareness, Problem solving                 Orientation Level: Disoriented to, Situation Current Attention Level: Sustained Memory: Decreased recall of precautions Following Commands: Follows one step commands consistently, Follows one step commands with increased time Safety/Judgement: Decreased awareness of safety, Decreased awareness of deficits Awareness: Emergent Problem Solving: Difficulty sequencing, Requires verbal cues General Comments: HOH, reportedly upset after discussion with rehab coordinator about insurance, etc.  Stated he walked around "the bottom of building twice" earlier        Exercises      Shoulder Instructions       General Comments VSS    Pertinent Vitals/ Pain       Pain Assessment Pain Assessment: No/denies pain  Home Living                                          Prior Functioning/Environment              Frequency  Min 2X/week        Progress Toward Goals  OT Goals(current goals can now be found in the care plan section)  Progress towards OT goals: Progressing toward goals  Acute Rehab OT Goals Patient Stated Goal: to not go to a nursing home OT Goal Formulation: With patient Time For Goal Achievement: 05/05/23 Potential to Achieve Goals: Good  Plan Discharge plan needs to be updated    Co-evaluation                 AM-PAC OT "6 Clicks" Daily Activity     Outcome Measure   Help from another person eating meals?: A Little Help from another person taking care of personal grooming?: A Little Help from another person toileting, which includes using toliet, bedpan, or urinal?: A Lot Help from another person bathing (including washing, rinsing, drying)?: A Lot Help from another person to put on and  taking off regular upper body clothing?: A Little Help from another person to put on and taking off regular lower body clothing?: A Lot 6 Click Score: 15    End of Session Equipment Utilized During Treatment: Cervical collar  OT Visit Diagnosis: Unsteadiness on feet (R26.81);Other abnormalities of gait and mobility (R26.89);Repeated falls (R29.6);Muscle weakness (generalized) (M62.81);History of falling (Z91.81);Other symptoms and signs involving cognitive function;Pain   Activity Tolerance Patient tolerated treatment well   Patient Left in chair;with call bell/phone within reach;with chair alarm set   Nurse Communication Mobility status        Time: 7253-6644 OT Time Calculation (min): 10 min  Charges: OT General Charges $OT Visit: 1 Visit OT Treatments $Self Care/Home Management : 8-22 mins  Pollyann Glen E. Arlin Sass, OTR/L Acute Rehabilitation Services [phone]   Cherlyn Cushing 04/26/2023, 3:27 PM

## 2023-04-26 NOTE — [place] FL2 (Signed)
[place] MEDICAID FL2 LEVEL OF CARE FORM     IDENTIFICATION  Patient Name: Chad Winters Birthdate: 1946/03/14 Sex: male Admission Date (Current Location): 04/17/2023  [facility] and [place] Number:  Producer, television/film/video and Address:  [facility] [place], [address] [place], [place] [address]      Provider Number: [id number]  Attending Physician Name and Address:  Narda Bonds, MD  Relative Name and Phone Number:  Myles Rosenthal [phone]    Current Level of Care: Hospital Recommended Level of Care: Skilled Nursing Facility Prior Approval Number:    Date Approved/Denied:   PASRR Number: [id number]  Discharge Plan: SNF    Current Diagnoses: Patient Active Problem List   Diagnosis Date Noted   Malnutrition of moderate degree 04/20/2023   Acute encephalopathy 04/17/2023   Septic shock (HCC) 04/17/2023   Closed nondisplaced fracture of seventh cervical vertebra (HCC) 04/17/2023   Acute respiratory failure (HCC) 04/17/2023   AKI (acute kidney injury) (HCC) 04/17/2023   Metabolic acidosis 04/17/2023    Orientation RESPIRATION BLADDER Height & Weight     Self, Time, Situation, Place  Normal Continent, External catheter Weight: 156 lb 15.5 oz (71.2 kg) Height:  6' (182.9 cm)  BEHAVIORAL SYMPTOMS/MOOD NEUROLOGICAL BOWEL NUTRITION STATUS      Incontinent Diet (see dc summary)  AMBULATORY STATUS COMMUNICATION OF NEEDS Skin   Extensive Assist Verbally Other (Comment) (Closed incision-upper head 04/17/23)                       Personal Care Assistance Level of Assistance  Bathing, Dressing, Feeding Bathing Assistance: Limited assistance Feeding assistance: Independent Dressing Assistance: Limited assistance     Functional Limitations Info  Sight, Hearing, Speech Sight Info: Adequate Hearing Info: Impaired Speech Info: Adequate    SPECIAL CARE FACTORS FREQUENCY  PT (By licensed PT), OT (By licensed OT)     PT Frequency: 5x week OT  Frequency: 5x week            Contractures Contractures Info: Not present    Additional Factors Info  Code Status, Allergies, Insulin Sliding Scale Code Status Info: Full Allergies Info: Cirpro, Codeine, Zestril   Insulin Sliding Scale Info: see dc summary       Current Medications (04/26/2023):  This is the current hospital active medication list Current Facility-Administered Medications  Medication Dose Route Frequency Provider Last Rate Last Admin   0.9 %  sodium chloride infusion  250 mL Intravenous Continuous Pia Mau D, PA-C       amLODipine (NORVASC) tablet 5 mg  5 mg Oral Daily Darnell Level, MD   5 mg at 04/26/23 1660   Chlorhexidine Gluconate Cloth 2 % PADS 6 each  6 each Topical Daily Paliwal, Aditya, MD   6 each at 04/26/23 0855   docusate (COLACE) 50 MG/5ML liquid 100 mg  100 mg Per Tube BID PRN Pia Mau D, PA-C       enoxaparin (LOVENOX) injection 30 mg  30 mg Subcutaneous Q24H Cheri Fowler, MD   30 mg at 04/26/23 0853   feeding supplement (ENSURE ENLIVE / ENSURE PLUS) liquid 237 mL  237 mL Oral TID BM Narda Bonds, MD   237 mL at 04/26/23 1423   fiber supplement (BANATROL TF) liquid 60 mL  60 mL Per Tube BID Cheri Fowler, MD   60 mL at 04/26/23 0853   folic acid (FOLVITE) tablet 1 mg  1 mg Per Tube Daily Nettey,  Howell Pringle, MD   1 mg at 04/26/23 0853   free water 250 mL  250 mL Per Tube Q6H Cheri Fowler, MD   250 mL at 04/26/23 6213   Gerhardt's butt cream   Topical PRN Narda Bonds, MD   Given at 04/22/23 1634   hydrogen peroxide 3 % external solution   Topical BID Bedelia Person, MD   1 Application at 04/25/23 2134   insulin aspart (novoLOG) injection 0-9 Units  0-9 Units Subcutaneous Q4H Cheri Fowler, MD   1 Units at 04/26/23 0853   loperamide (IMODIUM) capsule 2 mg  2 mg Oral PRN Narda Bonds, MD   2 mg at 04/26/23 1423   multivitamin with minerals tablet 1 tablet  1 tablet Per Tube Daily Pia Mau D, PA-C   1 tablet at 04/26/23 0865    mupirocin ointment (BACTROBAN) 2 %   Topical BID Bedelia Person, MD   Given at 04/26/23 [phone]   Oral care mouth rinse  15 mL Mouth Rinse 4 times per day Cheri Fowler, MD   15 mL at 04/26/23 1113   Oral care mouth rinse  15 mL Mouth Rinse PRN Cheri Fowler, MD       pantoprazole (PROTONIX) EC tablet 40 mg  40 mg Oral QHS Rexford Maus, RPH   40 mg at 04/25/23 2131   polyethylene glycol (MIRALAX / GLYCOLAX) packet 17 g  17 g Per Tube Daily PRN Lidia Collum, PA-C       thiamine (VITAMIN B1) tablet 100 mg  100 mg Per Tube Daily Narda Bonds, MD   100 mg at 04/26/23 9629     Discharge Medications: Please see discharge summary for a list of discharge medications.  Relevant Imaging Results:  Relevant Lab Results:   Additional Information SSN [id number]  Carmina Miller, LCSWA

## 2023-04-27 DIAGNOSIS — G9340 Encephalopathy, unspecified: Secondary | ICD-10-CM | POA: Diagnosis not present

## 2023-04-27 LAB — RENAL FUNCTION PANEL
Albumin: 2.4 g/dL — ABNORMAL LOW (ref 3.5–5.0)
Anion gap: 13 (ref 5–15)
BUN: 45 mg/dL — ABNORMAL HIGH (ref 8–23)
CO2: 20 mmol/L — ABNORMAL LOW (ref 22–32)
Calcium: 8.6 mg/dL — ABNORMAL LOW (ref 8.9–10.3)
Chloride: 106 mmol/L (ref 98–111)
Creatinine, Ser: 2.92 mg/dL — ABNORMAL HIGH (ref 0.61–1.24)
GFR, Estimated: 22 mL/min — ABNORMAL LOW (ref >60)
Glucose, Bld: 94 mg/dL (ref 70–99)
Phosphorus: 5 mg/dL — ABNORMAL HIGH (ref 2.5–4.6)
Potassium: 3.7 mmol/L (ref 3.5–5.1)
Sodium: 139 mmol/L (ref 135–145)

## 2023-04-27 LAB — GLUCOSE, CAPILLARY
Glucose-Capillary: 142 mg/dL — ABNORMAL HIGH (ref 70–99)
Glucose-Capillary: 76 mg/dL (ref 70–99)
Glucose-Capillary: 98 mg/dL (ref 70–99)
Glucose-Capillary: 99 mg/dL (ref 70–99)

## 2023-04-27 MED ORDER — TAMSULOSIN HCL 0.4 MG PO CAPS
0.4000 mg | ORAL_CAPSULE | Freq: Every day | ORAL | Status: DC
  Administered 2023-04-27 – 2023-04-29 (×3): 0.4 mg via ORAL
  Filled 2023-04-27 (×3): qty 1

## 2023-04-27 NOTE — TOC Progression Note (Signed)
Transition of Care [facility]) - Progression Note    Patient Details  Name: Chad Winters MRN: [id number] Date of Birth: [birth year]  [facility]) CM/SW Contact  Erin Sons, [place] Phone Number: 04/27/2023, 1:53 PM  Clinical Narrative:     CSW provided pt with bed offers. He is interested in [facility] which has not offered a bed. CSW agreed to check with them.   CSW spoke with [facility] admissions; they will review referral.    Expected Discharge Plan: Skilled Nursing Facility Barriers to Discharge: Continued Medical Work up, SNF Pending bed offer  Expected Discharge Plan and Services                                               Social Determinants of Health (SDOH) Interventions    Readmission Risk Interventions     No data to display
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
## 2023-04-28 DIAGNOSIS — K529 Noninfective gastroenteritis and colitis, unspecified: Secondary | ICD-10-CM | POA: Insufficient documentation

## 2023-04-28 DIAGNOSIS — N170 Acute kidney failure with tubular necrosis: Secondary | ICD-10-CM | POA: Insufficient documentation

## 2023-04-28 DIAGNOSIS — R1310 Dysphagia, unspecified: Secondary | ICD-10-CM

## 2023-04-28 DIAGNOSIS — Y92009 Unspecified place in unspecified non-institutional (private) residence as the place of occurrence of the external cause: Secondary | ICD-10-CM

## 2023-04-28 DIAGNOSIS — D62 Acute posthemorrhagic anemia: Secondary | ICD-10-CM | POA: Insufficient documentation

## 2023-04-28 DIAGNOSIS — I10 Essential (primary) hypertension: Secondary | ICD-10-CM | POA: Insufficient documentation

## 2023-04-28 DIAGNOSIS — S22019A Unspecified fracture of first thoracic vertebra, initial encounter for closed fracture: Secondary | ICD-10-CM | POA: Insufficient documentation

## 2023-04-28 DIAGNOSIS — I6203 Nontraumatic chronic subdural hemorrhage: Secondary | ICD-10-CM | POA: Insufficient documentation

## 2023-04-28 DIAGNOSIS — E440 Moderate protein-calorie malnutrition: Secondary | ICD-10-CM

## 2023-04-28 DIAGNOSIS — W19XXXA Unspecified fall, initial encounter: Secondary | ICD-10-CM | POA: Diagnosis not present

## 2023-04-28 DIAGNOSIS — K769 Liver disease, unspecified: Secondary | ICD-10-CM | POA: Insufficient documentation

## 2023-04-28 DIAGNOSIS — K7200 Acute and subacute hepatic failure without coma: Secondary | ICD-10-CM | POA: Insufficient documentation

## 2023-04-28 LAB — RENAL FUNCTION PANEL
Albumin: 2.4 g/dL — ABNORMAL LOW (ref 3.5–5.0)
Anion gap: 9 (ref 5–15)
BUN: 45 mg/dL — ABNORMAL HIGH (ref 8–23)
CO2: 20 mmol/L — ABNORMAL LOW (ref 22–32)
Calcium: 8.5 mg/dL — ABNORMAL LOW (ref 8.9–10.3)
Chloride: 107 mmol/L (ref 98–111)
Creatinine, Ser: 2.86 mg/dL — ABNORMAL HIGH (ref 0.61–1.24)
GFR, Estimated: 22 mL/min — ABNORMAL LOW (ref >60)
Glucose, Bld: 93 mg/dL (ref 70–99)
Phosphorus: 4.7 mg/dL — ABNORMAL HIGH (ref 2.5–4.6)
Potassium: 4 mmol/L (ref 3.5–5.1)
Sodium: 136 mmol/L (ref 135–145)

## 2023-04-28 LAB — GLUCOSE, CAPILLARY: Glucose-Capillary: 97 mg/dL (ref 70–99)

## 2023-04-28 MED ORDER — ADULT MULTIVITAMIN W/MINERALS CH
1.0000 | ORAL_TABLET | Freq: Every day | ORAL | Status: DC
  Administered 2023-04-29: 1 via ORAL
  Filled 2023-04-28: qty 1

## 2023-04-28 MED ORDER — POLYETHYLENE GLYCOL 3350 17 G PO PACK: 17.0000 g | PACK | Freq: Every day | ORAL | Status: DC | PRN

## 2023-04-28 MED ORDER — FOLIC ACID 1 MG PO TABS
1.0000 mg | ORAL_TABLET | Freq: Every day | ORAL | Status: DC
  Administered 2023-04-29: 1 mg via ORAL
  Filled 2023-04-28: qty 1

## 2023-04-28 MED ORDER — THIAMINE MONONITRATE 100 MG PO TABS
100.0000 mg | ORAL_TABLET | Freq: Every day | ORAL | Status: DC
  Administered 2023-04-29: 100 mg via ORAL
  Filled 2023-04-28: qty 1

## 2023-04-28 MED ORDER — ACETAMINOPHEN 325 MG PO TABS
650.0000 mg | ORAL_TABLET | Freq: Four times a day (QID) | ORAL | Status: DC | PRN
  Administered 2023-04-29 (×2): 650 mg via ORAL
  Filled 2023-04-28 (×2): qty 2

## 2023-04-28 MED ORDER — DOCUSATE SODIUM 50 MG/5ML PO LIQD: 100.0000 mg | Freq: Two times a day (BID) | ORAL | Status: DC | PRN
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
## 2023-04-28 NOTE — Progress Notes (Signed)
Speech Language Pathology Treatment: Dysphagia  Patient Details Name: Chad Winters MRN: [id number] DOB: [birth year] Today's Date: 04/28/2023 Time: 4098-1191 SLP Time Calculation (min) (ACUTE ONLY): 15 min  Assessment / Plan / Recommendation Clinical Impression  Pt was seen for dysphagia treatment. He was alert and cooperative during the session. Pt and his nurse reported that he has been tolerating meals and taking medications whole with liquids without signs of aspiration. Pt tolerated regular texture solids and thin liquids via straw without overt s/s of aspiration. Mastication and oral clearance were WFL and pt did not demonstrate any symptoms of pharyngeal residue. Pt's diet will be advanced to regular texture solids and thin liquids. SLP will follow to ensure tolerance.    HPI HPI: Patient is a 77 y.o. male with PMH: ETOH abuse, who presented to the ED on 04/17/23 s/p unwittnessed fall, found down in backyard by unknown bystander with multiple head lacerations and abrasions to BLE and BUE as well as C7 and T1 fractures. CT head showed chronic SDH's, CT abd/chest/pelvis showed prior Whipple procedure, possible acute interstitial pancreatitis, possible enteritis, possible ileus vs low-grade obstruction of small bowel. CXR showed no acute abnormality. He become obtunded after picked up and intubated in ED (extubated 04/20/23 at 0918) . NG feeding tube placed, foley placed, he was given IV fluids and 2 units of blood for presumed bleeding. Neurosurgery recommended no surgical intervention, continue C-collar. He failed Yale swallow with RN who reported he coughed with sips of water.      SLP Plan  Continue with current plan of care      Recommendations for follow up therapy are one component of a multi-disciplinary discharge planning process, led by the attending physician.  Recommendations may be updated based on patient status, additional functional criteria and insurance authorization.     Recommendations  Diet recommendations: Regular;Thin liquid Liquids provided via: Cup;Straw Medication Administration: Whole meds with liquid Supervision: Intermittent supervision to cue for compensatory strategies Compensations: Slow rate;Small sips/bites Postural Changes and/or Swallow Maneuvers: Seated upright 90 degrees                  Oral care BID     Dysphagia, unspecified (R13.10)     Continue with current plan of care    Chad Winters I. Chad Clock, MS, CCC-SLP Neuro Diagnostic Specialist  Acute Rehabilitation Services Office number: [phone]  Chad Winters  04/28/2023, 3:48 PM

## 2023-04-28 NOTE — Progress Notes (Signed)
PROGRESS NOTE    Chad Winters  WUJ:[id number] DOB: 04/07/1946 DOA: 04/17/2023 PCP: Donia Ast, MD     Brief Narrative:  Chad Winters is a 76 y.o. male with a history of hypertension, gout, hyperlipidemia, ankylosing spondylitis, BPH, migraines.  Patient presented to the hospital as a level 1 trauma suffering multiple scalp lacerations with evidence of septic shock and respiratory failure requiring vasopressor and mechanical ventilation support.  Patient was admitted to the ICU on admission.  Workup was significant also for fractures of the C7 and T1 vertebra.  Neurosurgery recommendation for conservative management with cervical collar.  Patient required NG tube placement for tube feedings secondary to dysphagia.  Patient transferred out of the ICU on 5/8 and hospital service picked up on 5/9.  Speech therapy evaluated and recommended advancement of diet.  Tube feeds held.  PT/OT recommending inpatient rehab, however recommendation now is for SNF.  New events last 24 hours / Subjective: Patient complained of lower abdominal pain, thought it may be due to Foley catheter.  Patient is having good urine output with continuous improvement in creatinine.  Foley catheter removed today for voiding trial.  Otherwise, patient is feeling well today.  Awaiting SNF placement.  Assessment & Plan:   Principal Problem:   Fall at home, initial encounter Active Problems:   Acute encephalopathy   Septic shock (HCC)   Closed nondisplaced fracture of seventh cervical vertebra (HCC)   Acute hypoxemic respiratory failure (HCC)   AKI (acute kidney injury) (HCC)   Metabolic acidosis   Malnutrition of moderate degree   T1 vertebral fracture (HCC)   HTN (hypertension)   Chronic subdural hematoma (HCC)   Acute blood loss anemia   Enteritis   Shock liver   ATN (acute tubular necrosis) (HCC)   Dysphagia   Liver lesion   Status post fall with injuries -Presented after being found in his  backyard, presented as a level 1 trauma.  Presented with multiple lacerations on scalp, low BP, hypothermia, bradycardia.  Patient admitted to [facility] service after being intubated due to poor GCS. -Now stable, awaiting SNF placement  Acute C7, T1 fracture -Neurosurgery consulted, recommended cervical collar -Follow-up in clinic 4 weeks  Hypertension -Norvasc  Chronic subdural hematoma/hygromas -Stable  Acute blood loss anemia -Secondary to trauma and subsequent blood loss -Status post 2 unit packed red blood cells  Septic shock secondary to acute enteritis -Present on admission -Shock resolved -Completed antibiotic course  Shock liver -Improved  Acute metabolic/toxic encephalopathy -Resolved  Acute hypoxemic respiratory failure -Successfully extubated, resolved  AKI -Secondary to ATN -Baseline creatinine 1.3-1.7 -Appreciate nephrology, signed off -Creatinine continues to improve  Dysphagia -NG tube removed -SLP eval --> advanced to dysphagia 3 diet  Liver lesions -Found on CT imaging -Recommend MRI abdomen with and without contrast     DVT prophylaxis:  enoxaparin (LOVENOX) injection 30 mg Start: 04/20/23 0930 SCDs Start: 04/17/23 0349  Code Status: Full code Family Communication: No family at bedside Disposition Plan: SNF placement pending Status is: Inpatient Remains inpatient appropriate because: SNF placement    Antimicrobials:  Anti-infectives (From admission, onward)    Start     Dose/Rate Route Frequency Ordered Stop   04/21/23 0945  cefTRIAXone (ROCEPHIN) 2 g in sodium chloride 0.9 % 100 mL IVPB        2 g 200 mL/hr over 30 Minutes Intravenous Every 24 hours 04/21/23 0852 04/23/23 1026   04/19/23 1200  piperacillin-tazobactam (ZOSYN) IVPB 3.375 g  Status:  Discontinued  3.375 g 12.5 mL/hr over 240 Minutes Intravenous Every 12 hours 04/19/23 0817 04/21/23 0852   04/19/23 0600  vancomycin (VANCOCIN) IVPB 1000 mg/200 mL premix  Status:   Discontinued        1,000 mg 200 mL/hr over 60 Minutes Intravenous Every 48 hours 04/17/23 0400 04/17/23 1311   04/19/23 0600  vancomycin (VANCOREADY) IVPB 750 mg/150 mL  Status:  Discontinued        750 mg 150 mL/hr over 60 Minutes Intravenous Every 48 hours 04/17/23 1311 04/18/23 1043   04/19/23 0600  linezolid (ZYVOX) IVPB 600 mg  Status:  Discontinued        600 mg 300 mL/hr over 60 Minutes Intravenous Every 12 hours 04/18/23 1044 04/19/23 0813   04/18/23 1400  piperacillin-tazobactam (ZOSYN) IVPB 2.25 g  Status:  Discontinued        2.25 g 100 mL/hr over 30 Minutes Intravenous Every 6 hours 04/18/23 0859 04/19/23 0817   04/17/23 0800  piperacillin-tazobactam (ZOSYN) IVPB 3.375 g  Status:  Discontinued        3.375 g 12.5 mL/hr over 240 Minutes Intravenous Every 8 hours 04/17/23 0400 04/18/23 0859   04/17/23 0300  vancomycin (VANCOREADY) IVPB 1250 mg/250 mL        1,250 mg 166.7 mL/hr over 90 Minutes Intravenous  Once 04/17/23 0209 04/17/23 0443   04/17/23 0215  piperacillin-tazobactam (ZOSYN) IVPB 3.375 g        3.375 g 100 mL/hr over 30 Minutes Intravenous  Once 04/17/23 0209 04/17/23 0313   04/17/23 0115  ceFAZolin (ANCEF) IVPB 2g/100 mL premix        2 g 200 mL/hr over 30 Minutes Intravenous  Once 04/17/23 0107 04/17/23 0205        Objective: Vitals:   04/28/23 0302 04/28/23 0400 04/28/23 0739 04/28/23 1118  BP: (!) 148/62  (!) 164/71 (!) 166/80  Pulse: 85  81 99  Resp: 19  18 18   Temp: 98.9 F (37.2 C)  98.6 F (37 C) 98.4 F (36.9 C)  TempSrc: Oral  Oral Oral  SpO2:   97% 90%  Weight:  71.2 kg    Height:        Intake/Output Summary (Last 24 hours) at 04/28/2023 1357 Last data filed at 04/28/2023 0839 Gross per 24 hour  Intake 360 ml  Output 2200 ml  Net -1840 ml   Filed Weights   04/26/23 0500 04/27/23 0459 04/28/23 0400  Weight: 71.2 kg 71.2 kg 71.2 kg    Examination:  General exam: Appears calm and comfortable  Respiratory system: Clear to  auscultation. Respiratory effort normal. No respiratory distress. No conversational dyspnea.  Cardiovascular system: S1 & S2 heard, RRR. No murmurs. No pedal edema. Gastrointestinal system: Abdomen is nondistended, soft and nontender. Normal bowel sounds heard. Central nervous system: Alert and oriented. No focal neurological deficits. Speech clear.  Extremities: Symmetric in appearance  Skin: No rashes, lesions or ulcers on exposed skin  Psychiatry: Judgement and insight appear normal. Mood & affect appropriate.   Data Reviewed: I have personally reviewed following labs and imaging studies  CBC: Recent Labs  Lab 04/22/23 0432  WBC 6.8  NEUTROABS 4.3  HGB 9.2*  HCT 27.1*  MCV 88.0  PLT 155   Basic Metabolic Panel: Recent Labs  Lab 04/22/23 0432 04/23/23 0425 04/24/23 0655 04/25/23 0331 04/26/23 0217 04/27/23 0357 04/28/23 0654  NA 140 140 140 137 133* 139 136  K 3.4* 3.0* 3.9 3.7 3.6 3.7 4.0  CL 98  101 106 105 107 106 107  CO2 28 23 22  21* 20* 20* 20*  GLUCOSE 150* 142* 87 97 103* 94 93  BUN 67* 69* 59* 55* 48* 45* 45*  CREATININE 4.35* 4.29* 3.82* 3.51* 3.14* 2.92* 2.86*  CALCIUM 8.2* 8.1* 8.4* 8.3* 8.2* 8.6* 8.5*  MG 2.4 2.2  --   --   --   --   --   PHOS 5.2* 2.7 3.4 4.0 3.9 5.0* 4.7*   GFR: Estimated Creatinine Clearance: 22.5 mL/min (A) (by C-G formula based on SCr of 2.86 mg/dL (H)). Liver Function Tests: Recent Labs  Lab 04/24/23 0655 04/25/23 0331 04/26/23 0217 04/27/23 0357 04/28/23 0654  ALBUMIN 2.1* 2.0* 2.3* 2.4* 2.4*   No results for input(s): "LIPASE", "AMYLASE" in the last 168 hours. No results for input(s): "AMMONIA" in the last 168 hours. Coagulation Profile: No results for input(s): "INR", "PROTIME" in the last 168 hours. Cardiac Enzymes: No results for input(s): "CKTOTAL", "CKMB", "CKMBINDEX", "TROPONINI" in the last 168 hours. BNP (last 3 results) No results for input(s): "PROBNP" in the last 8760 hours. HbA1C: No results for  input(s): "HGBA1C" in the last 72 hours. CBG: Recent Labs  Lab 04/27/23 0006 04/27/23 0415 04/27/23 0820 04/27/23 1118 04/28/23 0736  GLUCAP 98 76 99 142* 97   Lipid Profile: No results for input(s): "CHOL", "HDL", "LDLCALC", "TRIG", "CHOLHDL", "LDLDIRECT" in the last 72 hours. Thyroid Function Tests: No results for input(s): "TSH", "T4TOTAL", "FREET4", "T3FREE", "THYROIDAB" in the last 72 hours. Anemia Panel: No results for input(s): "VITAMINB12", "FOLATE", "FERRITIN", "TIBC", "IRON", "RETICCTPCT" in the last 72 hours. Sepsis Labs: No results for input(s): "PROCALCITON", "LATICACIDVEN" in the last 168 hours.  Recent Results (from the past 240 hour(s))  Urine Culture     Status: None   Collection Time: 04/23/23  1:30 PM   Specimen: Urine, Catheterized  Result Value Ref Range Status   Specimen Description URINE, CATHETERIZED  Final   Special Requests NONE  Final   Culture   Final    NO GROWTH Performed at [facility], [address], [place], [place] [address]    Report Status 04/24/2023 FINAL  Final      Radiology Studies: No results found.    Scheduled Meds:  amLODipine  5 mg Oral Daily   Chlorhexidine Gluconate Cloth  6 each Topical Daily   enoxaparin (LOVENOX) injection  30 mg Subcutaneous Q24H   feeding supplement  237 mL Oral TID BM   [START ON 04/29/2023] folic acid  1 mg Oral Daily   hydrogen peroxide   Topical BID   [START ON 04/29/2023] multivitamin with minerals  1 tablet Oral Daily   mupirocin ointment   Topical BID   mouth rinse  15 mL Mouth Rinse 4 times per day   pantoprazole  40 mg Oral QHS   tamsulosin  0.4 mg Oral Daily   [START ON 04/29/2023] thiamine  100 mg Oral Daily   Continuous Infusions:  sodium chloride       LOS: 11 days   Time spent: 35 minutes   Noralee Stain, DO [facility] 04/28/2023, 1:57 PM   Available via Epic secure chat 7am-7pm After these hours, please refer to coverage provider listed on [email or web address]

## 2023-04-28 NOTE — TOC Progression Note (Addendum)
Transition of Care [facility]) - Progression Note    Patient Details  Name: Chad Winters MRN: [id number] Date of Birth: 04/11/1946  Transition of Care [facility]) CM/SW Contact  Lorri Frederick, LCSW Phone Number: 04/28/2023, 10:55 AM  Clinical Narrative:   [facility] does make bed offer.  CSW spoke with pt, who requests that CSW confirm with daughter that this is SNF she wants.  CSW LM with daughter Dorina Hoyer.   1130: TC message from Daughter  She does want to accept offer from [facility]. [facility] notified and confirms they can accept pt tomorrow.  1400: SNF auth request submitted in [place].    Expected Discharge Plan: Skilled Nursing Facility Barriers to Discharge: Continued Medical Work up, SNF Pending bed offer  Expected Discharge Plan and Services                                               Social Determinants of Health (SDOH) Interventions    Readmission Risk Interventions     No data to display

## 2023-04-29 DIAGNOSIS — K769 Liver disease, unspecified: Secondary | ICD-10-CM | POA: Diagnosis not present

## 2023-04-29 DIAGNOSIS — I6203 Nontraumatic chronic subdural hemorrhage: Secondary | ICD-10-CM | POA: Diagnosis not present

## 2023-04-29 DIAGNOSIS — S0101XD Laceration without foreign body of scalp, subsequent encounter: Secondary | ICD-10-CM | POA: Diagnosis not present

## 2023-04-29 DIAGNOSIS — S22019D Unspecified fracture of first thoracic vertebra, subsequent encounter for fracture with routine healing: Secondary | ICD-10-CM | POA: Diagnosis not present

## 2023-04-29 DIAGNOSIS — G43909 Migraine, unspecified, not intractable, without status migrainosus: Secondary | ICD-10-CM | POA: Diagnosis not present

## 2023-04-29 DIAGNOSIS — Z4689 Encounter for fitting and adjustment of other specified devices: Secondary | ICD-10-CM | POA: Diagnosis not present

## 2023-04-29 DIAGNOSIS — S22019A Unspecified fracture of first thoracic vertebra, initial encounter for closed fracture: Secondary | ICD-10-CM | POA: Diagnosis not present

## 2023-04-29 DIAGNOSIS — J9601 Acute respiratory failure with hypoxia: Secondary | ICD-10-CM | POA: Diagnosis not present

## 2023-04-29 DIAGNOSIS — E440 Moderate protein-calorie malnutrition: Secondary | ICD-10-CM | POA: Diagnosis not present

## 2023-04-29 DIAGNOSIS — M459 Ankylosing spondylitis of unspecified sites in spine: Secondary | ICD-10-CM | POA: Diagnosis not present

## 2023-04-29 DIAGNOSIS — Z7401 Bed confinement status: Secondary | ICD-10-CM | POA: Diagnosis not present

## 2023-04-29 DIAGNOSIS — J9600 Acute respiratory failure, unspecified whether with hypoxia or hypercapnia: Secondary | ICD-10-CM | POA: Diagnosis not present

## 2023-04-29 DIAGNOSIS — Y906 Blood alcohol level of 120-199 mg/100 ml: Secondary | ICD-10-CM | POA: Diagnosis not present

## 2023-04-29 DIAGNOSIS — N183 Chronic kidney disease, stage 3 unspecified: Secondary | ICD-10-CM | POA: Diagnosis not present

## 2023-04-29 DIAGNOSIS — E782 Mixed hyperlipidemia: Secondary | ICD-10-CM | POA: Diagnosis not present

## 2023-04-29 DIAGNOSIS — Y92008 Other place in unspecified non-institutional (private) residence as the place of occurrence of the external cause: Secondary | ICD-10-CM | POA: Diagnosis not present

## 2023-04-29 DIAGNOSIS — S12601D Unspecified nondisplaced fracture of seventh cervical vertebra, subsequent encounter for fracture with routine healing: Secondary | ICD-10-CM | POA: Diagnosis not present

## 2023-04-29 DIAGNOSIS — I10 Essential (primary) hypertension: Secondary | ICD-10-CM | POA: Diagnosis not present

## 2023-04-29 DIAGNOSIS — S12600A Unspecified displaced fracture of seventh cervical vertebra, initial encounter for closed fracture: Secondary | ICD-10-CM | POA: Diagnosis not present

## 2023-04-29 DIAGNOSIS — E538 Deficiency of other specified B group vitamins: Secondary | ICD-10-CM | POA: Diagnosis not present

## 2023-04-29 DIAGNOSIS — D62 Acute posthemorrhagic anemia: Secondary | ICD-10-CM | POA: Diagnosis not present

## 2023-04-29 DIAGNOSIS — Y92009 Unspecified place in unspecified non-institutional (private) residence as the place of occurrence of the external cause: Secondary | ICD-10-CM | POA: Diagnosis not present

## 2023-04-29 DIAGNOSIS — W19XXXD Unspecified fall, subsequent encounter: Secondary | ICD-10-CM | POA: Diagnosis not present

## 2023-04-29 DIAGNOSIS — A419 Sepsis, unspecified organism: Secondary | ICD-10-CM | POA: Diagnosis not present

## 2023-04-29 DIAGNOSIS — K219 Gastro-esophageal reflux disease without esophagitis: Secondary | ICD-10-CM | POA: Diagnosis not present

## 2023-04-29 DIAGNOSIS — W11XXXA Fall on and from ladder, initial encounter: Secondary | ICD-10-CM | POA: Diagnosis not present

## 2023-04-29 DIAGNOSIS — M069 Rheumatoid arthritis, unspecified: Secondary | ICD-10-CM | POA: Diagnosis not present

## 2023-04-29 DIAGNOSIS — I129 Hypertensive chronic kidney disease with stage 1 through stage 4 chronic kidney disease, or unspecified chronic kidney disease: Secondary | ICD-10-CM | POA: Diagnosis not present

## 2023-04-29 DIAGNOSIS — I471 Supraventricular tachycardia, unspecified: Secondary | ICD-10-CM | POA: Diagnosis not present

## 2023-04-29 DIAGNOSIS — M109 Gout, unspecified: Secondary | ICD-10-CM | POA: Diagnosis not present

## 2023-04-29 DIAGNOSIS — Z8679 Personal history of other diseases of the circulatory system: Secondary | ICD-10-CM | POA: Diagnosis not present

## 2023-04-29 DIAGNOSIS — W19XXXA Unspecified fall, initial encounter: Secondary | ICD-10-CM | POA: Diagnosis not present

## 2023-04-29 DIAGNOSIS — Z23 Encounter for immunization: Secondary | ICD-10-CM | POA: Diagnosis not present

## 2023-04-29 LAB — BASIC METABOLIC PANEL
Anion gap: 13 (ref 5–15)
BUN: 50 mg/dL — ABNORMAL HIGH (ref 8–23)
CO2: 20 mmol/L — ABNORMAL LOW (ref 22–32)
Calcium: 8.6 mg/dL — ABNORMAL LOW (ref 8.9–10.3)
Chloride: 101 mmol/L (ref 98–111)
Creatinine, Ser: 2.93 mg/dL — ABNORMAL HIGH (ref 0.61–1.24)
GFR, Estimated: 22 mL/min — ABNORMAL LOW (ref >60)
Glucose, Bld: 111 mg/dL — ABNORMAL HIGH (ref 70–99)
Potassium: 4 mmol/L (ref 3.5–5.1)
Sodium: 134 mmol/L — ABNORMAL LOW (ref 135–145)

## 2023-04-29 LAB — CBC
HCT: 29.8 % — ABNORMAL LOW (ref 39.0–52.0)
Hemoglobin: 9.6 g/dL — ABNORMAL LOW (ref 13.0–17.0)
MCH: 29.4 pg (ref 26.0–34.0)
MCHC: 32.2 g/dL (ref 30.0–36.0)
MCV: 91.1 fL (ref 80.0–100.0)
Platelets: 385 10*3/uL (ref 150–400)
RBC: 3.27 MIL/uL — ABNORMAL LOW (ref 4.22–5.81)
RDW: 16 % — ABNORMAL HIGH (ref 11.5–15.5)
WBC: 5.3 10*3/uL (ref 4.0–10.5)
nRBC: 0 % (ref 0.0–0.2)

## 2023-04-29 NOTE — Progress Notes (Signed)
Physical Therapy Treatment Patient Details Name: Chad Winters MRN: [id number] DOB: 01/29/1946 Today's Date: 04/29/2023   History of Present Illness Patient presenting to ED on 04/17/23 after being found down in his backyard. Patient hypothermic, bradycardic, and intubated. Multiple lesions on scalp, BUEs, and BLEs. CT head/c-spine showing acute compression fractures of c7 and T1, neurosurgery recommending C collar. CT chest/abd/pelvis w/ prior whipple procedure w/ slight edema around pancreatic body and tail concerncing for acute interstitial pancreatitis; possible enteritis; and possible ileus vs. Low-grade obstruction of small bowel; low-density lesion in liver. MRI is pending. No PMH on file.    PT Comments    Patient progressing with mobility and working on balance though still needs cues and assist for safety.  Patient walking some in room without walker noting more shuffling pattern and with core instability and fall risk.  Feel he will need 24/7 assist for safety at d/c.  PT will continue to follow.    Recommendations for follow up therapy are one component of a multi-disciplinary discharge planning process, led by the attending physician.  Recommendations may be updated based on patient status, additional functional criteria and insurance authorization.  Follow Up Recommendations       Assistance Recommended at Discharge Frequent or constant Supervision/Assistance  Patient can return home with the following A little help with walking and/or transfers;Assistance with cooking/housework;Direct supervision/assist for medications management;Assist for transportation;Help with stairs or ramp for entrance;Direct supervision/assist for financial management;A little help with bathing/dressing/bathroom   Equipment Recommendations  Rolling walker (2 wheels)    Recommendations for Other Services       Precautions / Restrictions Precautions Precautions: Fall;Cervical Required Braces or  Orthoses: Cervical Brace Cervical Brace: Hard collar;At all times     Mobility  Bed Mobility   Bed Mobility: Rolling, Sidelying to Sit Rolling: Supervision Sidelying to sit: Supervision       General bed mobility comments: cues for technique    Transfers Overall transfer level: Needs assistance Equipment used: Rolling walker (2 wheels) Transfers: Sit to/from Stand Sit to Stand: Min guard           General transfer comment: assist for balance    Ambulation/Gait Ambulation/Gait assistance: Min guard, Supervision Gait Distance (Feet): 150 Feet Assistive device: Rolling walker (2 wheels) Gait Pattern/deviations: Step-through pattern, Decreased stride length, Shuffle, Drifts right/left       General Gait Details: assist for safety, continues to look to sides (medical gas dials, out window, etc) and veers with walker to sides.  Needs assist for safety, balance, in room walking some without walker and noteably more unsteady with more shuffle pattern   Stairs             Wheelchair Mobility    Modified Rankin (Stroke Patients Only)       Balance Overall balance assessment: Needs assistance Sitting-balance support: Feet supported Sitting balance-Leahy Scale: Good     Standing balance support: No upper extremity supported Standing balance-Leahy Scale: Fair Standing balance comment: can stand without UE support, walking some in room no device               High Level Balance Comments: side stepping holding rail in hallway with cues for forward facing, ant/post rocking with rail for ankle mobility and limits of stability, hip extension x 10 each leg and mini squats x 10 with UE support            Cognition Arousal/Alertness: Awake/alert Behavior During Therapy: WFL for tasks assessed/performed Overall Cognitive Status:  Impaired/Different from baseline Area of Impairment: Attention, Memory, Following commands, Safety/judgement, Awareness, Problem  solving                   Current Attention Level: Sustained Memory: Decreased short-term memory Following Commands: Follows one step commands consistently Safety/Judgement: Decreased awareness of safety, Decreased awareness of deficits   Problem Solving: Difficulty sequencing, Requires verbal cues          Exercises      General Comments        Pertinent Vitals/Pain Pain Assessment Pain Score: 0-No pain    Home Living                          Prior Function            PT Goals (current goals can now be found in the care plan section) Progress towards PT goals: Progressing toward goals    Frequency    Min 3X/week      PT Plan Current plan remains appropriate    Co-evaluation              AM-PAC PT "6 Clicks" Mobility   Outcome Measure  Help needed turning from your back to your side while in a flat bed without using bedrails?: A Little Help needed moving from lying on your back to sitting on the side of a flat bed without using bedrails?: A Little Help needed moving to and from a bed to a chair (including a wheelchair)?: A Little Help needed standing up from a chair using your arms (e.g., wheelchair or bedside chair)?: A Little Help needed to walk in hospital room?: A Little Help needed climbing 3-5 steps with a railing? : Total 6 Click Score: 16    End of Session Equipment Utilized During Treatment: Gait belt;Cervical collar Activity Tolerance: Patient tolerated treatment well Patient left: with chair alarm set;with call bell/phone within reach;in chair   PT Visit Diagnosis: Unsteadiness on feet (R26.81);Muscle weakness (generalized) (M62.81);History of falling (Z91.81)     Time: 1205-1229 PT Time Calculation (min) (ACUTE ONLY): 24 min  Charges:  $Gait Training: 8-22 mins $Therapeutic Activity: 8-22 mins                     Sheran Lawless, PT Acute Rehabilitation Services Office:[phone] 04/29/2023    [name] 04/29/2023, 12:40 PM

## 2023-04-29 NOTE — Progress Notes (Signed)
Nutrition Follow-up  DOCUMENTATION CODES:   Non-severe (moderate) malnutrition in context of social or environmental circumstances  INTERVENTION:   - Ensure Enlive po TID, each supplement provides 350 kcal and 20 grams of protein  - Magic Cup TID with meals, each supplement provides 290 kcal and 9 grams of protein  - Continue MVI, thiamine, and folic acid  NUTRITION DIAGNOSIS:   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  Ongoing, being addressed via diet advancement and oral nutrition supplements  GOAL:   Patient will meet greater than or equal to 90% of their needs  Progressing  MONITOR:   PO intake, Supplement acceptance, Labs, Weight trends  REASON FOR ASSESSMENT:   Consult Assessment of nutrition requirement/status, Enteral/tube feeding initiation and management  ASSESSMENT:   Pt with ETOH abuse admitted post fall found down in backyard with multiple head lacerations and abrasions to BLE and BUE, C7 and T1 fxs. Found to have chronic SDHs and per CT pt with previous whipple, possible acute interstitial pancreatitis, possible enteritis, and possible ileus vs SBO.  05/04 - found down in backyard, pt with multiple head lacerations and abrasions to BLE and BUE 05/07 - extubated 05/08 - failed swallow, Cortrak placed (per x-ray in gastric body), TF started 05/09 - diet advanced to full liquids 05/10 - TF d/c per MD 05/13 - Cortrak removed 05/14 - diet advanced to dysphagia 3 with thin liquids 05/15 - diet advanced to regular with thin liquids  Pt is stable for d/c and is pending SNF placement. Pt is on a regular diet with thin liquids and is tolerating well. Pt completing 60-100% of meals.  Weight stable; current weight of 71.2 kg appears to be copied over daily since 04/23/23.  Per nursing assessment, pt with mild pitting generalized edema and non-pitting edema  to BUE.  Admit weight: 62.1 kg Current weight: 71.2 kg  Meal Completion: 60-100%  Medications reviewed and include: Ensure Enlive TID, folic acid, MVI with minerals, protonix, thiamine  Labs reviewed: sodium 134, BUN 50, creatinine 2.93, phosphorus 4.7 on 5/15, hemoglobin 9.6 CBG's: 76-142 x 24 hours  UOP: 2725 ml x 24 hours I/O's: -1.1 L since admit  Diet Order:   Diet Order             Diet regular Room service appropriate? No; Fluid consistency: Thin  Diet effective now                   EDUCATION NEEDS:   No education needs have been identified at this time  Skin:  Skin Assessment: Skin Integrity Issues: Incisions: closed posterior upper head  Last BM:  04/28/23 small type 5  Height:   Ht Readings from Last 1 Encounters:  04/17/23 6' (1.829 m)    Weight:   Wt Readings from Last 1 Encounters:  04/29/23 71.2 kg    Ideal Body Weight:  80.9 kg  BMI:  Body mass index is 21.29 kg/m.  Estimated Nutritional Needs:   Kcal:  2000-2200  Protein:  105-135 grams  Fluid:  >2 L/day    Mertie Clause, MS, RD, LDN Inpatient Clinical Dietitian Please see AMiON for contact information.

## 2023-04-29 NOTE — TOC Progression Note (Signed)
Transition of Care [facility]) - Progression Note    Patient Details  Name: Chad Winters MRN: [id number] Date of Birth: 02/06/1946  Transition of Care [facility]) CM/SW Contact  Eduard Roux, [place] Phone Number: 04/29/2023, 1:54 PM  Clinical Narrative:     Insurance approval [id number] reference # [id number]  Expected Discharge Plan: Skilled Nursing Facility Barriers to Discharge: Continued Medical Work up, SNF Pending bed offer  Expected Discharge Plan and Services         Expected Discharge Date: 04/29/23                                     Social Determinants of Health (SDOH) Interventions    Readmission Risk Interventions     No data to display
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
## 2023-04-29 NOTE — Discharge Summary (Signed)
Physician Discharge Summary  Chad Winters NFA:[id number] DOB: 03-20-1946 DOA: 04/17/2023  PCP: Donia Ast, MD  Admit date: 04/17/2023 Discharge date: 04/29/2023  Admitted From: Home Disposition:  SNF   Recommendations for Outpatient Follow-up:  Follow up with PCP in 1 week Follow up with neurosurgery Incidental finding of liver lesion. Recommend MRI abdomen with and without contrast  Discharge Condition: Stable CODE STATUS: Full code Diet recommendation:  Diet Orders (From admission, onward)     Start     Ordered   04/28/23 1510  Diet regular Room service appropriate? No; Fluid consistency: Thin  Diet effective now       Question Answer Comment  Room service appropriate? No   Fluid consistency: Thin      04/28/23 1509           Brief/Interim Summary: Chad Winters is a 77 y.o. male with a history of hypertension, gout, hyperlipidemia, ankylosing spondylitis, BPH, migraines.  Patient presented to the hospital as a level 1 trauma suffering multiple scalp lacerations with evidence of septic shock and respiratory failure requiring vasopressor and mechanical ventilation support.  Patient was admitted to the ICU on admission.  Workup was significant also for fractures of the C7 and T1 vertebra.  Neurosurgery recommendation for conservative management with cervical collar.  Patient required NG tube placement for tube feedings secondary to dysphagia.  Patient transferred out of the ICU on 5/8 and hospital service picked up on 5/9.  Speech therapy evaluated and recommended advancement of diet.  Tube feeds held.  PT/OT recommending inpatient rehab, however recommendation now is for SNF.    Discharge Diagnoses:   Principal Problem:   Fall at home, initial encounter Active Problems:   Acute encephalopathy   Septic shock (HCC)   Closed nondisplaced fracture of seventh cervical vertebra (HCC)   Acute hypoxemic respiratory failure (HCC)   AKI (acute kidney injury) (HCC)    Metabolic acidosis   Malnutrition of moderate degree   T1 vertebral fracture (HCC)   HTN (hypertension)   Chronic subdural hematoma (HCC)   Acute blood loss anemia   Enteritis   Shock liver   ATN (acute tubular necrosis) (HCC)   Dysphagia   Liver lesion   Status post fall with injuries -Presented after being found in his backyard, presented as a level 1 trauma.  Presented with multiple lacerations on scalp, low BP, hypothermia, bradycardia.  Patient admitted to [facility] service after being intubated due to poor GCS. -Recommended SNF placement   Acute C7, T1 fracture -Neurosurgery consulted, recommended cervical collar -Follow-up in clinic 4 weeks with C-spine x-ray   Hypertension -Norvasc   Chronic subdural hematoma/hygromas -Stable   Acute blood loss anemia -Secondary to trauma and subsequent blood loss -Status post 2 unit packed red blood cells -Stable   Septic shock secondary to acute enteritis -Present on admission -Shock resolved -Completed antibiotic course   Shock liver -Improved   Acute metabolic/toxic encephalopathy -Resolved   Acute hypoxemic respiratory failure -Successfully extubated, resolved   AKI -Secondary to ATN -Baseline creatinine 1.3-1.7 -Appreciate nephrology, signed off -Creatinine improved and has plateaued 2.8-2.9   Dysphagia -NG tube removed -SLP eval --> advanced to regular diet   Liver lesions -Found on CT imaging -Recommend MRI abdomen with and without contrast  Discharge Instructions  Discharge Instructions     Increase activity slowly   Complete by: As directed    No wound care   Complete by: As directed       Allergies as of 04/29/2023  Reactions   Cipro [ciprofloxacin Hcl] Hives, Itching   Codeine Nausea And Vomiting, Other (See Comments)   Muscle stiffness    Zestril [lisinopril] Cough        Medication List     STOP taking these medications    aspirin EC 81 MG tablet       TAKE these  medications    allopurinol 100 MG tablet Commonly known as: ZYLOPRIM Take 100 mg by mouth daily.   amLODipine 10 MG tablet Commonly known as: NORVASC Take 10 mg by mouth daily.   atorvastatin 40 MG tablet Commonly known as: LIPITOR Take 40 mg by mouth daily.   DULoxetine 20 MG capsule Commonly known as: CYMBALTA Take 20 mg by mouth 2 (two) times daily.   finasteride 5 MG tablet Commonly known as: PROSCAR Take 5 mg by mouth daily.   Humira (2 Pen) 40 MG/0.4ML Pnkt Generic drug: Adalimumab Inject 40 mg into the skin every 14 (fourteen) days.   omeprazole 20 MG capsule Commonly known as: PRILOSEC Take 20 mg by mouth daily.   topiramate 50 MG tablet Commonly known as: TOPAMAX Take 50 mg by mouth 2 (two) times daily.   VITAMIN B-12 PO Take 1 tablet by mouth daily.   VITAMIN D-3 PO Take 1 capsule by mouth daily.        Follow-up Information     Bedelia Person, MD Follow up in 3 week(s).   Specialty: Neurosurgery Why: For hospital follow-up Contact information: [address] [place] [address] [phone]                Allergies  Allergen Reactions   Cipro [Ciprofloxacin Hcl] Hives and Itching   Codeine Nausea And Vomiting and Other (See Comments)    Muscle stiffness    Zestril [Lisinopril] Cough    Procedures/Studies: DG Abd Portable 1V  Result Date: 04/23/2023 CLINICAL DATA:  Abdominal distension EXAM: PORTABLE ABDOMEN - 1 VIEW COMPARISON:  04/21/2023 FINDINGS: Supine frontal view of the abdomen and pelvis excludes hemidiaphragms by collimation. Enteric catheter tip projects over the gastric body. Bowel gas pattern is unremarkable without obstruction or ileus. No masses or abnormal calcifications. Bilateral hip arthroplasties. IMPRESSION: 1. Enteric catheter tip projecting over the gastric body. Electronically Signed   By: Sharlet Salina M.D.   On: 04/23/2023 11:17   DG Abd Portable 1V  Result Date: 04/21/2023 CLINICAL DATA:   Feeding tube placement EXAM: PORTABLE ABDOMEN - 1 VIEW COMPARISON:  CT 04/17/2023 FINDINGS: Semi-erect artifact degraded view of the abdomen and upper pelvis with multiple wires and leads projecting over the chest. Feeding tube terminates at the gastric body. No gross free intraperitoneal air or bowel obstruction. Cardiomegaly. IMPRESSION: Feeding tube terminating at the gastric body. Electronically Signed   By: Jeronimo Greaves M.D.   On: 04/21/2023 14:52   US Abdomen Limited RUQ (LIVER/GB)  Result Date: 04/17/2023 CLINICAL DATA:  Elevated liver enzymes. EXAM: ULTRASOUND ABDOMEN LIMITED RIGHT UPPER QUADRANT COMPARISON:  CT with IV contrast earlier today. FINDINGS: Gallbladder: Surgically absent. Common bile duct: Diameter: Within normal limits.  Measures 4 mm. Liver: No focal mass identified. There is increased echogenicity consistent with fatty replacement as noted on CT. Corresponding to the low-density lesion in the lower medial right lobe of the liver noted on CT and above the Hounsfield density of simple fluid, there is a thin walled anechoic uncomplicated simple cyst measuring 2.3 x 2.1 x 1.7 cm. No other focal abnormality is seen in the  liver. Portal vein is patent on color Doppler imaging with normal direction of blood flow towards the liver. Other: There is trace perihepatic free fluid. IMPRESSION: 1. No acute findings in the right upper quadrant. 2. Hepatic steatosis. 3. Simple anechoic cyst in the right lobe of the liver. 4. Trace perihepatic free fluid. Electronically Signed   By: Almira Bar M.D.   On: 04/17/2023 07:32   CT CHEST ABDOMEN PELVIS W CONTRAST  Addendum Date: 04/17/2023   ADDENDUM REPORT: 04/17/2023 02:55 ADDENDUM: Close clinical follow-up and monitoring of renal function is recommended. Electronically Signed   By: Almira Bar M.D.   On: 04/17/2023 02:55   Result Date: 04/17/2023 CLINICAL DATA:  Fall injury with blunt polytrauma. EXAM: CT CHEST, ABDOMEN, AND PELVIS WITH CONTRAST  TECHNIQUE: Multidetector CT imaging of the chest, abdomen and pelvis was performed following the standard protocol during bolus administration of intravenous contrast. RADIATION DOSE REDUCTION: This exam was performed according to the departmental dose-optimization program which includes automated exposure control, adjustment of the mA and/or kV according to patient size and/or use of iterative reconstruction technique. CONTRAST:  75mL OMNIPAQUE IOHEXOL 350 MG/ML SOLN COMPARISON:  CT chest, abdomen and pelvis with contrast 09/21/2022, and CTA chest, abdomen and pelvis 06/27/2022. Both of these were performed at [facility] and therefore reports for these studies are unavailable. FINDINGS: CT CHEST FINDINGS Cardiovascular: The cardiac size is stable, slightly enlarged, with again noted apical myocardial thinning and fatty infiltration consistent with a chronic apical infarct. There are similar chronic changes to the left lateral ventricular wall as well. The pulmonary veins are decompressed. There are moderate to heavy three-vessel coronary artery calcifications. The pulmonary arteries are normal in caliber and are centrally clear on this nondedicated exam. Thoracic aorta is tortuous, with scattered calcification along the valve leaflets. There is moderate to heavy calcific plaque in the aorta and great vessels but no appreciable great vessel stenosis greater than 50%. There is a patent normal variant origin of left vertebral artery from the aortic arch. There is no aortic aneurysm, stenosis or dissection. Mediastinum/Nodes: ETT is in place with tip 2.4 cm from the carina. The trachea is clear. The main bronchi are clear. NGT is in place entering the stomach and directed to the left with the tip abutting the wall of the proximal fundus. There is retained fluid in the mid to distal esophagus versus reflux. No wall thickening. There is heterogeneous thyroid without a visible focal nodule. No  axillary or intrathoracic adenopathy is seen. Lungs/Pleura: There is a small low-density layering left pleural effusion. There is no right pleural effusion. There is no pneumothorax. There is mild posterior atelectasis in the lung bases. Scattered chronic linear scar-like opacities in the lower lobes. There is a calcified granuloma in the right middle lobe laterally. No noncalcified nodules, contusions or infiltrates are seen. Musculoskeletal: There are mild acute anterior wedge compression fractures of the C7 and T1 vertebral bodies. No spinal canal hematoma is seen. There is mild wedging of the T3 vertebral body which was noted previously and is unchanged. There are multilevel thoracic spine bridging syndesmophytes, contiguously from down to T12 where there is a again noted a mild chronic anterior wedge compression deformity. There are subacute partially healed fractures of the right posterior tenth and eleventh ribs and chronic healed fracture deformities of the right posterolateral sixth and seventh ribs. On the left, there are partially healed subacute fractures of the posterior second rib, of the posterior eleventh and twelfth  ribs and of the posterolateral tenth rib, with chronic healed overriding fracture deformity of the anterior second rib. No acute displaced rib fracture is suspected. CT ABDOMEN PELVIS FINDINGS Hepatobiliary: In the lower medial aspect of hepatic segment 5, there is an ovoid hypodense lesion again noted, larger than on 09/21/2022 and was not seen on 06/27/2022. This currently measures 2.7 x 1.6 cm with a Hounsfield density of 37 and previously measured 2.3 x 1.3 cm. There is a 5 mm stable too small to characterize hypodensity anteriorly in segment 3 on 3:59. MRI without and with contrast is recommended for further evaluation. Remaining liver is homogeneous with no evidence of an acute injury or perihepatic hematoma. Gallbladder is absent and there is no biliary dilatation. Pancreas: The  head and neck of pancreas have been previously removed and there are surgical clips in the area. There is either a pancreaticojejunostomy or pancreaticoduodenostomy anterior to the pancreatic resection. There is slight edema around the body and tail of the pancreas concerning for acute interstitial pancreatitis. There is increased pancreatic ductal prominence now measuring 4 mm. No mass is seen. Spleen: No mass enhancement or acute injury are suspected. Adrenals/Urinary Tract: There is no adrenal mass. Both kidneys are abnormal compared to prior studies with bilateral diffuse patchy hypoenhancement which could be due to multifocal infarctions, contrast nephrotoxicity or an infectious process. Multiple tiny too small to characterize hypodensities were previously noted in the kidneys but are obscured in the areas of hypoenhancement today. There is no urinary stone or obstruction. The posterior half of the bladder is obscured by spray artifact from bilateral hip replacements. The anterior bladder is mildly thickened which was seen on both of the prior studies and could be due to hypertrophy or cystitis. 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 Thickened folds are again noted in the left abdominal small bowel which were noted previously. There are mildly dilated mid abdominal small bowel segments up to 3.2 cm without visible transition and could be due to ileus or low-grade obstruction. Rest of the small bowel is normal caliber. An appendix is not seen in this patient. There is diverticulosis without evidence of diverticulitis. Vascular/Lymphatic: There is extensive heavy aortic, iliac and branch vessel atherosclerosis including both renal arteries. No AAA is seen. The pelvic sidewalls are obscured by the hip replacements, otherwise, there is no visible adenopathy. Reproductive: The prostate is obscured by the hip replacements but it was previously not enlarged. Other:  There is minimal ascites in the abdomen. No ascites in the pelvis. There are mild mesenteric congestive changes. Musculoskeletal: Osteopenia. No lumbar compression fracture. No regional skeletal fracture is seen. Right hip replacement is new from 09/21/2022. IMPRESSION: 1. Mild acute anterior wedge compression fractures of the C7 and T1 vertebral bodies. 2. No other acute trauma related findings in the chest, abdomen or pelvis. 3. Small left pleural effusion. 4. 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 5. Aortic and coronary artery atherosclerosis. 6. Retained fluid in the mid to distal esophagus versus reflux. 7. Prior Whipple procedure with slight edema around the body and tail of the pancreas concerning for acute interstitial pancreatitis, with increased prominence of the pancreatic duct since the last CT. 8. Interval development of diffuse patchy hypoenhancement of both kidneys which could be due to multifocal infarctions, contrast nephrotoxicity or an infectious process. 9. Increased size of a homogeneous low-density lesion in the lower medial aspect of segment 5 of the liver. MRI without and with contrast is  recommended. 10. Thickened folds in the left abdominal small bowel which could be due to enteritis, with mildly dilated mid abdominal small bowel segments up to 3.2 cm without visible transition. This could be due to ileus or low-grade obstruction. 11. Minimal ascites and mild mesenteric edema. 12. Osteopenia and degenerative change. 13. Critical Value/emergent results were called by telephone at the time of interpretation on 04/17/2023 at 1:51 am to provider Dr. Cliffton Asters, who verbally acknowledged these results. Electronically Signed: By: Almira Bar M.D. On: 04/17/2023 02:33   CT HEAD WO CONTRAST  Result Date: 04/17/2023 CLINICAL DATA:  Trauma EXAM: CT HEAD WITHOUT CONTRAST CT CERVICAL SPINE WITHOUT CONTRAST TECHNIQUE: Multidetector CT imaging of the head and cervical spine was  performed following the standard protocol without intravenous contrast. Multiplanar CT image reconstructions of the cervical spine were also generated. RADIATION DOSE REDUCTION: This exam was performed according to the departmental dose-optimization program which includes automated exposure control, adjustment of the mA and/or kV according to patient size and/or use of iterative reconstruction technique. COMPARISON:  03/04/2022 CT head and cervical spine, CT chest 09/21/2022 FINDINGS: CT HEAD FINDINGS Brain: Hypodense collection overlying the left frontoparietal convexity measures up to 1.2 cm (series 5, image 49). Interval decrease in the size of the previously noted right frontal convexity collection, which measures up to 0.5 cm, previously 0.8 cm when remeasured similarly. No evidence of acute infarct, hemorrhage, mass, mass effect, or midline shift. No hydrocephalus. Small focus of encephalomalacia in the right frontal lobe is unchanged. Vascular: No hyperdense vessel. Atherosclerotic calcifications in the intracranial carotid and vertebral arteries. Skull: Negative for fracture or focal lesion. Biparietal and bifrontal burr holes. Sinuses/Orbits: No acute finding. Status post bilateral lens replacements. Other: The mastoid air cells are well aerated. CT CERVICAL SPINE FINDINGS Alignment: No traumatic listhesis. Skull base and vertebrae: Acute fractures of C7 and T1, with 15% vertebral body height loss at C7 and 20% vertebral body height loss at T1. The T1 fracture appears to extend from the anterior through the posterior cortex, without significant retropulsion. The C7 fracture is more anterior than posterior. Soft tissues and spinal canal: No significant prevertebral fluid or swelling. No visible canal hematoma. Disc levels: Degenerative changes in the cervical spine. No significant spinal canal stenosis. Upper chest: For findings in the thorax, please see same day CT chest. IMPRESSION: 1. Acute fractures of  C7 and T1, with 15% vertebral body height loss at C7 and 20% vertebral body height loss at T1. The T1 fracture appears to extend from the anterior through the posterior cortex, without significant retropulsion. 2. 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 3. 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 4. No additional acute intracranial process. Imaging results were communicated on 04/17/2023 at 1:52 am to provider WHITE via secure text paging. Electronically Signed   By: Wiliam Ke M.D.   On: 04/17/2023 01:53   CT CERVICAL SPINE WO CONTRAST  Result Date: 04/17/2023 CLINICAL DATA:  Trauma EXAM: CT HEAD WITHOUT CONTRAST CT CERVICAL SPINE WITHOUT CONTRAST TECHNIQUE: Multidetector CT imaging of the head and cervical spine was performed following the standard protocol without intravenous contrast. Multiplanar CT image reconstructions of the cervical spine were also generated. RADIATION DOSE REDUCTION: This exam was performed according to the departmental dose-optimization program which includes automated exposure control, adjustment of the mA and/or kV according  to patient size and/or use of iterative reconstruction technique. COMPARISON:  03/04/2022 CT head and cervical spine, CT chest 09/21/2022 FINDINGS: CT HEAD FINDINGS Brain: Hypodense collection overlying the left frontoparietal convexity measures up to 1.2 cm (series 5, image 49). Interval decrease in the size of the previously noted right frontal convexity collection, which measures up to 0.5 cm, previously 0.8 cm when remeasured similarly. No evidence of acute infarct, hemorrhage, mass, mass effect, or midline shift. No hydrocephalus. Small focus of encephalomalacia in the right frontal lobe is unchanged. Vascular: No hyperdense vessel. Atherosclerotic calcifications  in the intracranial carotid and vertebral arteries. Skull: Negative for fracture or focal lesion. Biparietal and bifrontal burr holes. Sinuses/Orbits: No acute finding. Status post bilateral lens replacements. Other: The mastoid air cells are well aerated. CT CERVICAL SPINE FINDINGS Alignment: No traumatic listhesis. Skull base and vertebrae: Acute fractures of C7 and T1, with 15% vertebral body height loss at C7 and 20% vertebral body height loss at T1. The T1 fracture appears to extend from the anterior through the posterior cortex, without significant retropulsion. The C7 fracture is more anterior than posterior. Soft tissues and spinal canal: No significant prevertebral fluid or swelling. No visible canal hematoma. Disc levels: Degenerative changes in the cervical spine. No significant spinal canal stenosis. Upper chest: For findings in the thorax, please see same day CT chest. IMPRESSION: 1. Acute fractures of C7 and T1, with 15% vertebral body height loss at C7 and 20% vertebral body height loss at T1. The T1 fracture appears to extend from the anterior through the posterior cortex, without significant retropulsion. 2. 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 3. 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 4. No additional acute intracranial process. Imaging results were communicated on 04/17/2023 at 1:52 am to provider WHITE via secure text paging. Electronically Signed   By: Wiliam Ke M.D.   On: 04/17/2023 01:53   DG Chest Port 1 View  Result Date: 04/17/2023 CLINICAL DATA:  Fall, chest trauma EXAM: PORTABLE CHEST 1 VIEW COMPARISON:  None Available. FINDINGS: Left costophrenic angle excluded from view. Endotracheal tube 3.2 cm above the carina. Nasogastric tube tip within the gastric fundus. Visualized lungs are  clear. No pneumothorax. No pleural effusion. Cardiac size within normal limits. Pulmonary vascularity is normal. Healed right rib fractures noted. No acute bone abnormality. IMPRESSION: 1. Endotracheal tube 3.2 cm above the carina. Nasogastric tube tip within the gastric fundus. Electronically Signed   By: Helyn Numbers M.D.   On: 04/17/2023 01:25   DG Pelvis Portable  Result Date: 04/17/2023 CLINICAL DATA:  Fall, pelvic trauma EXAM: PORTABLE PELVIS 1-2 VIEWS COMPARISON:  None Available. 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 No acute fracture or dislocation. Bilateral common iliac artery stents noted. Vascular calcifications noted. IMPRESSION: 1. Bilateral total hip arthroplasty. No acute fracture or dislocation. Electronically Signed   By: Helyn Numbers M.D.   On: 04/17/2023 01:23       Discharge Exam: Vitals:   04/29/23 0305 04/29/23 0903  BP: (!) 162/86 (!) 151/90  Pulse: 90 84  Resp: 18 18  Temp: 98 F (36.7 C) 97.8 F (36.6 C)  SpO2: 99% 100%    General: Pt is alert, awake, not in acute distress Cardiovascular: RRR, S1/S2 +, no edema Respiratory: CTA bilaterally, no wheezing, no rhonchi, no respiratory distress, no conversational  dyspnea  Abdominal: Soft, NT, ND, bowel sounds + Extremities: no edema, no cyanosis Psych: Normal mood and affect, stable judgement and insight     The results of significant diagnostics from this hospitalization (including imaging, microbiology, ancillary and laboratory) are listed below for reference.     Microbiology: Recent Results (from the past 240 hour(s))  Urine Culture     Status: None   Collection Time: 04/23/23  1:30 PM   Specimen: Urine, Catheterized  Result Value Ref Range Status   Specimen Description URINE, CATHETERIZED  Final   Special Requests NONE  Final   Culture   Final    NO GROWTH Performed at [facility], [address], [place], [place] [address]    Report Status 04/24/2023 FINAL  Final      Labs: BNP (last 3 results) Recent Labs    04/17/23 0415  BNP 197.4*   Basic Metabolic Panel: Recent Labs  Lab 04/23/23 0425 04/24/23 0655 04/25/23 0331 04/26/23 0217 04/27/23 0357 04/28/23 0654 04/29/23 0256  NA 140 140 137 133* 139 136 134*  K 3.0* 3.9 3.7 3.6 3.7 4.0 4.0  CL 101 106 105 107 106 107 101  CO2 23 22 21* 20* 20* 20* 20*  GLUCOSE 142* 87 97 103* 94 93 111*  BUN 69* 59* 55* 48* 45* 45* 50*  CREATININE 4.29* 3.82* 3.51* 3.14* 2.92* 2.86* 2.93*  CALCIUM 8.1* 8.4* 8.3* 8.2* 8.6* 8.5* 8.6*  MG 2.2  --   --   --   --   --   --   PHOS 2.7 3.4 4.0 3.9 5.0* 4.7*  --    Liver Function Tests: Recent Labs  Lab 04/24/23 0655 04/25/23 0331 04/26/23 0217 04/27/23 0357 04/28/23 0654  ALBUMIN 2.1* 2.0* 2.3* 2.4* 2.4*   No results for input(s): "LIPASE", "AMYLASE" in the last 168 hours. No results for input(s): "AMMONIA" in the last 168 hours. CBC: Recent Labs  Lab 04/29/23 0256  WBC 5.3  HGB 9.6*  HCT 29.8*  MCV 91.1  PLT 385   Cardiac Enzymes: No results for input(s): "CKTOTAL", "CKMB", "CKMBINDEX", "TROPONINI" in the last 168 hours. BNP: Invalid input(s): "POCBNP" CBG: Recent Labs  Lab 04/27/23 0006 04/27/23 0415 04/27/23 0820 04/27/23 1118 04/28/23 0736  GLUCAP 98 76 99 142* 97   D-Dimer No results for input(s): "DDIMER" in the last 72 hours. Hgb A1c No results for input(s): "HGBA1C" in the last 72 hours. Lipid Profile No results for input(s): "CHOL", "HDL", "LDLCALC", "TRIG", "CHOLHDL", "LDLDIRECT" in the last 72 hours. Thyroid function studies No results for input(s): "TSH", "T4TOTAL", "T3FREE", "THYROIDAB" in the last 72 hours.  Invalid input(s): "FREET3" Anemia work up No results for input(s): "VITAMINB12", "FOLATE", "FERRITIN", "TIBC", "IRON", "RETICCTPCT" in the last 72 hours. Urinalysis    Component Value Date/Time   COLORURINE YELLOW 04/23/2023 1330   APPEARANCEUR CLEAR 04/23/2023 1330   LABSPEC 1.010 04/23/2023 1330    PHURINE 8.0 04/23/2023 1330   GLUCOSEU NEGATIVE 04/23/2023 1330   HGBUR SMALL (A) 04/23/2023 1330   BILIRUBINUR NEGATIVE 04/23/2023 1330   KETONESUR NEGATIVE 04/23/2023 1330   PROTEINUR 100 (A) 04/23/2023 1330   NITRITE NEGATIVE 04/23/2023 1330   LEUKOCYTESUR NEGATIVE 04/23/2023 1330   Sepsis Labs Recent Labs  Lab 04/29/23 0256  WBC 5.3   Microbiology Recent Results (from the past 240 hour(s))  Urine Culture     Status: None   Collection Time: 04/23/23  1:30 PM   Specimen: Urine, Catheterized  Result Value Ref  Range Status   Specimen Description URINE, CATHETERIZED  Final   Special Requests NONE  Final   Culture   Final    NO GROWTH Performed at [facility], [address] [place], [place] [address]    Report Status 04/24/2023 FINAL  Final     Patient was seen and examined on the day of discharge and was found to be in stable condition. Time coordinating discharge: 40 minutes including assessment and coordination of care, as well as examination of the patient.   SIGNED:  Noralee Stain, DO [facility] 04/29/2023, 1:46 PM

## 2023-04-29 NOTE — TOC Transition Note (Signed)
Transition of Care [facility]) - CM/SW Discharge Note   Patient Details  Name: Chad Winters MRN: [id number] Date of Birth: [birth year]  Transition of Care [facility]) CM/SW Contact:  Eduard Roux, LCSW Phone Number: 04/29/2023, 2:35 PM   Clinical Narrative:     Patient will Discharge NW:[id number] Discharge Date:04/29/2023 Family Notified: daughter Transport By: Sharin Mons  Per MD patient is ready for discharge. RN, patient, and facility notified of discharge. Discharge Summary sent to facility. RN given number for [phone]. Ambulance transport requested for patient.   Clinical Social Worker signing off.  Antony Blackbird, MSW, LCSW Clinical Social Worker     Final next level of care: Skilled Nursing Facility Barriers to Discharge: Barriers Resolved   Patient Goals and CMS Choice      Discharge Placement                         Discharge Plan and Services Additional resources added to the After Visit Summary for                                       Social Determinants of Health (SDOH) Interventions     Readmission Risk Interventions     No data to display

## 2023-04-29 NOTE — Care Management Important Message (Signed)
Important Message  Patient Details  Name: Chad Winters MRN: [id number] Date of Birth: 1946-04-27   Medicare Important Message Given:  Yes     Sherilyn Banker 04/29/2023, 2:06 PM

## 2023-04-30 ENCOUNTER — Encounter: Payer: Self-pay | Admitting: Orthopedic Surgery

## 2023-05-03 DIAGNOSIS — K769 Liver disease, unspecified: Secondary | ICD-10-CM | POA: Diagnosis not present

## 2023-05-03 DIAGNOSIS — D62 Acute posthemorrhagic anemia: Secondary | ICD-10-CM | POA: Diagnosis not present

## 2023-05-03 DIAGNOSIS — W19XXXA Unspecified fall, initial encounter: Secondary | ICD-10-CM | POA: Diagnosis not present

## 2023-05-03 DIAGNOSIS — J9601 Acute respiratory failure with hypoxia: Secondary | ICD-10-CM | POA: Diagnosis not present

## 2023-05-03 DIAGNOSIS — S22019A Unspecified fracture of first thoracic vertebra, initial encounter for closed fracture: Secondary | ICD-10-CM | POA: Diagnosis not present

## 2023-05-03 DIAGNOSIS — I6203 Nontraumatic chronic subdural hemorrhage: Secondary | ICD-10-CM | POA: Diagnosis not present

## 2023-05-03 DIAGNOSIS — E782 Mixed hyperlipidemia: Secondary | ICD-10-CM | POA: Diagnosis not present

## 2023-05-03 DIAGNOSIS — M069 Rheumatoid arthritis, unspecified: Secondary | ICD-10-CM | POA: Diagnosis not present

## 2023-05-03 DIAGNOSIS — Z8679 Personal history of other diseases of the circulatory system: Secondary | ICD-10-CM | POA: Diagnosis not present

## 2023-05-03 DIAGNOSIS — S12600A Unspecified displaced fracture of seventh cervical vertebra, initial encounter for closed fracture: Secondary | ICD-10-CM | POA: Diagnosis not present

## 2023-05-06 DIAGNOSIS — J9601 Acute respiratory failure with hypoxia: Secondary | ICD-10-CM | POA: Diagnosis not present

## 2023-05-06 DIAGNOSIS — S22019A Unspecified fracture of first thoracic vertebra, initial encounter for closed fracture: Secondary | ICD-10-CM | POA: Diagnosis not present

## 2023-05-06 DIAGNOSIS — I6203 Nontraumatic chronic subdural hemorrhage: Secondary | ICD-10-CM | POA: Diagnosis not present

## 2023-05-06 DIAGNOSIS — E538 Deficiency of other specified B group vitamins: Secondary | ICD-10-CM | POA: Diagnosis not present

## 2023-05-06 DIAGNOSIS — E782 Mixed hyperlipidemia: Secondary | ICD-10-CM | POA: Diagnosis not present

## 2023-05-06 DIAGNOSIS — K219 Gastro-esophageal reflux disease without esophagitis: Secondary | ICD-10-CM | POA: Diagnosis not present

## 2023-05-06 DIAGNOSIS — S12600A Unspecified displaced fracture of seventh cervical vertebra, initial encounter for closed fracture: Secondary | ICD-10-CM | POA: Diagnosis not present

## 2023-05-06 DIAGNOSIS — I10 Essential (primary) hypertension: Secondary | ICD-10-CM | POA: Diagnosis not present

## 2023-05-06 DIAGNOSIS — G43909 Migraine, unspecified, not intractable, without status migrainosus: Secondary | ICD-10-CM | POA: Diagnosis not present

## 2023-05-07 DIAGNOSIS — S12601D Unspecified nondisplaced fracture of seventh cervical vertebra, subsequent encounter for fracture with routine healing: Secondary | ICD-10-CM | POA: Diagnosis not present

## 2023-05-07 DIAGNOSIS — S22019A Unspecified fracture of first thoracic vertebra, initial encounter for closed fracture: Secondary | ICD-10-CM | POA: Diagnosis not present

## 2023-05-07 DIAGNOSIS — J9601 Acute respiratory failure with hypoxia: Secondary | ICD-10-CM | POA: Diagnosis not present

## 2023-05-07 DIAGNOSIS — M109 Gout, unspecified: Secondary | ICD-10-CM | POA: Diagnosis not present

## 2023-05-07 DIAGNOSIS — S12600A Unspecified displaced fracture of seventh cervical vertebra, initial encounter for closed fracture: Secondary | ICD-10-CM | POA: Diagnosis not present

## 2023-05-07 DIAGNOSIS — W19XXXA Unspecified fall, initial encounter: Secondary | ICD-10-CM | POA: Diagnosis not present

## 2023-05-07 DIAGNOSIS — K769 Liver disease, unspecified: Secondary | ICD-10-CM | POA: Diagnosis not present

## 2023-05-07 DIAGNOSIS — D62 Acute posthemorrhagic anemia: Secondary | ICD-10-CM | POA: Diagnosis not present

## 2023-05-07 DIAGNOSIS — E782 Mixed hyperlipidemia: Secondary | ICD-10-CM | POA: Diagnosis not present

## 2023-05-09 DIAGNOSIS — I471 Supraventricular tachycardia, unspecified: Secondary | ICD-10-CM | POA: Diagnosis not present

## 2023-05-10 DIAGNOSIS — S12600A Unspecified displaced fracture of seventh cervical vertebra, initial encounter for closed fracture: Secondary | ICD-10-CM | POA: Diagnosis not present

## 2023-05-10 DIAGNOSIS — M109 Gout, unspecified: Secondary | ICD-10-CM | POA: Diagnosis not present

## 2023-05-10 DIAGNOSIS — E782 Mixed hyperlipidemia: Secondary | ICD-10-CM | POA: Diagnosis not present

## 2023-05-10 DIAGNOSIS — S12601D Unspecified nondisplaced fracture of seventh cervical vertebra, subsequent encounter for fracture with routine healing: Secondary | ICD-10-CM | POA: Diagnosis not present

## 2023-05-10 DIAGNOSIS — W19XXXA Unspecified fall, initial encounter: Secondary | ICD-10-CM | POA: Diagnosis not present

## 2023-05-10 DIAGNOSIS — S22019A Unspecified fracture of first thoracic vertebra, initial encounter for closed fracture: Secondary | ICD-10-CM | POA: Diagnosis not present

## 2023-05-10 DIAGNOSIS — J9601 Acute respiratory failure with hypoxia: Secondary | ICD-10-CM | POA: Diagnosis not present

## 2023-05-10 DIAGNOSIS — D62 Acute posthemorrhagic anemia: Secondary | ICD-10-CM | POA: Diagnosis not present

## 2023-05-10 DIAGNOSIS — K769 Liver disease, unspecified: Secondary | ICD-10-CM | POA: Diagnosis not present

## 2023-05-11 DIAGNOSIS — W19XXXA Unspecified fall, initial encounter: Secondary | ICD-10-CM | POA: Diagnosis not present

## 2023-05-11 DIAGNOSIS — S22019A Unspecified fracture of first thoracic vertebra, initial encounter for closed fracture: Secondary | ICD-10-CM | POA: Diagnosis not present

## 2023-05-11 DIAGNOSIS — D62 Acute posthemorrhagic anemia: Secondary | ICD-10-CM | POA: Diagnosis not present

## 2023-05-11 DIAGNOSIS — S12600A Unspecified displaced fracture of seventh cervical vertebra, initial encounter for closed fracture: Secondary | ICD-10-CM | POA: Diagnosis not present

## 2023-05-11 DIAGNOSIS — S12601D Unspecified nondisplaced fracture of seventh cervical vertebra, subsequent encounter for fracture with routine healing: Secondary | ICD-10-CM | POA: Diagnosis not present

## 2023-05-11 DIAGNOSIS — E782 Mixed hyperlipidemia: Secondary | ICD-10-CM | POA: Diagnosis not present

## 2023-05-11 DIAGNOSIS — J9601 Acute respiratory failure with hypoxia: Secondary | ICD-10-CM | POA: Diagnosis not present

## 2023-05-11 DIAGNOSIS — M109 Gout, unspecified: Secondary | ICD-10-CM | POA: Diagnosis not present

## 2023-05-11 DIAGNOSIS — K769 Liver disease, unspecified: Secondary | ICD-10-CM | POA: Diagnosis not present

## 2023-05-18 DIAGNOSIS — K7689 Other specified diseases of liver: Secondary | ICD-10-CM | POA: Diagnosis not present

## 2023-05-18 DIAGNOSIS — S12601D Unspecified nondisplaced fracture of seventh cervical vertebra, subsequent encounter for fracture with routine healing: Secondary | ICD-10-CM | POA: Diagnosis not present

## 2023-05-18 DIAGNOSIS — R6889 Other general symptoms and signs: Secondary | ICD-10-CM | POA: Diagnosis not present

## 2023-05-18 DIAGNOSIS — N189 Chronic kidney disease, unspecified: Secondary | ICD-10-CM | POA: Diagnosis not present

## 2023-05-24 DIAGNOSIS — R6889 Other general symptoms and signs: Secondary | ICD-10-CM | POA: Diagnosis not present

## 2023-05-25 DIAGNOSIS — Z0100 Encounter for examination of eyes and vision without abnormal findings: Secondary | ICD-10-CM | POA: Diagnosis not present

## 2023-05-31 DIAGNOSIS — M459 Ankylosing spondylitis of unspecified sites in spine: Secondary | ICD-10-CM | POA: Diagnosis not present

## 2023-05-31 DIAGNOSIS — S22019D Unspecified fracture of first thoracic vertebra, subsequent encounter for fracture with routine healing: Secondary | ICD-10-CM | POA: Diagnosis not present

## 2023-05-31 DIAGNOSIS — M1A10X0 Lead-induced chronic gout, unspecified site, without tophus (tophi): Secondary | ICD-10-CM | POA: Diagnosis not present

## 2023-05-31 DIAGNOSIS — N183 Chronic kidney disease, stage 3 unspecified: Secondary | ICD-10-CM | POA: Diagnosis not present

## 2023-05-31 DIAGNOSIS — S12601D Unspecified nondisplaced fracture of seventh cervical vertebra, subsequent encounter for fracture with routine healing: Secondary | ICD-10-CM | POA: Diagnosis not present

## 2023-05-31 DIAGNOSIS — G251 Drug-induced tremor: Secondary | ICD-10-CM | POA: Diagnosis not present

## 2023-05-31 DIAGNOSIS — I472 Ventricular tachycardia, unspecified: Secondary | ICD-10-CM | POA: Diagnosis not present

## 2023-05-31 DIAGNOSIS — I129 Hypertensive chronic kidney disease with stage 1 through stage 4 chronic kidney disease, or unspecified chronic kidney disease: Secondary | ICD-10-CM | POA: Diagnosis not present

## 2023-05-31 DIAGNOSIS — G43909 Migraine, unspecified, not intractable, without status migrainosus: Secondary | ICD-10-CM | POA: Diagnosis not present

## 2023-06-04 DIAGNOSIS — N183 Chronic kidney disease, stage 3 unspecified: Secondary | ICD-10-CM | POA: Diagnosis not present

## 2023-06-04 DIAGNOSIS — S12601D Unspecified nondisplaced fracture of seventh cervical vertebra, subsequent encounter for fracture with routine healing: Secondary | ICD-10-CM | POA: Diagnosis not present

## 2023-06-04 DIAGNOSIS — S22019D Unspecified fracture of first thoracic vertebra, subsequent encounter for fracture with routine healing: Secondary | ICD-10-CM | POA: Diagnosis not present

## 2023-06-04 DIAGNOSIS — I129 Hypertensive chronic kidney disease with stage 1 through stage 4 chronic kidney disease, or unspecified chronic kidney disease: Secondary | ICD-10-CM | POA: Diagnosis not present

## 2023-06-04 DIAGNOSIS — M459 Ankylosing spondylitis of unspecified sites in spine: Secondary | ICD-10-CM | POA: Diagnosis not present

## 2023-06-04 DIAGNOSIS — G43909 Migraine, unspecified, not intractable, without status migrainosus: Secondary | ICD-10-CM | POA: Diagnosis not present

## 2023-06-04 DIAGNOSIS — G251 Drug-induced tremor: Secondary | ICD-10-CM | POA: Diagnosis not present

## 2023-06-04 DIAGNOSIS — M1A10X0 Lead-induced chronic gout, unspecified site, without tophus (tophi): Secondary | ICD-10-CM | POA: Diagnosis not present

## 2023-06-04 DIAGNOSIS — I472 Ventricular tachycardia, unspecified: Secondary | ICD-10-CM | POA: Diagnosis not present

## 2023-06-07 DIAGNOSIS — N183 Chronic kidney disease, stage 3 unspecified: Secondary | ICD-10-CM | POA: Diagnosis not present

## 2023-06-07 DIAGNOSIS — S22019D Unspecified fracture of first thoracic vertebra, subsequent encounter for fracture with routine healing: Secondary | ICD-10-CM | POA: Diagnosis not present

## 2023-06-07 DIAGNOSIS — I472 Ventricular tachycardia, unspecified: Secondary | ICD-10-CM | POA: Diagnosis not present

## 2023-06-07 DIAGNOSIS — M1A10X0 Lead-induced chronic gout, unspecified site, without tophus (tophi): Secondary | ICD-10-CM | POA: Diagnosis not present

## 2023-06-07 DIAGNOSIS — G43909 Migraine, unspecified, not intractable, without status migrainosus: Secondary | ICD-10-CM | POA: Diagnosis not present

## 2023-06-07 DIAGNOSIS — I129 Hypertensive chronic kidney disease with stage 1 through stage 4 chronic kidney disease, or unspecified chronic kidney disease: Secondary | ICD-10-CM | POA: Diagnosis not present

## 2023-06-07 DIAGNOSIS — S12601D Unspecified nondisplaced fracture of seventh cervical vertebra, subsequent encounter for fracture with routine healing: Secondary | ICD-10-CM | POA: Diagnosis not present

## 2023-06-07 DIAGNOSIS — G251 Drug-induced tremor: Secondary | ICD-10-CM | POA: Diagnosis not present

## 2023-06-07 DIAGNOSIS — M459 Ankylosing spondylitis of unspecified sites in spine: Secondary | ICD-10-CM | POA: Diagnosis not present

## 2023-06-09 DIAGNOSIS — G251 Drug-induced tremor: Secondary | ICD-10-CM | POA: Diagnosis not present

## 2023-06-09 DIAGNOSIS — N183 Chronic kidney disease, stage 3 unspecified: Secondary | ICD-10-CM | POA: Diagnosis not present

## 2023-06-09 DIAGNOSIS — M459 Ankylosing spondylitis of unspecified sites in spine: Secondary | ICD-10-CM | POA: Diagnosis not present

## 2023-06-09 DIAGNOSIS — G43909 Migraine, unspecified, not intractable, without status migrainosus: Secondary | ICD-10-CM | POA: Diagnosis not present

## 2023-06-09 DIAGNOSIS — S12601D Unspecified nondisplaced fracture of seventh cervical vertebra, subsequent encounter for fracture with routine healing: Secondary | ICD-10-CM | POA: Diagnosis not present

## 2023-06-09 DIAGNOSIS — I129 Hypertensive chronic kidney disease with stage 1 through stage 4 chronic kidney disease, or unspecified chronic kidney disease: Secondary | ICD-10-CM | POA: Diagnosis not present

## 2023-06-09 DIAGNOSIS — I472 Ventricular tachycardia, unspecified: Secondary | ICD-10-CM | POA: Diagnosis not present

## 2023-06-09 DIAGNOSIS — S22019D Unspecified fracture of first thoracic vertebra, subsequent encounter for fracture with routine healing: Secondary | ICD-10-CM | POA: Diagnosis not present

## 2023-06-09 DIAGNOSIS — M1A10X0 Lead-induced chronic gout, unspecified site, without tophus (tophi): Secondary | ICD-10-CM | POA: Diagnosis not present

## 2023-06-14 DIAGNOSIS — S22019D Unspecified fracture of first thoracic vertebra, subsequent encounter for fracture with routine healing: Secondary | ICD-10-CM | POA: Diagnosis not present

## 2023-06-14 DIAGNOSIS — I472 Ventricular tachycardia, unspecified: Secondary | ICD-10-CM | POA: Diagnosis not present

## 2023-06-14 DIAGNOSIS — I129 Hypertensive chronic kidney disease with stage 1 through stage 4 chronic kidney disease, or unspecified chronic kidney disease: Secondary | ICD-10-CM | POA: Diagnosis not present

## 2023-06-14 DIAGNOSIS — G43909 Migraine, unspecified, not intractable, without status migrainosus: Secondary | ICD-10-CM | POA: Diagnosis not present

## 2023-06-14 DIAGNOSIS — M1A10X0 Lead-induced chronic gout, unspecified site, without tophus (tophi): Secondary | ICD-10-CM | POA: Diagnosis not present

## 2023-06-14 DIAGNOSIS — M459 Ankylosing spondylitis of unspecified sites in spine: Secondary | ICD-10-CM | POA: Diagnosis not present

## 2023-06-14 DIAGNOSIS — G251 Drug-induced tremor: Secondary | ICD-10-CM | POA: Diagnosis not present

## 2023-06-14 DIAGNOSIS — N183 Chronic kidney disease, stage 3 unspecified: Secondary | ICD-10-CM | POA: Diagnosis not present

## 2023-06-14 DIAGNOSIS — S12601D Unspecified nondisplaced fracture of seventh cervical vertebra, subsequent encounter for fracture with routine healing: Secondary | ICD-10-CM | POA: Diagnosis not present

## 2023-06-22 DIAGNOSIS — G43909 Migraine, unspecified, not intractable, without status migrainosus: Secondary | ICD-10-CM | POA: Diagnosis not present

## 2023-06-22 DIAGNOSIS — M459 Ankylosing spondylitis of unspecified sites in spine: Secondary | ICD-10-CM | POA: Diagnosis not present

## 2023-06-22 DIAGNOSIS — I129 Hypertensive chronic kidney disease with stage 1 through stage 4 chronic kidney disease, or unspecified chronic kidney disease: Secondary | ICD-10-CM | POA: Diagnosis not present

## 2023-06-22 DIAGNOSIS — S12601D Unspecified nondisplaced fracture of seventh cervical vertebra, subsequent encounter for fracture with routine healing: Secondary | ICD-10-CM | POA: Diagnosis not present

## 2023-06-22 DIAGNOSIS — N183 Chronic kidney disease, stage 3 unspecified: Secondary | ICD-10-CM | POA: Diagnosis not present

## 2023-06-22 DIAGNOSIS — G251 Drug-induced tremor: Secondary | ICD-10-CM | POA: Diagnosis not present

## 2023-06-22 DIAGNOSIS — M1A10X0 Lead-induced chronic gout, unspecified site, without tophus (tophi): Secondary | ICD-10-CM | POA: Diagnosis not present

## 2023-06-22 DIAGNOSIS — I472 Ventricular tachycardia, unspecified: Secondary | ICD-10-CM | POA: Diagnosis not present

## 2023-06-22 DIAGNOSIS — S22019D Unspecified fracture of first thoracic vertebra, subsequent encounter for fracture with routine healing: Secondary | ICD-10-CM | POA: Diagnosis not present

## 2023-06-29 DIAGNOSIS — I129 Hypertensive chronic kidney disease with stage 1 through stage 4 chronic kidney disease, or unspecified chronic kidney disease: Secondary | ICD-10-CM | POA: Diagnosis not present

## 2023-06-29 DIAGNOSIS — I472 Ventricular tachycardia, unspecified: Secondary | ICD-10-CM | POA: Diagnosis not present

## 2023-06-29 DIAGNOSIS — G43909 Migraine, unspecified, not intractable, without status migrainosus: Secondary | ICD-10-CM | POA: Diagnosis not present

## 2023-06-29 DIAGNOSIS — S22019D Unspecified fracture of first thoracic vertebra, subsequent encounter for fracture with routine healing: Secondary | ICD-10-CM | POA: Diagnosis not present

## 2023-06-29 DIAGNOSIS — M1A10X0 Lead-induced chronic gout, unspecified site, without tophus (tophi): Secondary | ICD-10-CM | POA: Diagnosis not present

## 2023-06-29 DIAGNOSIS — G251 Drug-induced tremor: Secondary | ICD-10-CM | POA: Diagnosis not present

## 2023-06-29 DIAGNOSIS — M459 Ankylosing spondylitis of unspecified sites in spine: Secondary | ICD-10-CM | POA: Diagnosis not present

## 2023-06-29 DIAGNOSIS — N183 Chronic kidney disease, stage 3 unspecified: Secondary | ICD-10-CM | POA: Diagnosis not present

## 2023-06-29 DIAGNOSIS — S12601D Unspecified nondisplaced fracture of seventh cervical vertebra, subsequent encounter for fracture with routine healing: Secondary | ICD-10-CM | POA: Diagnosis not present

## 2023-06-30 DIAGNOSIS — G43909 Migraine, unspecified, not intractable, without status migrainosus: Secondary | ICD-10-CM | POA: Diagnosis not present

## 2023-06-30 DIAGNOSIS — S12601D Unspecified nondisplaced fracture of seventh cervical vertebra, subsequent encounter for fracture with routine healing: Secondary | ICD-10-CM | POA: Diagnosis not present

## 2023-06-30 DIAGNOSIS — M459 Ankylosing spondylitis of unspecified sites in spine: Secondary | ICD-10-CM | POA: Diagnosis not present

## 2023-06-30 DIAGNOSIS — I129 Hypertensive chronic kidney disease with stage 1 through stage 4 chronic kidney disease, or unspecified chronic kidney disease: Secondary | ICD-10-CM | POA: Diagnosis not present

## 2023-06-30 DIAGNOSIS — S22019D Unspecified fracture of first thoracic vertebra, subsequent encounter for fracture with routine healing: Secondary | ICD-10-CM | POA: Diagnosis not present

## 2023-06-30 DIAGNOSIS — N183 Chronic kidney disease, stage 3 unspecified: Secondary | ICD-10-CM | POA: Diagnosis not present

## 2023-06-30 DIAGNOSIS — M1A10X0 Lead-induced chronic gout, unspecified site, without tophus (tophi): Secondary | ICD-10-CM | POA: Diagnosis not present

## 2023-06-30 DIAGNOSIS — G251 Drug-induced tremor: Secondary | ICD-10-CM | POA: Diagnosis not present

## 2023-06-30 DIAGNOSIS — I472 Ventricular tachycardia, unspecified: Secondary | ICD-10-CM | POA: Diagnosis not present

## 2023-07-04 ENCOUNTER — Encounter (HOSPITAL_BASED_OUTPATIENT_CLINIC_OR_DEPARTMENT_OTHER): Payer: Self-pay | Admitting: *Deleted

## 2023-07-04 ENCOUNTER — Other Ambulatory Visit: Payer: Self-pay

## 2023-07-04 DIAGNOSIS — R42 Dizziness and giddiness: Secondary | ICD-10-CM | POA: Insufficient documentation

## 2023-07-04 DIAGNOSIS — Z5321 Procedure and treatment not carried out due to patient leaving prior to being seen by health care provider: Secondary | ICD-10-CM | POA: Insufficient documentation

## 2023-07-04 DIAGNOSIS — W1830XA Fall on same level, unspecified, initial encounter: Secondary | ICD-10-CM | POA: Insufficient documentation

## 2023-07-04 DIAGNOSIS — S301XXA Contusion of abdominal wall, initial encounter: Secondary | ICD-10-CM | POA: Insufficient documentation

## 2023-07-04 DIAGNOSIS — R222 Localized swelling, mass and lump, trunk: Secondary | ICD-10-CM | POA: Insufficient documentation

## 2023-07-04 NOTE — ED Triage Notes (Signed)
Pt fell 5 days ago, has large healing bruise noted to R side and R flank. Was not evaluated at time of fall. Has been working with physical therapy due to ongoing dizziness.  Pt has large swollen area to R upper back; pt reports increased drainage from area requiring him to frequently change his shirt. Denies fever or chills

## 2023-07-05 ENCOUNTER — Emergency Department (HOSPITAL_BASED_OUTPATIENT_CLINIC_OR_DEPARTMENT_OTHER)
Admission: EM | Admit: 2023-07-05 | Discharge: 2023-07-05 | Discharge status: Against Medical Advice | Payer: Medicare HMO | Attending: Emergency Medicine | Admitting: Emergency Medicine

## 2023-07-05 DIAGNOSIS — G43909 Migraine, unspecified, not intractable, without status migrainosus: Secondary | ICD-10-CM | POA: Diagnosis not present

## 2023-07-05 DIAGNOSIS — S22019D Unspecified fracture of first thoracic vertebra, subsequent encounter for fracture with routine healing: Secondary | ICD-10-CM | POA: Diagnosis not present

## 2023-07-05 DIAGNOSIS — M1A10X0 Lead-induced chronic gout, unspecified site, without tophus (tophi): Secondary | ICD-10-CM | POA: Diagnosis not present

## 2023-07-05 DIAGNOSIS — G251 Drug-induced tremor: Secondary | ICD-10-CM | POA: Diagnosis not present

## 2023-07-05 DIAGNOSIS — S12601D Unspecified nondisplaced fracture of seventh cervical vertebra, subsequent encounter for fracture with routine healing: Secondary | ICD-10-CM | POA: Diagnosis not present

## 2023-07-05 DIAGNOSIS — I129 Hypertensive chronic kidney disease with stage 1 through stage 4 chronic kidney disease, or unspecified chronic kidney disease: Secondary | ICD-10-CM | POA: Diagnosis not present

## 2023-07-05 DIAGNOSIS — N183 Chronic kidney disease, stage 3 unspecified: Secondary | ICD-10-CM | POA: Diagnosis not present

## 2023-07-05 DIAGNOSIS — I472 Ventricular tachycardia, unspecified: Secondary | ICD-10-CM | POA: Diagnosis not present

## 2023-07-05 DIAGNOSIS — M459 Ankylosing spondylitis of unspecified sites in spine: Secondary | ICD-10-CM | POA: Diagnosis not present

## 2023-07-05 NOTE — ED Notes (Signed)
Registration reported pt left while waiting for aroom

## 2023-07-05 NOTE — ED Notes (Signed)
Unable to locate patient in lobby or bathroom x2

## 2023-07-12 DIAGNOSIS — I472 Ventricular tachycardia, unspecified: Secondary | ICD-10-CM | POA: Diagnosis not present

## 2023-07-12 DIAGNOSIS — N183 Chronic kidney disease, stage 3 unspecified: Secondary | ICD-10-CM | POA: Diagnosis not present

## 2023-07-12 DIAGNOSIS — G251 Drug-induced tremor: Secondary | ICD-10-CM | POA: Diagnosis not present

## 2023-07-12 DIAGNOSIS — I129 Hypertensive chronic kidney disease with stage 1 through stage 4 chronic kidney disease, or unspecified chronic kidney disease: Secondary | ICD-10-CM | POA: Diagnosis not present

## 2023-07-12 DIAGNOSIS — M459 Ankylosing spondylitis of unspecified sites in spine: Secondary | ICD-10-CM | POA: Diagnosis not present

## 2023-07-12 DIAGNOSIS — S12601D Unspecified nondisplaced fracture of seventh cervical vertebra, subsequent encounter for fracture with routine healing: Secondary | ICD-10-CM | POA: Diagnosis not present

## 2023-07-12 DIAGNOSIS — S22019D Unspecified fracture of first thoracic vertebra, subsequent encounter for fracture with routine healing: Secondary | ICD-10-CM | POA: Diagnosis not present

## 2023-07-12 DIAGNOSIS — G43909 Migraine, unspecified, not intractable, without status migrainosus: Secondary | ICD-10-CM | POA: Diagnosis not present

## 2023-07-12 DIAGNOSIS — M1A10X0 Lead-induced chronic gout, unspecified site, without tophus (tophi): Secondary | ICD-10-CM | POA: Diagnosis not present

## 2023-07-19 DIAGNOSIS — G251 Drug-induced tremor: Secondary | ICD-10-CM | POA: Diagnosis not present

## 2023-07-19 DIAGNOSIS — S12601D Unspecified nondisplaced fracture of seventh cervical vertebra, subsequent encounter for fracture with routine healing: Secondary | ICD-10-CM | POA: Diagnosis not present

## 2023-07-19 DIAGNOSIS — N183 Chronic kidney disease, stage 3 unspecified: Secondary | ICD-10-CM | POA: Diagnosis not present

## 2023-07-19 DIAGNOSIS — I472 Ventricular tachycardia, unspecified: Secondary | ICD-10-CM | POA: Diagnosis not present

## 2023-07-19 DIAGNOSIS — S22019D Unspecified fracture of first thoracic vertebra, subsequent encounter for fracture with routine healing: Secondary | ICD-10-CM | POA: Diagnosis not present

## 2023-07-19 DIAGNOSIS — M459 Ankylosing spondylitis of unspecified sites in spine: Secondary | ICD-10-CM | POA: Diagnosis not present

## 2023-07-19 DIAGNOSIS — I129 Hypertensive chronic kidney disease with stage 1 through stage 4 chronic kidney disease, or unspecified chronic kidney disease: Secondary | ICD-10-CM | POA: Diagnosis not present

## 2023-07-19 DIAGNOSIS — G43909 Migraine, unspecified, not intractable, without status migrainosus: Secondary | ICD-10-CM | POA: Diagnosis not present

## 2023-07-19 DIAGNOSIS — M1A10X0 Lead-induced chronic gout, unspecified site, without tophus (tophi): Secondary | ICD-10-CM | POA: Diagnosis not present

## 2023-07-26 DIAGNOSIS — S12601D Unspecified nondisplaced fracture of seventh cervical vertebra, subsequent encounter for fracture with routine healing: Secondary | ICD-10-CM | POA: Diagnosis not present

## 2023-07-26 DIAGNOSIS — M459 Ankylosing spondylitis of unspecified sites in spine: Secondary | ICD-10-CM | POA: Diagnosis not present

## 2023-07-26 DIAGNOSIS — M1A10X0 Lead-induced chronic gout, unspecified site, without tophus (tophi): Secondary | ICD-10-CM | POA: Diagnosis not present

## 2023-07-26 DIAGNOSIS — I129 Hypertensive chronic kidney disease with stage 1 through stage 4 chronic kidney disease, or unspecified chronic kidney disease: Secondary | ICD-10-CM | POA: Diagnosis not present

## 2023-07-26 DIAGNOSIS — G43909 Migraine, unspecified, not intractable, without status migrainosus: Secondary | ICD-10-CM | POA: Diagnosis not present

## 2023-07-26 DIAGNOSIS — G251 Drug-induced tremor: Secondary | ICD-10-CM | POA: Diagnosis not present

## 2023-07-26 DIAGNOSIS — S22019D Unspecified fracture of first thoracic vertebra, subsequent encounter for fracture with routine healing: Secondary | ICD-10-CM | POA: Diagnosis not present

## 2023-07-26 DIAGNOSIS — I472 Ventricular tachycardia, unspecified: Secondary | ICD-10-CM | POA: Diagnosis not present

## 2023-07-26 DIAGNOSIS — N183 Chronic kidney disease, stage 3 unspecified: Secondary | ICD-10-CM | POA: Diagnosis not present

## 2023-12-31 IMAGING — CT CT CERVICAL SPINE W/O CM
2 of 3 series · 9 of 33 positions shown, 10 images · non-contrast
Comparison: CT head 10/03/2021.

CLINICAL DATA: Fell.  Facial bruising.



[Series 5: sag bone · sagittal · 0.23mm/px · 5 of 72 slices shown, 6 images]
[im 24/72  bone]
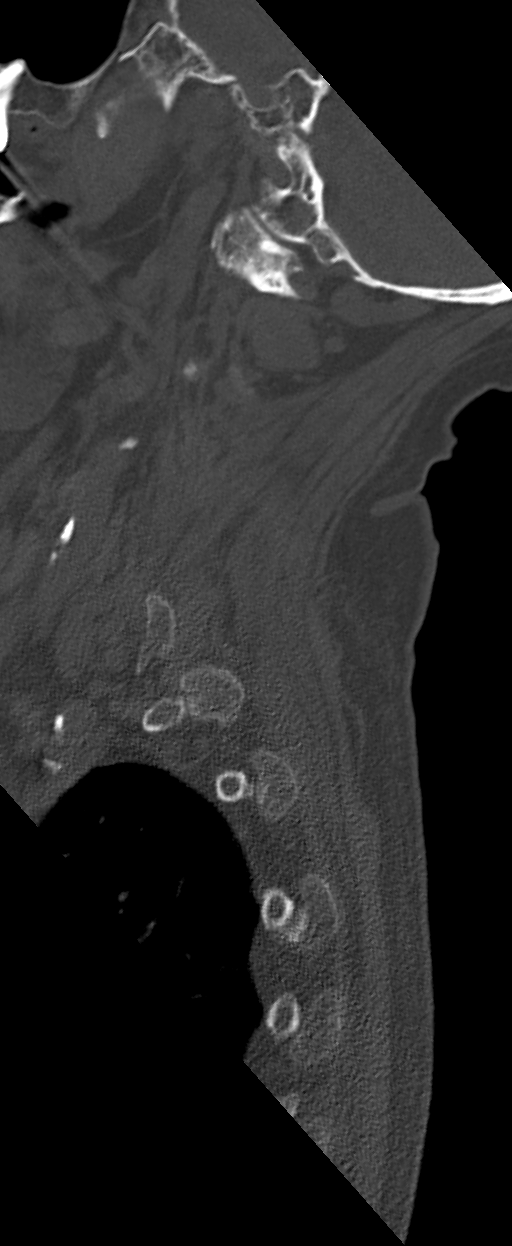
[im 30/72  bone]
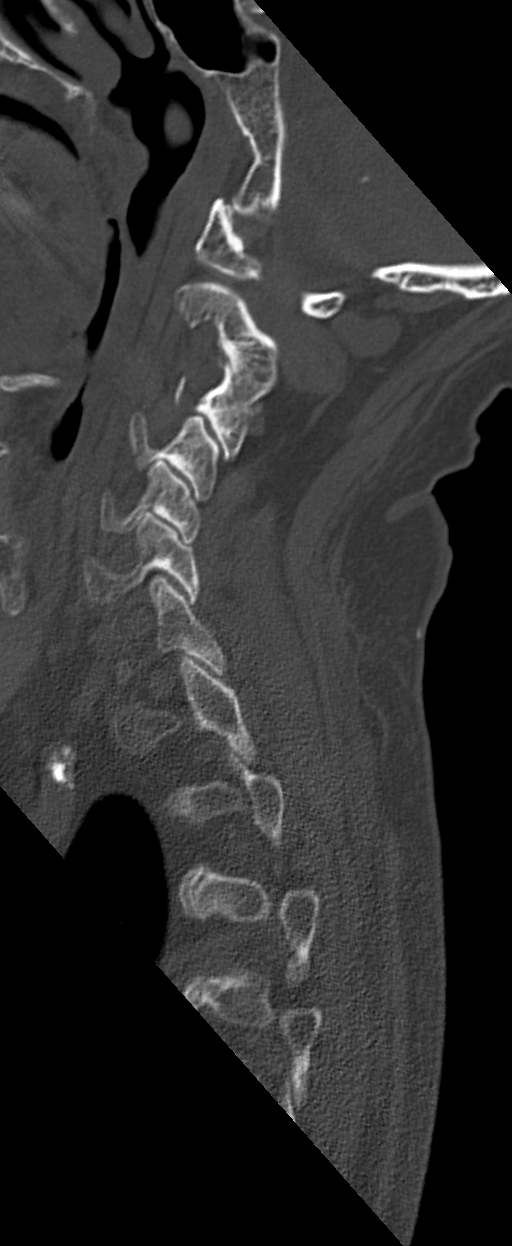
[im 36/72  soft-tissue]
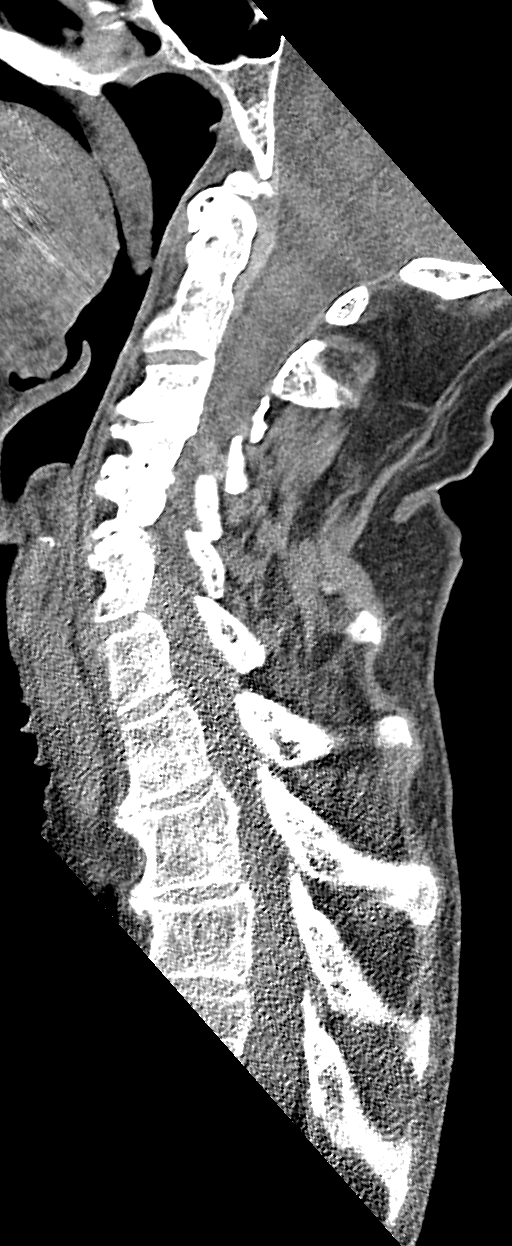
[im 36/72  bone]
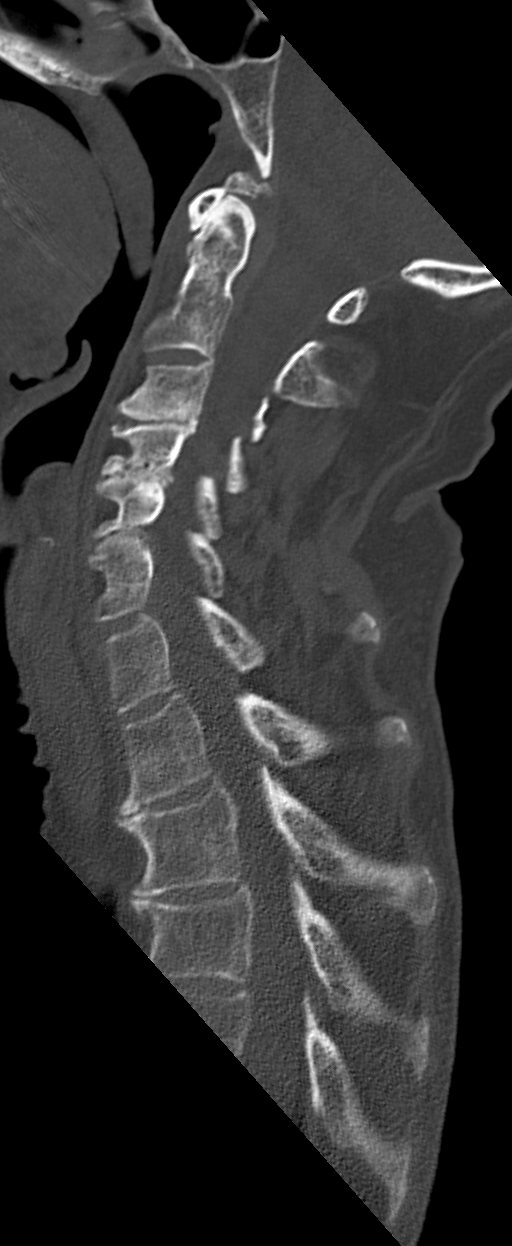
[im 42/72  bone]
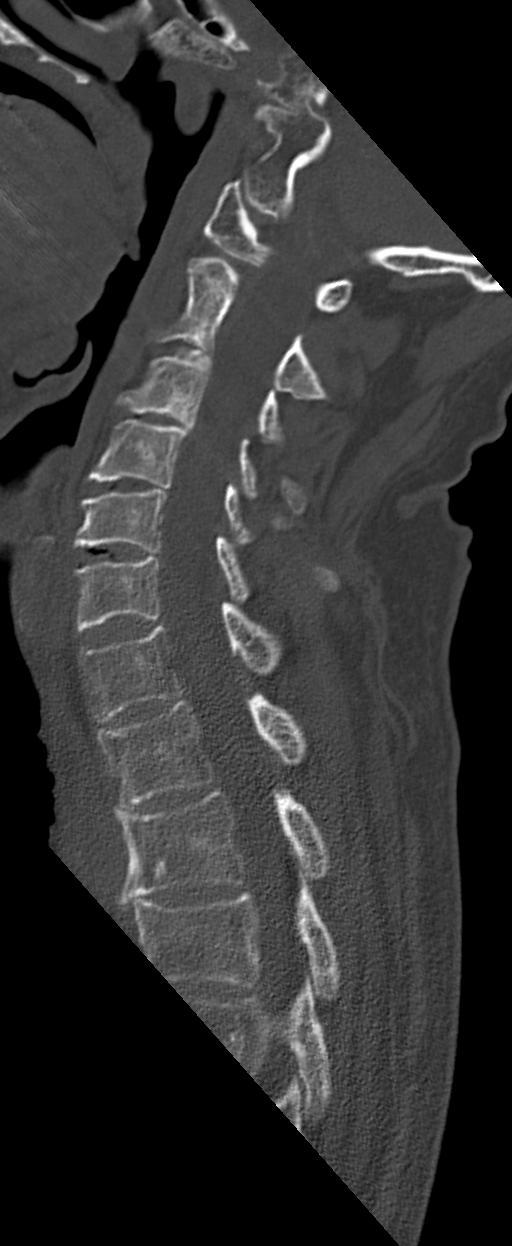
[im 48/72  bone]
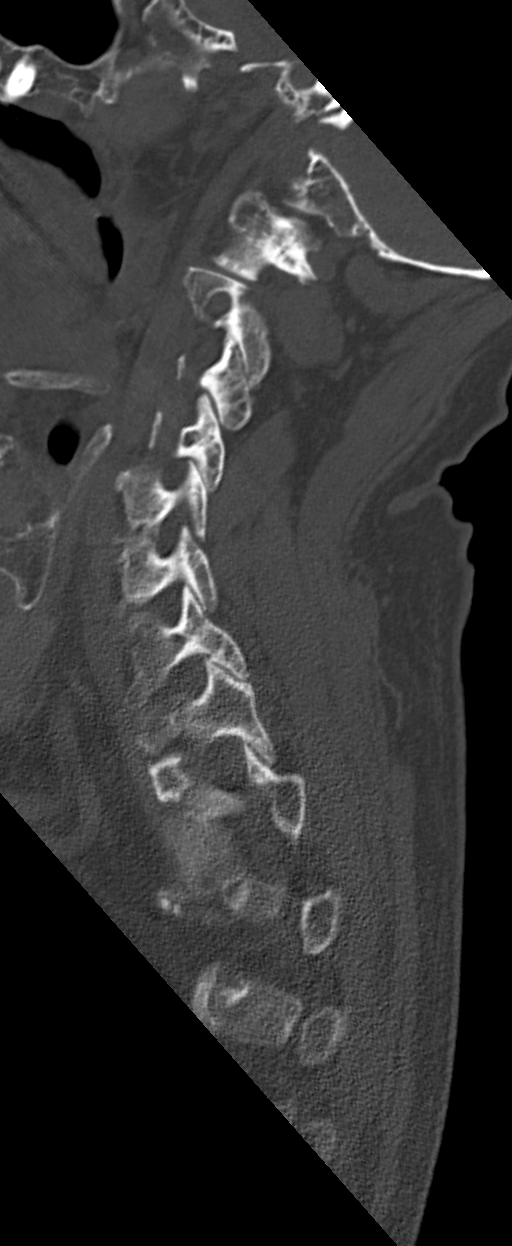

[Series 6: orthogonal axials · axial · 0.21mm/px · z∈[-293,-244]mm · 4 of 106 slices shown]
[im 52/106  bone]
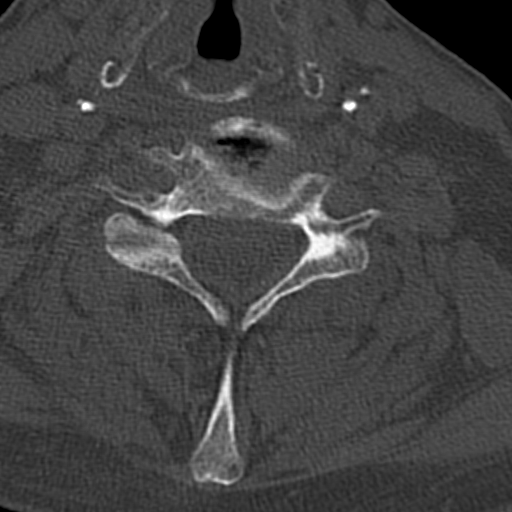
[im 62/106  bone]
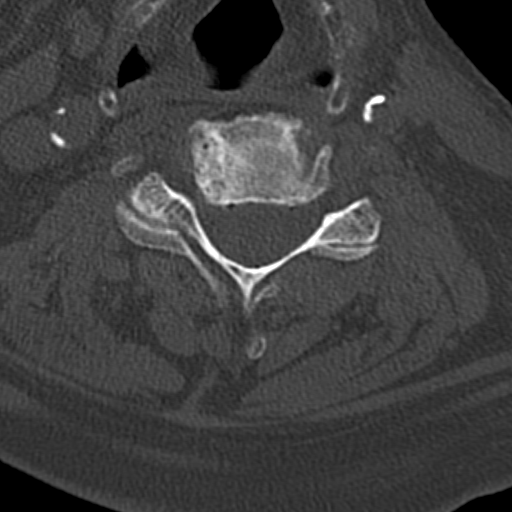
[im 67/106  bone]
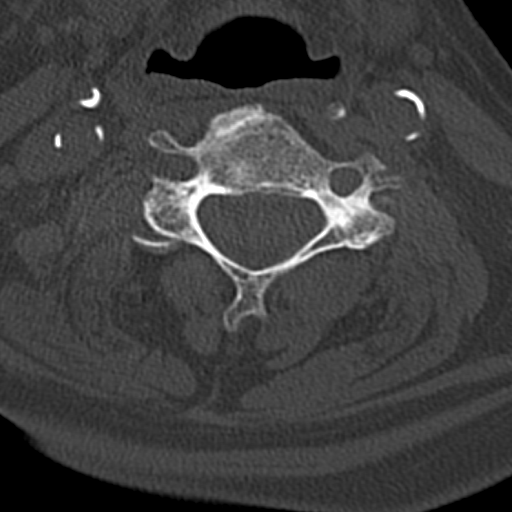
[im 77/106  bone]
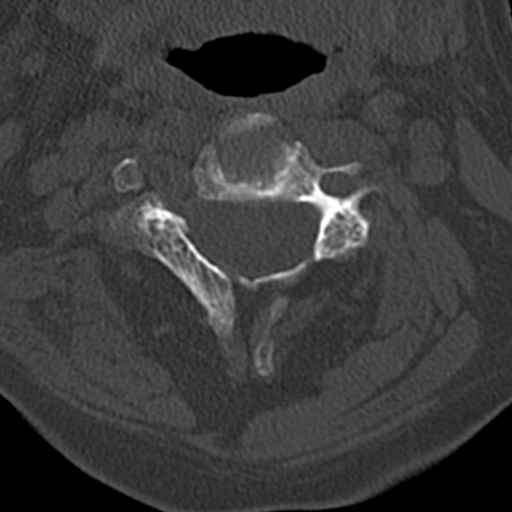

[9 of 33 positions shown; findings below may reference images not displayed]

FINDINGS: CT HEAD FINDINGS

Brain: No evidence of acute infarction, hemorrhage, hydrocephalus,
extra-axial collection or mass lesion/mass effect. Low-density
extra-axial fluid collection in the right frontal region measuring 1
cm appears unchanged from prior. Small area of encephalomalacia in
the right frontal lobe is also unchanged. There is mild diffuse
atrophy.

Vascular: Atherosclerotic calcifications are present within the
cavernous internal carotid arteries.

Skull: Old bilateral frontoparietal burr holes are present. No acute
fractures are seen.

Other: None.

CT MAXILLOFACIAL FINDINGS

Osseous: No fracture or mandibular dislocation. No destructive
process.

Orbits: Negative. No traumatic or inflammatory finding.

Sinuses: Right mastoidectomy changes are again noted. There is
opacification residual right mastoid air cells, unchanged. Left
mastoid air cells clear. Paranasal sinuses are clear.

Soft tissues: There is mild soft tissue swelling and edema of the
right face. There is also soft tissue swelling and edema in the
right forehead and right periorbital region. There is no radiopaque
foreign body.

CT CERVICAL SPINE FINDINGS

Alignment: Normal.

Skull base and vertebrae: No acute fracture. No primary bone lesion
or focal pathologic process.

Soft tissues and spinal canal: No prevertebral fluid or swelling. No
visible canal hematoma.

Disc levels: There is disc space narrowing from C3 through C7
compatible with degenerative change. There is no severe central
canal or neural foraminal stenosis identified.

Upper chest: Negative.

Other: None.
IMPRESSION: 1. No acute intracranial process.
2. No acute facial fracture.
3. Right facial soft tissue swelling and edema.
4. No acute fracture or malalignment of the cervical spine.
5. Unchanged small low-density extra-axial fluid collection
overlying the right cerebral convexity may represent subdural
hygroma.

## 2023-12-31 IMAGING — CT CT HEAD W/O CM
3 of 4 series · 13 of 47 positions shown, 15 images · non-contrast
Comparison: CT head 10/03/2021.

CLINICAL DATA: Fell.  Facial bruising.



[Series 4: head wo ax · axial · 0.31mm/px · z∈[-175,-57]mm · 7 of 33 slices shown, 9 images]
[im 5/33  brain]
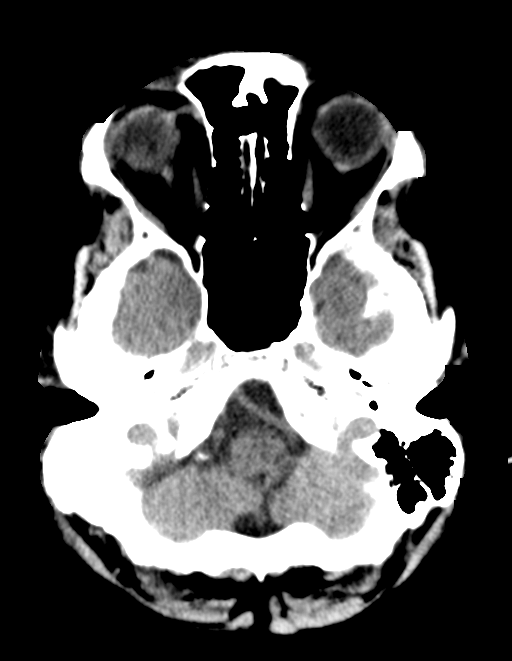
[im 5/33  bone]
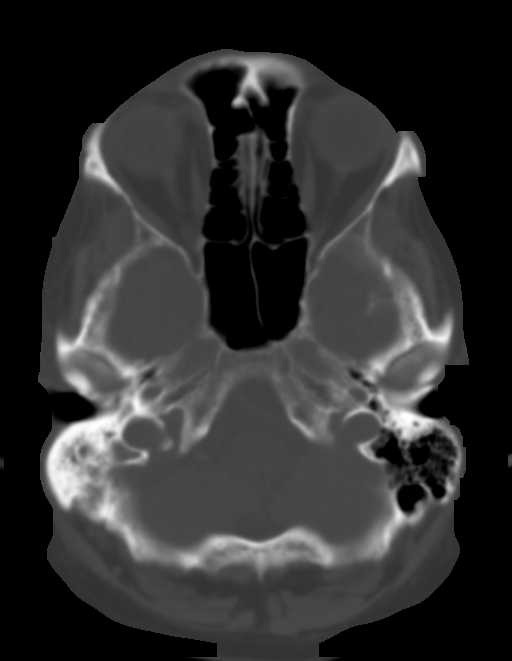
[im 9/33  brain]
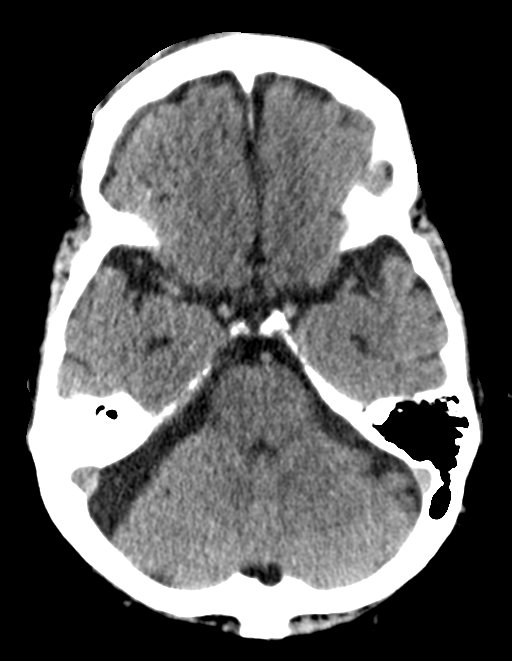
[im 13/33  brain]
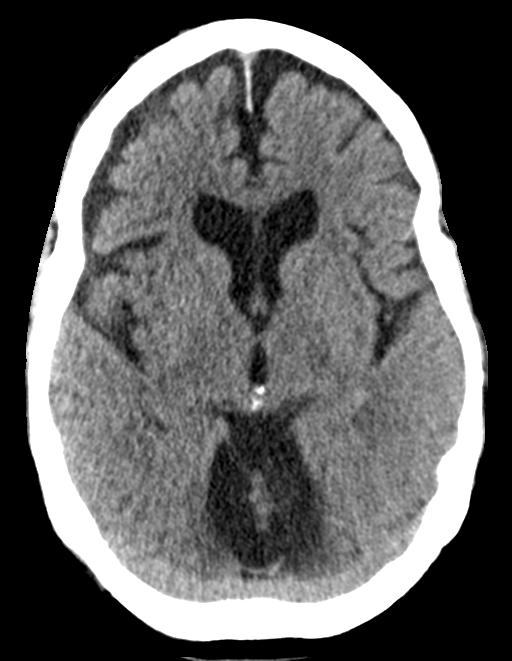
[im 17/33  brain]
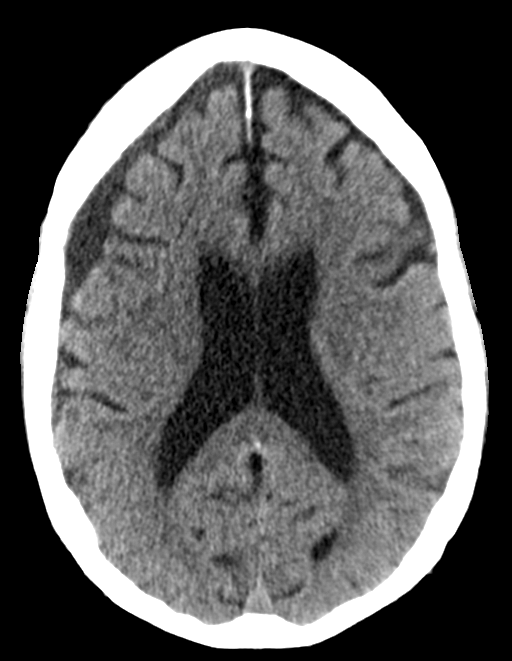
[im 21/33  brain]
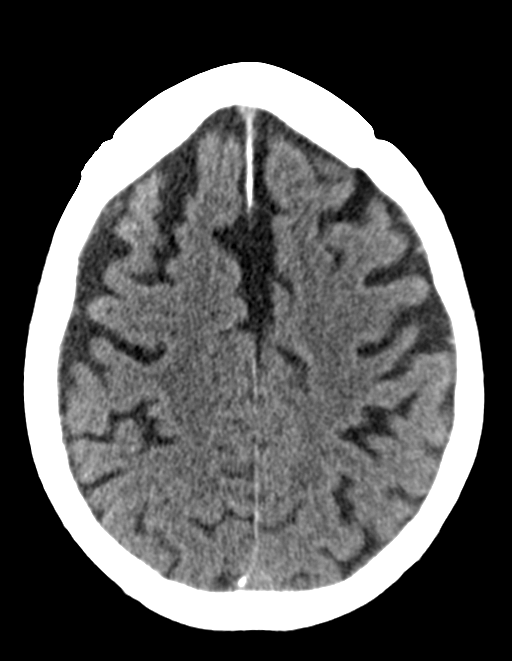
[im 21/33  bone]
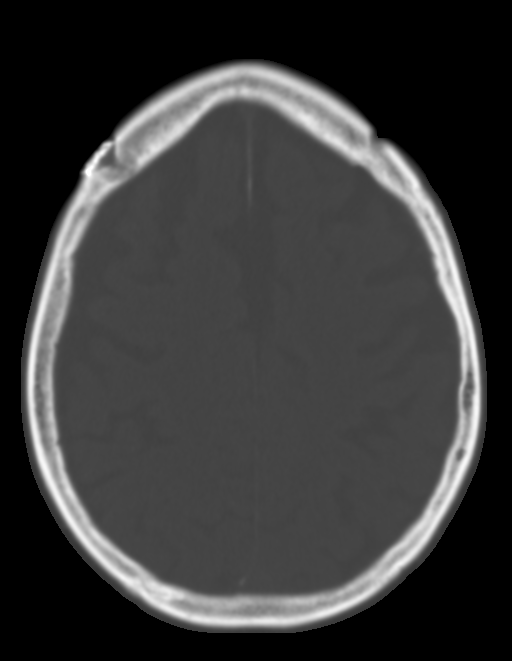
[im 25/33  brain]
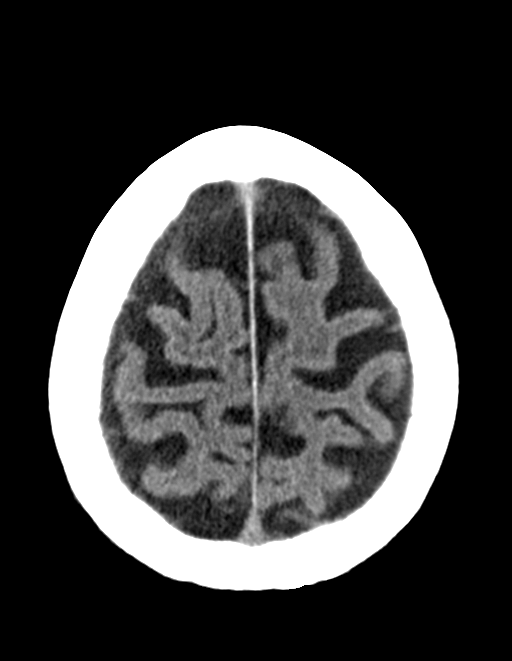
[im 29/33  brain]
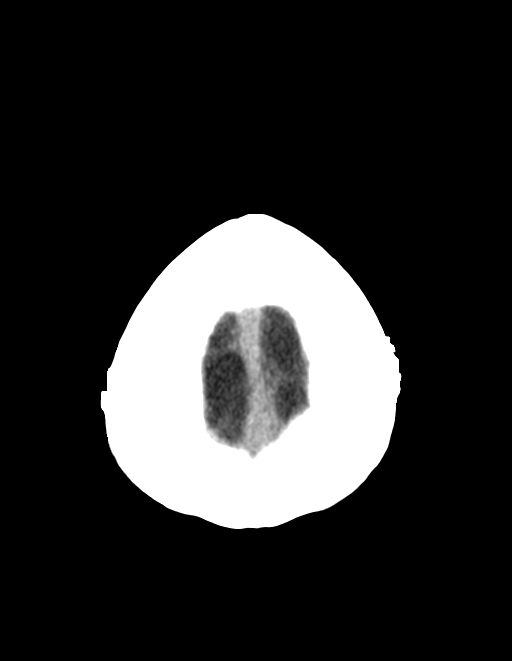

[Series 5: coronal soft · coronal · 0.31mm/px · 3 of 69 slices shown]
[im 23/69  brain]
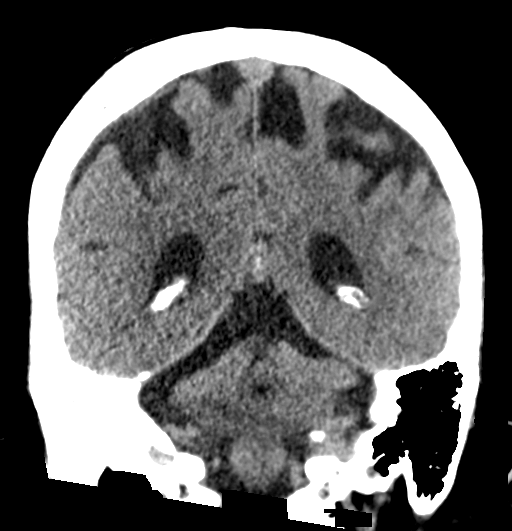
[im 31/69  brain]
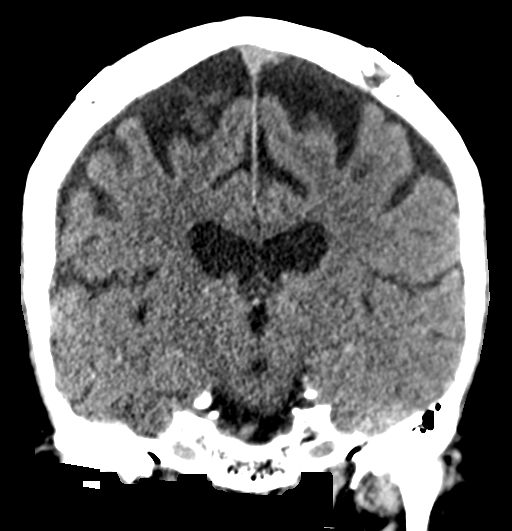
[im 38/69  brain]
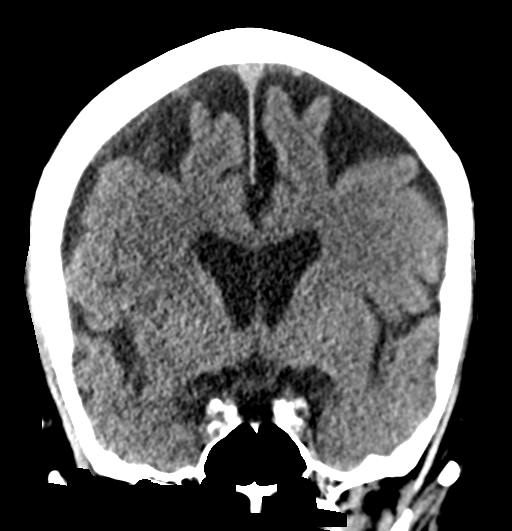

[Series 6: sagittal soft · sagittal · 0.32mm/px · 3 of 53 slices shown]
[im 20/53  brain]
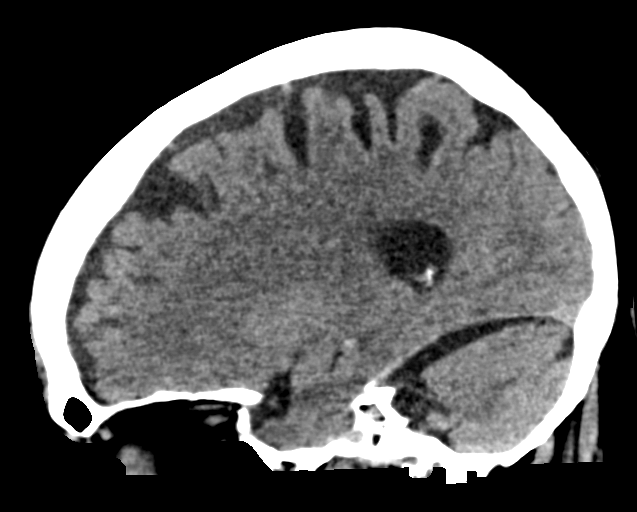
[im 27/53  brain]
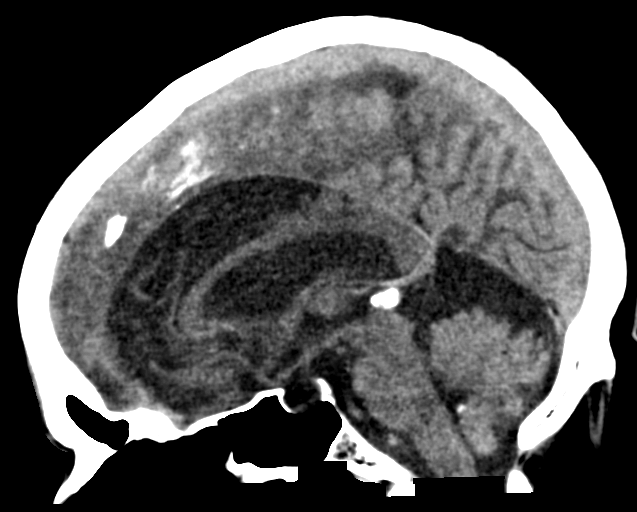
[im 33/53  brain]
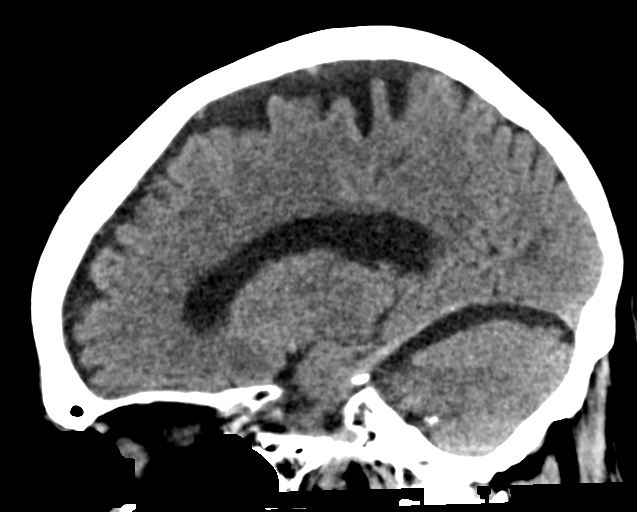

[13 of 47 positions shown; findings below may reference images not displayed]

FINDINGS: CT HEAD FINDINGS

Brain: No evidence of acute infarction, hemorrhage, hydrocephalus,
extra-axial collection or mass lesion/mass effect. Low-density
extra-axial fluid collection in the right frontal region measuring 1
cm appears unchanged from prior. Small area of encephalomalacia in
the right frontal lobe is also unchanged. There is mild diffuse
atrophy.

Vascular: Atherosclerotic calcifications are present within the
cavernous internal carotid arteries.

Skull: Old bilateral frontoparietal burr holes are present. No acute
fractures are seen.

Other: None.

CT MAXILLOFACIAL FINDINGS

Osseous: No fracture or mandibular dislocation. No destructive
process.

Orbits: Negative. No traumatic or inflammatory finding.

Sinuses: Right mastoidectomy changes are again noted. There is
opacification residual right mastoid air cells, unchanged. Left
mastoid air cells clear. Paranasal sinuses are clear.

Soft tissues: There is mild soft tissue swelling and edema of the
right face. There is also soft tissue swelling and edema in the
right forehead and right periorbital region. There is no radiopaque
foreign body.

CT CERVICAL SPINE FINDINGS

Alignment: Normal.

Skull base and vertebrae: No acute fracture. No primary bone lesion
or focal pathologic process.

Soft tissues and spinal canal: No prevertebral fluid or swelling. No
visible canal hematoma.

Disc levels: There is disc space narrowing from C3 through C7
compatible with degenerative change. There is no severe central
canal or neural foraminal stenosis identified.

Upper chest: Negative.

Other: None.
IMPRESSION: 1. No acute intracranial process.
2. No acute facial fracture.
3. Right facial soft tissue swelling and edema.
4. No acute fracture or malalignment of the cervical spine.
5. Unchanged small low-density extra-axial fluid collection
overlying the right cerebral convexity may represent subdural
hygroma.

## 2024-02-12 DEATH — deceased
